# Patient Record
Sex: Female | Born: 1935 | Race: White | Hispanic: No | Smoking: Current every day smoker
Health system: Southern US, Community
[De-identification: ages and names within clinical notes are randomized; demographics above are authoritative.]

## PROBLEM LIST (undated history)

## (undated) DIAGNOSIS — M1990 Unspecified osteoarthritis, unspecified site: Secondary | ICD-10-CM

## (undated) DIAGNOSIS — F419 Anxiety disorder, unspecified: Secondary | ICD-10-CM

## (undated) DIAGNOSIS — R519 Headache, unspecified: Secondary | ICD-10-CM

## (undated) DIAGNOSIS — F0390 Unspecified dementia without behavioral disturbance: Secondary | ICD-10-CM

## (undated) DIAGNOSIS — I10 Essential (primary) hypertension: Secondary | ICD-10-CM

## (undated) DIAGNOSIS — R51 Headache: Secondary | ICD-10-CM

## (undated) DIAGNOSIS — F329 Major depressive disorder, single episode, unspecified: Secondary | ICD-10-CM

## (undated) DIAGNOSIS — R251 Tremor, unspecified: Secondary | ICD-10-CM

## (undated) DIAGNOSIS — N189 Chronic kidney disease, unspecified: Secondary | ICD-10-CM

## (undated) HISTORY — PX: PTERYGIUM EXCISION: SHX2273

## (undated) HISTORY — PX: ABDOMINAL HYSTERECTOMY: SHX81

## (undated) HISTORY — PX: HEMORROIDECTOMY: SUR656

## (undated) HISTORY — PX: FOOT SURGERY: SHX648

---

## 2008-06-01 ENCOUNTER — Emergency Department (HOSPITAL_BASED_OUTPATIENT_CLINIC_OR_DEPARTMENT_OTHER): Admission: EM | Admit: 2008-06-01 | Discharge: 2008-06-01 | Payer: Self-pay | Admitting: Emergency Medicine

## 2009-08-24 ENCOUNTER — Ambulatory Visit: Payer: Self-pay | Admitting: Diagnostic Radiology

## 2009-08-24 ENCOUNTER — Encounter: Payer: Self-pay | Admitting: Family Medicine

## 2009-08-25 ENCOUNTER — Observation Stay (HOSPITAL_COMMUNITY): Admission: EM | Admit: 2009-08-25 | Discharge: 2009-08-25 | Payer: Self-pay | Admitting: Internal Medicine

## 2010-10-13 LAB — CBC
Hemoglobin: 13 g/dL (ref 12.0–15.0)
MCHC: 34.3 g/dL (ref 30.0–36.0)
RBC: 4.31 MIL/uL (ref 3.87–5.11)
WBC: 8.4 10*3/uL (ref 4.0–10.5)

## 2010-10-13 LAB — POCT CARDIAC MARKERS
CKMB, poc: <1 ng/mL — ABNORMAL LOW (ref 1.0–8.0)
Myoglobin, poc: 49.9 ng/mL (ref 12–200)
Myoglobin, poc: 66.6 ng/mL (ref 12–200)
Troponin i, poc: <0.05 ng/mL (ref 0.00–0.09)

## 2010-10-13 LAB — BASIC METABOLIC PANEL
CO2: 30 mEq/L (ref 19–32)
Calcium: 10.7 mg/dL — ABNORMAL HIGH (ref 8.4–10.5)
Creatinine, Ser: 1 mg/dL (ref 0.4–1.2)
Glucose, Bld: 106 mg/dL — ABNORMAL HIGH (ref 70–99)

## 2010-10-14 LAB — LIPID PANEL
HDL: 68 mg/dL (ref >39)
LDL Cholesterol: 144 mg/dL — ABNORMAL HIGH (ref 0–99)
Triglycerides: 96 mg/dL (ref <150)
VLDL: 19 mg/dL (ref 0–40)

## 2010-10-14 LAB — CK TOTAL AND CKMB (NOT AT ARMC)
Relative Index: INVALID (ref 0.0–2.5)
Relative Index: INVALID (ref 0.0–2.5)
Relative Index: INVALID (ref 0.0–2.5)
Total CK: 24 U/L (ref 7–177)
Total CK: 27 U/L (ref 7–177)

## 2010-10-14 LAB — CBC
MCHC: 34 g/dL (ref 30.0–36.0)
MCV: 90.4 fL (ref 78.0–100.0)
Platelets: 287 10*3/uL (ref 150–400)
RBC: 4 MIL/uL (ref 3.87–5.11)
WBC: 6.9 10*3/uL (ref 4.0–10.5)

## 2010-10-14 LAB — BASIC METABOLIC PANEL
BUN: 16 mg/dL (ref 6–23)
CO2: 30 mEq/L (ref 19–32)
Calcium: 10.2 mg/dL (ref 8.4–10.5)
Creatinine, Ser: 0.92 mg/dL (ref 0.4–1.2)
GFR calc Af Amer: >60 mL/min (ref >60)

## 2010-10-14 LAB — TROPONIN I
Troponin I: 0.02 ng/mL (ref 0.00–0.06)
Troponin I: 0.02 ng/mL (ref 0.00–0.06)

## 2010-10-14 LAB — TSH: TSH: 1.051 u[IU]/mL (ref 0.350–4.500)

## 2011-04-29 LAB — POCT TOXICOLOGY PANEL

## 2012-01-31 ENCOUNTER — Emergency Department (HOSPITAL_COMMUNITY)
Admission: EM | Admit: 2012-01-31 | Discharge: 2012-02-01 | Disposition: A | Discharge status: Skilled Nursing Facility | Payer: Medicare Other | Attending: Emergency Medicine | Admitting: Emergency Medicine

## 2012-01-31 ENCOUNTER — Encounter (HOSPITAL_COMMUNITY): Payer: Self-pay | Admitting: Emergency Medicine

## 2012-01-31 DIAGNOSIS — S0003XA Contusion of scalp, initial encounter: Secondary | ICD-10-CM | POA: Insufficient documentation

## 2012-01-31 DIAGNOSIS — S1093XA Contusion of unspecified part of neck, initial encounter: Secondary | ICD-10-CM | POA: Insufficient documentation

## 2012-01-31 DIAGNOSIS — I10 Essential (primary) hypertension: Secondary | ICD-10-CM | POA: Insufficient documentation

## 2012-01-31 DIAGNOSIS — M1990 Unspecified osteoarthritis, unspecified site: Secondary | ICD-10-CM | POA: Insufficient documentation

## 2012-01-31 DIAGNOSIS — Z79899 Other long term (current) drug therapy: Secondary | ICD-10-CM | POA: Insufficient documentation

## 2012-01-31 DIAGNOSIS — M542 Cervicalgia: Secondary | ICD-10-CM | POA: Insufficient documentation

## 2012-01-31 DIAGNOSIS — W010XXA Fall on same level from slipping, tripping and stumbling without subsequent striking against object, initial encounter: Secondary | ICD-10-CM | POA: Insufficient documentation

## 2012-01-31 DIAGNOSIS — S0990XA Unspecified injury of head, initial encounter: Secondary | ICD-10-CM | POA: Insufficient documentation

## 2012-01-31 DIAGNOSIS — Y9210 Unspecified residential institution as the place of occurrence of the external cause: Secondary | ICD-10-CM | POA: Insufficient documentation

## 2012-01-31 DIAGNOSIS — F0390 Unspecified dementia without behavioral disturbance: Secondary | ICD-10-CM | POA: Insufficient documentation

## 2012-01-31 DIAGNOSIS — S20229A Contusion of unspecified back wall of thorax, initial encounter: Secondary | ICD-10-CM

## 2012-01-31 HISTORY — DX: Anxiety disorder, unspecified: F41.9

## 2012-01-31 HISTORY — DX: Unspecified dementia, unspecified severity, without behavioral disturbance, psychotic disturbance, mood disturbance, and anxiety: F03.90

## 2012-01-31 HISTORY — DX: Major depressive disorder, single episode, unspecified: F32.9

## 2012-01-31 HISTORY — DX: Essential (primary) hypertension: I10

## 2012-01-31 HISTORY — DX: Unspecified osteoarthritis, unspecified site: M19.90

## 2012-01-31 HISTORY — DX: Headache, unspecified: R51.9

## 2012-01-31 HISTORY — DX: Tremor, unspecified: R25.1

## 2012-01-31 HISTORY — DX: Headache: R51

## 2012-01-31 NOTE — ED Notes (Signed)
YQM:[id number]<ON> Expected date:01/31/12<BR> Expected time:10:18 PM<BR> Means of arrival:Ambulance<BR> Comments:<BR> Fall, head injury

## 2012-01-31 NOTE — ED Notes (Signed)
Per EMS pt transported from [facility] after being found on ground against wall, pt noted to have hematoma to back of head. Pt in KED by EMS, towel roll in place for cspine immobilization. A & O

## 2012-02-01 ENCOUNTER — Emergency Department (HOSPITAL_COMMUNITY): Payer: Medicare Other

## 2012-02-01 MED ORDER — KETOROLAC TROMETHAMINE 60 MG/2ML IM SOLN
60.0000 mg | Freq: Once | INTRAMUSCULAR | Status: AC
  Administered 2012-02-01: 60 mg via INTRAMUSCULAR
  Filled 2012-02-01: qty 2

## 2012-02-01 MED ORDER — NAPROXEN 500 MG PO TABS: 500.0000 mg | ORAL_TABLET | Freq: Two times a day (BID) | ORAL | Status: AC

## 2012-02-01 NOTE — ED Notes (Signed)
Pt ambulates with assistance of one, MD made aware

## 2012-02-01 NOTE — ED Notes (Signed)
Pt states she was out of bed to close blinds, tripped and fell against cement wall, hematoma noted to back of head. Pt denies LOC. Pt awake, able to answer questions. C/o head and back pain

## 2012-02-01 NOTE — ED Provider Notes (Signed)
History     CSN: [id number]  Arrival date & time 01/31/12  2222   First MD Initiated Contact with Patient 02/01/12 [phone]      Chief Complaint  Patient presents with  . Fall    (Consider location/radiation/quality/duration/timing/severity/associated sxs/prior treatment) HPI Comments: Patient is an elderly 76 year old female with a history of dementia, osteoarthritis and hypertension who presents after slipping and falling backwards striking her head and mid back on a cement wall and then falling to the floor. She states this was a mechanical, she lost her balance, was able to call for help. She was found on the floor of her assisted living facility able to use both her arms and her legs but having too much pain to get up off the floor. This was acute in onset, pain has been persistent, she denies numbness or weakness of her arms or legs or change in vision. She was immobilized by paramedics with a c-collar and a modified backboard  Patient is a 76 y.o. female presenting with fall. The history is provided by the patient, medical records and the EMS personnel.  Fall    Past Medical History  Diagnosis Date  . Dementia   . Anxiety   . MDD (major depressive disorder)   . Hypertension   . Head ache   . Tremor   . Osteoarthritis     Past Surgical History  Procedure Date  . Abdominal hysterectomy   . Hemorroidectomy   . Pterygium excision   . Foot surgery     right    No family history on file.  History  Substance Use Topics  . Smoking status: Unknown If Ever Smoked  . Smokeless tobacco: Not on file  . Alcohol Use: No    OB History    Grav Para Term Preterm Abortions TAB SAB Ect Mult Living                  Review of Systems  Unable to perform ROS: Dementia    Allergies  Review of patient's allergies indicates no known allergies.  Home Medications   Current Outpatient Rx  Name Route Sig Dispense Refill  . ACETAMINOPHEN 500 MG PO TABS Oral Take 500 mg by mouth  every 4 (four) hours as needed. For pain    . ALUM & MAG HYDROXIDE-SIMETH 200-200-20 MG/5ML PO SUSP Oral Take 30 mLs by mouth every 6 (six) hours as needed. For indigestion    . ARIPIPRAZOLE 2 MG PO TABS Oral Take 2 mg by mouth daily.    Marland Kitchen BACLOFEN 10 MG PO TABS Oral Take 10 mg by mouth daily.    . CELECOXIB 200 MG PO CAPS Oral Take 200 mg by mouth daily.    Marland Kitchen CLONAZEPAM 0.5 MG PO TABS Oral Take 0.5 mg by mouth 4 (four) times daily.    Marland Kitchen DOCUSATE SODIUM 100 MG PO CAPS Oral Take 100 mg by mouth daily as needed. For constipation    . DONEPEZIL HCL 5 MG PO TABS Oral Take 5 mg by mouth daily.    . DULOXETINE HCL 30 MG PO CPEP Oral Take 90 mg by mouth daily.    . GUAIFENESIN-CODEINE 100-10 MG/5ML PO SYRP Oral Take 10 mLs by mouth 4 (four) times daily as needed.    Marland Kitchen LAMOTRIGINE 100 MG PO TABS Oral Take 100 mg by mouth daily.    Marland Kitchen LOSARTAN POTASSIUM-HCTZ 50-12.5 MG PO TABS Oral Take 1 tablet by mouth daily.    . MECLIZINE HCL  25 MG PO TABS Oral Take 25 mg by mouth 3 (three) times daily as needed. For dizziness    . MEMANTINE HCL 10 MG PO TABS Oral Take 10 mg by mouth daily.    Marland Kitchen VITAMIN D (ERGOCALCIFEROL) 50000 UNITS PO CAPS Oral Take 50,000 Units by mouth every 7 (seven) days.    Marland Kitchen NAPROXEN 500 MG PO TABS Oral Take 1 tablet (500 mg total) by mouth 2 (two) times daily with a meal. 30 tablet 0    BP 129/55  Pulse 69  Temp 97.8 F (36.6 C) (Oral)  Resp 14  SpO2 93%  Physical Exam  Nursing note and vitals reviewed. Constitutional: She appears well-developed and well-nourished. No distress.  HENT:  Head: Normocephalic.  Mouth/Throat: Oropharynx is clear and moist. No oropharyngeal exudate.       Small hematoma to the posterior occiput, tympanic membranes clear, no hemotympanum, no raccoon eyes, no battle sign  Eyes: Conjunctivae and EOM are normal. Pupils are equal, round, and reactive to light. Right eye exhibits no discharge. Left eye exhibits no discharge. No scleral icterus.  Neck: No JVD  present.  Cardiovascular: Normal rate, regular rhythm, normal heart sounds and intact distal pulses.  Exam reveals no gallop and no friction rub.   No murmur heard. Pulmonary/Chest: Effort normal and breath sounds normal. No respiratory distress. She has no wheezes. She has no rales.  Abdominal: Soft. Bowel sounds are normal. She exhibits no distension and no mass. There is no tenderness.  Musculoskeletal: Normal range of motion. She exhibits tenderness ( Mild tenderness over the cervical spine). She exhibits no edema.  Lymphadenopathy:    She has no cervical adenopathy.  Neurological: She is alert. Coordination normal.       Alert, follows commands without difficulty, normal strength and sensation of the bilateral upper and lower extremities.  Skin: Skin is warm and dry. No rash noted. No erythema.  Psychiatric: She has a normal mood and affect. Her behavior is normal.    ED Course  Procedures (including critical care time)  Labs Reviewed - No data to display Ct Head Wo Contrast  02/01/2012  *RADIOLOGY REPORT*  Clinical Data:  Status post fall; scalp hematoma at the back of the head.  Headache and neck pain.  CT HEAD WITHOUT CONTRAST AND CT CERVICAL SPINE WITHOUT CONTRAST  Technique:  Multidetector CT imaging of the head and cervical spine was performed following the standard protocol without intravenous contrast.  Multiplanar CT image reconstructions of the cervical spine were also generated.  Comparison: None  CT HEAD  Findings: There is no evidence of acute infarction, mass lesion, or intra- or extra-axial hemorrhage on CT.  The posterior fossa, including the cerebellum, brainstem and fourth ventricle, is within normal limits.  The third and lateral ventricles, and basal ganglia are unremarkable in appearance.  The cerebral hemispheres are symmetric in appearance, with normal gray- white differentiation.  No mass effect or midline shift is seen.  There is no evidence of fracture; visualized  osseous structures are unremarkable in appearance.  The visualized portions of the orbits are within normal limits.  The paranasal sinuses and mastoid air cells are well-aerated.  Mild soft tissue swelling is noted at the occiput.  IMPRESSION:  1.  No evidence of traumatic intracranial injury or fracture. 2.  Mild soft tissue swelling noted at the occiput.  CT CERVICAL SPINE  Findings: There is no evidence of fracture or subluxation. Vertebral bodies demonstrate normal height and alignment.  There is  narrowing of the intervertebral disc space at C4-C5, C5-C6 and C6- C7, with small anterior and posterior disc osteophyte complexes. Prevertebral soft tissues are within normal limits.  The visualized portions of the thyroid gland are unremarkable in appearance.  The minimally visualized lung apices are clear.  No significant soft tissue abnormalities are seen.  IMPRESSION:  1.  No evidence of fracture or subluxation along the cervical spine. 2.  Mild degenerative change noted along the lower cervical spine.  Original Report Authenticated By: Tonia Ghent, M.D.   Ct Cervical Spine Wo Contrast  02/01/2012  *RADIOLOGY REPORT*  Clinical Data:  Status post fall; scalp hematoma at the back of the head.  Headache and neck pain.  CT HEAD WITHOUT CONTRAST AND CT CERVICAL SPINE WITHOUT CONTRAST  Technique:  Multidetector CT imaging of the head and cervical spine was performed following the standard protocol without intravenous contrast.  Multiplanar CT image reconstructions of the cervical spine were also generated.  Comparison: None  CT HEAD  Findings: There is no evidence of acute infarction, mass lesion, or intra- or extra-axial hemorrhage on CT.  The posterior fossa, including the cerebellum, brainstem and fourth ventricle, is within normal limits.  The third and lateral ventricles, and basal ganglia are unremarkable in appearance.  The cerebral hemispheres are symmetric in appearance, with normal gray- white  differentiation.  No mass effect or midline shift is seen.  There is no evidence of fracture; visualized osseous structures are unremarkable in appearance.  The visualized portions of the orbits are within normal limits.  The paranasal sinuses and mastoid air cells are well-aerated.  Mild soft tissue swelling is noted at the occiput.  IMPRESSION:  1.  No evidence of traumatic intracranial injury or fracture. 2.  Mild soft tissue swelling noted at the occiput.  CT CERVICAL SPINE  Findings: There is no evidence of fracture or subluxation. Vertebral bodies demonstrate normal height and alignment.  There is narrowing of the intervertebral disc space at C4-C5, C5-C6 and C6- C7, with small anterior and posterior disc osteophyte complexes. Prevertebral soft tissues are within normal limits.  The visualized portions of the thyroid gland are unremarkable in appearance.  The minimally visualized lung apices are clear.  No significant soft tissue abnormalities are seen.  IMPRESSION:  1.  No evidence of fracture or subluxation along the cervical spine. 2.  Mild degenerative change noted along the lower cervical spine.  Original Report Authenticated By: Tonia Ghent, M.D.     1. Head injury   2. Contusion of back       MDM  Maintaining cervical immobilization, imaging of head and neck, no reproducible tenderness over the thoracic or lumbar spines. Neurologic status is intact, pain medication ordered  Patient is ambulated with minimal difficulty, has CT scan showing no signs of intracranial injury or cervical spine injury. Patient neurologically intact and amenable to discharge, lives in assisted care facility, axis to help as needed.      Vida Roller, MD 02/01/12 [phone]

## 2012-02-01 NOTE — ED Notes (Signed)
[facility] contacted for transport 

## 2012-11-03 ENCOUNTER — Other Ambulatory Visit: Payer: Self-pay

## 2012-11-03 DIAGNOSIS — Z1231 Encounter for screening mammogram for malignant neoplasm of breast: Secondary | ICD-10-CM

## 2012-11-16 ENCOUNTER — Ambulatory Visit: Payer: Medicare Other

## 2013-10-18 ENCOUNTER — Emergency Department (HOSPITAL_COMMUNITY)
Admission: EM | Admit: 2013-10-18 | Discharge: 2013-10-18 | Disposition: A | Discharge status: Home / Self Care | Payer: Medicare Other | Attending: Emergency Medicine | Admitting: Emergency Medicine

## 2013-10-18 ENCOUNTER — Emergency Department (HOSPITAL_COMMUNITY): Payer: Medicare Other

## 2013-10-18 ENCOUNTER — Encounter (HOSPITAL_COMMUNITY): Payer: Self-pay | Admitting: Emergency Medicine

## 2013-10-18 DIAGNOSIS — F411 Generalized anxiety disorder: Secondary | ICD-10-CM | POA: Insufficient documentation

## 2013-10-18 DIAGNOSIS — Z7901 Long term (current) use of anticoagulants: Secondary | ICD-10-CM | POA: Insufficient documentation

## 2013-10-18 DIAGNOSIS — Y9389 Activity, other specified: Secondary | ICD-10-CM | POA: Insufficient documentation

## 2013-10-18 DIAGNOSIS — F329 Major depressive disorder, single episode, unspecified: Secondary | ICD-10-CM | POA: Insufficient documentation

## 2013-10-18 DIAGNOSIS — W19XXXA Unspecified fall, initial encounter: Secondary | ICD-10-CM

## 2013-10-18 DIAGNOSIS — S59919A Unspecified injury of unspecified forearm, initial encounter: Principal | ICD-10-CM

## 2013-10-18 DIAGNOSIS — Z79899 Other long term (current) drug therapy: Secondary | ICD-10-CM | POA: Insufficient documentation

## 2013-10-18 DIAGNOSIS — Y9210 Unspecified residential institution as the place of occurrence of the external cause: Secondary | ICD-10-CM | POA: Insufficient documentation

## 2013-10-18 DIAGNOSIS — S59909A Unspecified injury of unspecified elbow, initial encounter: Secondary | ICD-10-CM | POA: Insufficient documentation

## 2013-10-18 DIAGNOSIS — I10 Essential (primary) hypertension: Secondary | ICD-10-CM | POA: Insufficient documentation

## 2013-10-18 DIAGNOSIS — S6990XA Unspecified injury of unspecified wrist, hand and finger(s), initial encounter: Principal | ICD-10-CM | POA: Insufficient documentation

## 2013-10-18 DIAGNOSIS — R296 Repeated falls: Secondary | ICD-10-CM | POA: Insufficient documentation

## 2013-10-18 DIAGNOSIS — Z7983 Long term (current) use of bisphosphonates: Secondary | ICD-10-CM | POA: Insufficient documentation

## 2013-10-18 DIAGNOSIS — M25521 Pain in right elbow: Secondary | ICD-10-CM

## 2013-10-18 DIAGNOSIS — M1990 Unspecified osteoarthritis, unspecified site: Secondary | ICD-10-CM | POA: Insufficient documentation

## 2013-10-18 DIAGNOSIS — F0390 Unspecified dementia without behavioral disturbance: Secondary | ICD-10-CM | POA: Insufficient documentation

## 2013-10-18 NOTE — ED Notes (Signed)
[facility] called for transport back to SNF.  Report given to nurse at the SNF by phone.

## 2013-10-18 NOTE — ED Notes (Signed)
Bed: WA08 Expected date:  Expected time:  Means of arrival:  Comments: EMS-fall-right elbow pain

## 2013-10-18 NOTE — ED Provider Notes (Signed)
TIME SEEN: 1:21 PM  CHIEF COMPLAINT: Right elbow pain  HPI: Patient is a 78 year old right-hand-dominant female with a history of mild dementia, hypertension who lives in a nursing facility who reports that today at her nursing facility she was stating he began to lean back against a door when she fell and landed on her right elbow. She states that she miscalculated and thought the door was closer to her than it really was. She denies hitting her head or losing consciousness. She is on anticoagulation. Denies that she had any chest pain, shortness of breath, palpitations or dizziness that led to her fall. Her recent infectious symptoms. She is only complaining of very mild right elbow pain.   ROS: See HPI Constitutional: no fever  Eyes: no drainage  ENT: no runny nose   Cardiovascular:  no chest pain  Resp: no SOB  GI: no vomiting GU: no dysuria Integumentary: no rash  Allergy: no hives  Musculoskeletal: no leg swelling  Neurological: no slurred speech ROS otherwise negative  PAST MEDICAL HISTORY/PAST SURGICAL HISTORY:  Past Medical History  Diagnosis Date  . Dementia   . Anxiety   . MDD (major depressive disorder)   . Hypertension   . Head ache   . Tremor   . Osteoarthritis     MEDICATIONS:  Prior to Admission medications   Medication Sig Start Date End Date Taking? Authorizing Provider  acetaminophen (TYLENOL) 500 MG tablet Take 500 mg by mouth every 4 (four) hours as needed. For pain   Yes Historical Provider, MD  alendronate (FOSAMAX) 70 MG tablet Take 70 mg by mouth once a week. Take with a full glass of water on an empty stomach. Takes on Mondays   Yes Historical Provider, MD  alum & mag hydroxide-simeth (MAALOX/MYLANTA) 200-200-20 MG/5ML suspension Take 30 mLs by mouth every 6 (six) hours as needed. For indigestion   Yes Historical Provider, MD  Calcium Carbonate-Vitamin D (CALCARB 600/D) 600-400 MG-UNIT per tablet Take 1 tablet by mouth daily.   Yes Historical Provider,  MD  clonazePAM (KLONOPIN) 1 MG tablet Take 1 mg by mouth daily.   Yes Historical Provider, MD  Dextromethorphan-Quinidine (NUEDEXTA) 20-10 MG CAPS Take 1 capsule by mouth every 12 (twelve) hours.   Yes Historical Provider, MD  divalproex (DEPAKOTE) 250 MG DR tablet Take 250 mg by mouth 3 (three) times daily. 250mg  in the morning, 250mg  at 2pm, and 250mg  in the evening   Yes Historical Provider, MD  docusate sodium (COLACE) 100 MG capsule Take 100 mg by mouth 2 (two) times daily. For constipation   Yes Historical Provider, MD  DULoxetine (CYMBALTA) 60 MG capsule Take 60 mg by mouth 2 (two) times daily.   Yes Historical Provider, MD  gabapentin (NEURONTIN) 600 MG tablet Take 600 mg by mouth 4 (four) times daily.   Yes Historical Provider, MD  guaiFENesin-codeine (IOPHEN C-NR) 100-10 MG/5ML syrup Take 10 mLs by mouth 4 (four) times daily as needed.   Yes Historical Provider, MD  HYDROcodone-acetaminophen (NORCO/VICODIN) 5-325 MG per tablet Take 1 tablet by mouth 4 (four) times daily as needed for moderate pain.   Yes Historical Provider, MD  loperamide (IMODIUM) 2 MG capsule Take 2 mg by mouth as needed for diarrhea or loose stools.   Yes Historical Provider, MD  losartan-hydrochlorothiazide (HYZAAR) 50-12.5 MG per tablet Take 1 tablet by mouth daily.   Yes Historical Provider, MD  lurasidone (LATUDA) 40 MG TABS tablet Take 40 mg by mouth daily with breakfast.  Yes Historical Provider, MD  magnesium hydroxide (MILK OF MAGNESIA) 400 MG/5ML suspension Take 30 mLs by mouth daily as needed for mild constipation.   Yes Historical Provider, MD  Melatonin 3 MG TABS Take 3 mg by mouth at bedtime.   Yes Historical Provider, MD  Memantine HCl ER (NAMENDA XR) 28 MG CP24 Take 28 mg by mouth daily.   Yes Historical Provider, MD  naproxen (NAPROSYN) 500 MG tablet Take 500 mg by mouth 2 (two) times daily as needed for mild pain.   Yes Historical Provider, MD  omeprazole (PRILOSEC) 20 MG capsule Take 20 mg by mouth  daily after breakfast.   Yes Historical Provider, MD  QUEtiapine (SEROQUEL) 25 MG tablet Take 25 mg by mouth at bedtime.   Yes Historical Provider, MD  traMADol (ULTRAM) 50 MG tablet Take 50 mg by mouth every 6 (six) hours as needed for moderate pain.   Yes Historical Provider, MD    ALLERGIES:  No Known Allergies  SOCIAL HISTORY:  History  Substance Use Topics  . Smoking status: Unknown If Ever Smoked  . Smokeless tobacco: Not on file  . Alcohol Use: No    FAMILY HISTORY: History reviewed. No pertinent family history.  EXAM: BP 120/50  Pulse 82  Temp(Src) 98.3 F (36.8 C) (Oral)  Resp 16  SpO2 95% CONSTITUTIONAL: Alert and oriented x3 and responds appropriately to questions. Well-appearing; well-nourished HEAD: Normocephalic EYES: Conjunctivae clear, PERRL ENT: normal nose; no rhinorrhea; moist mucous membranes; pharynx without lesions noted NECK: Supple, no meningismus, no LAD  CARD: RRR; S1 and S2 appreciated; no murmurs, no clicks, no rubs, no gallops RESP: Normal chest excursion without splinting or tachypnea; breath sounds clear and equal bilaterally; no wheezes, no rhonchi, no rales,  ABD/GI: Normal bowel sounds; non-distended; soft, non-tender, no rebound, no guarding BACK:  The back appears normal and is non-tender to palpation, there is no CVA tenderness EXT: Normal ROM in all joints; non-tender to palpation; no edema; normal capillary refill; no cyanosis; small abrasion to the posterior right elbow with no joint effusion, no tenderness over the radial head, no scaphoid tenderness, full range of motion in all joints, 2+ radial pulses bilaterally SKIN: Normal color for age and race; warm NEURO: Moves all extremities equally, sensation to light touch intact diffusely, cranial nerves II through XII intact PSYCH: The patient's mood and manner are appropriate. Grooming and personal hygiene are appropriate.  MEDICAL DECISION MAKING: Patient here with mechanical fall with  right elbow pain. She is neurologically intact and hemodynamically stable. We'll obtain an x-ray of her right elbow. Patient denies wanting pain medication.   ED PROGRESS: Patient's x-ray shows no fracture dislocation. We'll discharge patient back to her nursing facility. Discussed supportive care instructions and return precautions. Patient verbalizes understanding and is comfortable with plan.     [name] MawKristen N Khiyan Crace, DO 10/18/13 1428

## 2013-10-18 NOTE — Discharge Instructions (Signed)
RICE: Routine Care for Injuries The routine care of many injuries includes Rest, Ice, Compression, and Elevation (RICE). HOME CARE INSTRUCTIONS  Rest is needed to allow your body to heal. Routine activities can usually be resumed when comfortable. Injured tendons and bones can take up to 6 weeks to heal. Tendons are the cord-like structures that attach muscle to bone.  Ice following an injury helps keep the swelling down and reduces pain.  Put ice in a plastic bag.  Place a towel between your skin and the bag.  Leave the ice on for 15-20 minutes, 03-04 times a day. Do this while awake, for the first 24 to 48 hours. After that, continue as directed by your caregiver.  Compression helps keep swelling down. It also gives support and helps with discomfort. If an elastic bandage has been applied, it should be removed and reapplied every 3 to 4 hours. It should not be applied tightly, but firmly enough to keep swelling down. Watch fingers or toes for swelling, bluish discoloration, coldness, numbness, or excessive pain. If any of these problems occur, remove the bandage and reapply loosely. Contact your caregiver if these problems continue.  Elevation helps reduce swelling and decreases pain. With extremities, such as the arms, hands, legs, and feet, the injured area should be placed near or above the level of the heart, if possible. SEEK IMMEDIATE MEDICAL CARE IF:  You have persistent pain and swelling.  You develop redness, numbness, or unexpected weakness.  Your symptoms are getting worse rather than improving after several days. These symptoms may indicate that further evaluation or further X-rays are needed. Sometimes, X-rays may not show a small broken bone (fracture) until 1 week or 10 days later. Make a follow-up appointment with your caregiver. Ask when your X-ray results will be ready. Make sure you get your X-ray results. Document Released: 10/26/2000 Document Revised: 10/06/2011  Document Reviewed: 12/13/2010 [facility] Patient Information 2014 [place], [place]. Contusion A contusion is a deep bruise. Contusions are the result of an injury that caused bleeding under the skin. The contusion may turn blue, purple, or yellow. Minor injuries will give you a painless contusion, but more severe contusions may stay painful and swollen for a few weeks.  CAUSES  A contusion is usually caused by a blow, trauma, or direct force to an area of the body. SYMPTOMS   Swelling and redness of the injured area.  Bruising of the injured area.  Tenderness and soreness of the injured area.  Pain. DIAGNOSIS  The diagnosis can be made by taking a history and physical exam. An X-ray, CT scan, or MRI may be needed to determine if there were any associated injuries, such as fractures. TREATMENT  Specific treatment will depend on what area of the body was injured. In general, the best treatment for a contusion is resting, icing, elevating, and applying cold compresses to the injured area. Over-the-counter medicines may also be recommended for pain control. Ask your caregiver what the best treatment is for your contusion. HOME CARE INSTRUCTIONS   Put ice on the injured area.  Put ice in a plastic bag.  Place a towel between your skin and the bag.  Leave the ice on for 15-20 minutes, 03-04 times a day.  Only take over-the-counter or prescription medicines for pain, discomfort, or fever as directed by your caregiver. Your caregiver may recommend avoiding anti-inflammatory medicines (aspirin, ibuprofen, and naproxen) for 48 hours because these medicines may increase bruising.  Rest the injured area.  If  possible, elevate the injured area to reduce swelling. SEEK IMMEDIATE MEDICAL CARE IF:   You have increased bruising or swelling.  You have pain that is getting worse.  Your swelling or pain is not relieved with medicines. MAKE SURE YOU:   Understand these instructions.  Will watch  your condition.  Will get help right away if you are not doing well or get worse. Document Released: 04/23/2005 Document Revised: 10/06/2011 Document Reviewed: 05/19/2011 [facility] Patient Information 2014 [place].

## 2013-10-18 NOTE — ED Notes (Signed)
Pt walked with 1 person assist for balance to bathroom.  NAD noted and pt was steady on feet.

## 2013-10-18 NOTE — ED Notes (Signed)
[facility]  Per EMS report, pt was walking out fo her room, thought she had closed the door and leaned back on it but it wasn't there to support her and she fell back on her RT elbow.  No LOC or hitting of head.  Pt didn't want to come to ED.  VS: 120/70, 80reg, 16, 100%RA, CBG 128.

## 2013-10-31 ENCOUNTER — Emergency Department (HOSPITAL_COMMUNITY)
Admission: EM | Admit: 2013-10-31 | Discharge: 2013-10-31 | Disposition: A | Discharge status: Home / Self Care | Payer: Medicare Other | Attending: Emergency Medicine | Admitting: Emergency Medicine

## 2013-10-31 ENCOUNTER — Emergency Department (HOSPITAL_COMMUNITY): Payer: Medicare Other

## 2013-10-31 ENCOUNTER — Encounter (HOSPITAL_COMMUNITY): Payer: Self-pay | Admitting: Emergency Medicine

## 2013-10-31 DIAGNOSIS — F0390 Unspecified dementia without behavioral disturbance: Secondary | ICD-10-CM | POA: Insufficient documentation

## 2013-10-31 DIAGNOSIS — M1990 Unspecified osteoarthritis, unspecified site: Secondary | ICD-10-CM | POA: Insufficient documentation

## 2013-10-31 DIAGNOSIS — W010XXA Fall on same level from slipping, tripping and stumbling without subsequent striking against object, initial encounter: Secondary | ICD-10-CM | POA: Insufficient documentation

## 2013-10-31 DIAGNOSIS — S0181XA Laceration without foreign body of other part of head, initial encounter: Secondary | ICD-10-CM

## 2013-10-31 DIAGNOSIS — Y9210 Unspecified residential institution as the place of occurrence of the external cause: Secondary | ICD-10-CM | POA: Insufficient documentation

## 2013-10-31 DIAGNOSIS — I10 Essential (primary) hypertension: Secondary | ICD-10-CM | POA: Insufficient documentation

## 2013-10-31 DIAGNOSIS — F329 Major depressive disorder, single episode, unspecified: Secondary | ICD-10-CM | POA: Insufficient documentation

## 2013-10-31 DIAGNOSIS — S01409A Unspecified open wound of unspecified cheek and temporomandibular area, initial encounter: Secondary | ICD-10-CM | POA: Insufficient documentation

## 2013-10-31 DIAGNOSIS — Y9389 Activity, other specified: Secondary | ICD-10-CM | POA: Insufficient documentation

## 2013-10-31 DIAGNOSIS — F411 Generalized anxiety disorder: Secondary | ICD-10-CM | POA: Insufficient documentation

## 2013-10-31 DIAGNOSIS — Z79899 Other long term (current) drug therapy: Secondary | ICD-10-CM | POA: Insufficient documentation

## 2013-10-31 LAB — I-STAT CHEM 8, ED
BUN: 9 mg/dL (ref 6–23)
CALCIUM ION: 1.3 mmol/L (ref 1.13–1.30)
Chloride: 93 mEq/L — ABNORMAL LOW (ref 96–112)
Creatinine, Ser: 0.9 mg/dL (ref 0.50–1.10)
GLUCOSE: 102 mg/dL — AB (ref 70–99)
HCT: 38 % (ref 36.0–46.0)
HEMOGLOBIN: 12.9 g/dL (ref 12.0–15.0)
Potassium: 3.9 mEq/L (ref 3.7–5.3)
Sodium: 130 mEq/L — ABNORMAL LOW (ref 137–147)
TCO2: 24 mmol/L (ref 0–100)

## 2013-10-31 MED ORDER — IOHEXOL 350 MG/ML SOLN
100.0000 mL | Freq: Once | INTRAVENOUS | Status: AC | PRN
  Administered 2013-10-31: 100 mL via INTRAVENOUS

## 2013-10-31 MED ORDER — HYDROCODONE-ACETAMINOPHEN 5-325 MG PO TABS
1.0000 | ORAL_TABLET | Freq: Once | ORAL | Status: AC
  Administered 2013-10-31: 1 via ORAL
  Filled 2013-10-31: qty 1

## 2013-10-31 NOTE — Discharge Instructions (Signed)
Facial Laceration ° A facial laceration is a cut on the face. These injuries can be painful and cause bleeding. Lacerations usually heal quickly, but they need special care to reduce scarring. °DIAGNOSIS  °Your health care provider will take a medical history, ask for details about how the injury occurred, and examine the wound to determine how deep the cut is. °TREATMENT  °Some facial lacerations may not require closure. Others may not be able to be closed because of an increased risk of infection. The risk of infection and the chance for successful closure will depend on various factors, including the amount of time since the injury occurred. °The wound may be cleaned to help prevent infection. If closure is appropriate, pain medicines may be given if needed. Your health care provider will use stitches (sutures), wound glue (adhesive), or skin adhesive strips to repair the laceration. These tools bring the skin edges together to allow for faster healing and a better cosmetic outcome. If needed, you may also be given a tetanus shot. °HOME CARE INSTRUCTIONS °· Only take over-the-counter or prescription medicines as directed by your health care provider. °· Follow your health care provider's instructions for wound care. These instructions will vary depending on the technique used for closing the wound. °For Sutures: °· Keep the wound clean and dry.   °· If you were given a bandage (dressing), you should change it at least once a day. Also change the dressing if it becomes wet or dirty, or as directed by your health care provider.   °· Wash the wound with soap and water 2 times a day. Rinse the wound off with water to remove all soap. Pat the wound dry with a clean towel.   °· After cleaning, apply a thin layer of the antibiotic ointment recommended by your health care provider. This will help prevent infection and keep the dressing from sticking.   °· You may shower as usual after the first 24 hours. Do not soak the  wound in water until the sutures are removed.   °· Get your sutures removed as directed by your health care provider. With facial lacerations, sutures should usually be taken out after 4 5 days to avoid stitch marks.   °· Wait a few days after your sutures are removed before applying any makeup. °For Skin Adhesive Strips: °· Keep the wound clean and dry.   °· Do not get the skin adhesive strips wet. You may bathe carefully, using caution to keep the wound dry.   °· If the wound gets wet, pat it dry with a clean towel.   °· Skin adhesive strips will fall off on their own. You may trim the strips as the wound heals. Do not remove skin adhesive strips that are still stuck to the wound. They will fall off in time.   °For Wound Adhesive: °· You may briefly wet your wound in the shower or bath. Do not soak or scrub the wound. Do not swim. Avoid periods of heavy sweating until the skin adhesive has fallen off on its own. After showering or bathing, gently pat the wound dry with a clean towel.   °· Do not apply liquid medicine, cream medicine, ointment medicine, or makeup to your wound while the skin adhesive is in place. This may loosen the film before your wound is healed.   °· If a dressing is placed over the wound, be careful not to apply tape directly over the skin adhesive. This may cause the adhesive to be pulled off before the wound is healed.   °·   Avoid prolonged exposure to sunlight or tanning lamps while the skin adhesive is in place. °· The skin adhesive will usually remain in place for 5 10 days, then naturally fall off the skin. Do not pick at the adhesive film.   °After Healing: °Once the wound has healed, cover the wound with sunscreen during the day for 1 full year. This can help minimize scarring. Exposure to ultraviolet light in the first year will darken the scar. It can take 1 2 years for the scar to lose its redness and to heal completely.  °SEEK IMMEDIATE MEDICAL CARE IF: °· You have redness, pain, or  swelling around the wound.   °· You see a yellowish-white fluid (pus) coming from the wound.   °· You have chills or a fever.   °MAKE SURE YOU: °· Understand these instructions. °· Will watch your condition. °· Will get help right away if you are not doing well or get worse. °Document Released: 08/21/2004 Document Revised: 05/04/2013 Document Reviewed: 02/24/2013 °[employer]® Patient Information ©2014 [employer]. ° °

## 2013-10-31 NOTE — ED Provider Notes (Signed)
Laceration repair:  LACERATION REPAIR Performed by: Elpidio AnisUPSTILL, [name] [name] Authorized by: Elpidio AnisUPSTILL, [name] [name] Consent: Verbal consent obtained. Risks and benefits: risks, benefits and alternatives were discussed Consent given by: patient Patient identity confirmed: provided demographic data Prepped and Draped in normal sterile fashion Wound explored  Laceration Location: rght anterior neck  Laceration Length: 4 cmcm  No Foreign Bodies seen or palpated  Anesthesia: local infiltration  Local anesthetic: lidocaine 2% w/ epinephrine  Anesthetic total: 2 ml  Irrigation method: syringe Amount of cleaning: standard  Skin closure: SQ Vicryl 6-0, dermabond  Number of sutures: 3  Technique: inverted, dermabond externally  Patient tolerance: Patient tolerated the procedure well with no immediate complications.   Arnoldo HookerShari [name] Janijah Symons, PA-C 10/31/13 1620

## 2013-10-31 NOTE — ED Notes (Signed)
Bed: WA21 Expected date:  Expected time:  Means of arrival:  Comments: ems- elderly, fall. laceration

## 2013-10-31 NOTE — ED Provider Notes (Signed)
CSN: [id number]     Arrival date & time 10/31/13  1155 History   First MD Initiated Contact with Patient 10/31/13 1215     Chief Complaint  Patient presents with  . Fall  . Facial Laceration    HPI Pt tripped over a trash can this morning and hit her face on the floor she thinks.  She sustained a laceration to her right lower cheek.  She did not lose consciousness.  Otherwise she feels fine.    She is not having any neck pain or other injuries.  She has not trouble closing her jaw.  She did not want to come to the ED but was sent in by the staff at her nursing facility. Past Medical History  Diagnosis Date  . Dementia   . Anxiety   . MDD (major depressive disorder)   . Hypertension   . Head ache   . Tremor   . Osteoarthritis    Past Surgical History  Procedure Laterality Date  . Abdominal hysterectomy    . Hemorroidectomy    . Pterygium excision    . Foot surgery      right   History reviewed. No pertinent family history. History  Substance Use Topics  . Smoking status: Unknown If Ever Smoked  . Smokeless tobacco: Not on file  . Alcohol Use: No   OB History   Grav Para Term Preterm Abortions TAB SAB Ect Mult Living                 Review of Systems  All other systems reviewed and are negative.      Allergies  Review of patient's allergies indicates no known allergies.  Home Medications   Current Outpatient Rx  Name  Route  Sig  Dispense  Refill  . alendronate (FOSAMAX) 70 MG tablet   Oral   Take 70 mg by mouth once a week. Take with a full glass of water on an empty stomach. Takes on Mondays         . Calcium Carbonate-Vitamin D (CALCARB 600/D) 600-400 MG-UNIT per tablet   Oral   Take 1 tablet by mouth daily.         . clonazePAM (KLONOPIN) 1 MG tablet   Oral   Take 1 mg by mouth daily.         . divalproex (DEPAKOTE) 250 MG DR tablet   Oral   Take 250 mg by mouth 3 (three) times daily. 500mg  in the morning, 250mg  at 2pm, and 500mg  in the  evening         . docusate sodium (COLACE) 100 MG capsule   Oral   Take 100 mg by mouth 2 (two) times daily. For constipation         . DULoxetine (CYMBALTA) 60 MG capsule   Oral   Take 60 mg by mouth 2 (two) times daily.         Marland Kitchen gabapentin (NEURONTIN) 600 MG tablet   Oral   Take 600 mg by mouth 4 (four) times daily.         Marland Kitchen losartan-hydrochlorothiazide (HYZAAR) 50-12.5 MG per tablet   Oral   Take 1 tablet by mouth daily.         Marland Kitchen lurasidone (LATUDA) 40 MG TABS tablet   Oral   Take 40 mg by mouth daily with breakfast.         . Melatonin 3 MG TABS   Oral   Take 3 mg  by mouth at bedtime.         Marland Kitchen acetaminophen (TYLENOL) 500 MG tablet   Oral   Take 500 mg by mouth every 4 (four) hours as needed. For pain         . alum & mag hydroxide-simeth (MAALOX/MYLANTA) 200-200-20 MG/5ML suspension   Oral   Take 30 mLs by mouth every 6 (six) hours as needed. For indigestion         . Dextromethorphan-Quinidine (NUEDEXTA) 20-10 MG CAPS   Oral   Take 1 capsule by mouth every 12 (twelve) hours.         Marland Kitchen guaiFENesin-codeine (IOPHEN C-NR) 100-10 MG/5ML syrup   Oral   Take 10 mLs by mouth 4 (four) times daily as needed.         Marland Kitchen HYDROcodone-acetaminophen (NORCO/VICODIN) 5-325 MG per tablet   Oral   Take 1 tablet by mouth 4 (four) times daily as needed for moderate pain.         Marland Kitchen loperamide (IMODIUM) 2 MG capsule   Oral   Take 2 mg by mouth as needed for diarrhea or loose stools.         . magnesium hydroxide (MILK OF MAGNESIA) 400 MG/5ML suspension   Oral   Take 30 mLs by mouth daily as needed for mild constipation.         . Memantine HCl ER (NAMENDA XR) 28 MG CP24   Oral   Take 28 mg by mouth daily.         . naproxen (NAPROSYN) 500 MG tablet   Oral   Take 500 mg by mouth 2 (two) times daily as needed for mild pain.         Marland Kitchen omeprazole (PRILOSEC) 20 MG capsule   Oral   Take 20 mg by mouth daily after breakfast.         .  QUEtiapine (SEROQUEL) 25 MG tablet   Oral   Take 25 mg by mouth at bedtime.         . traMADol (ULTRAM) 50 MG tablet   Oral   Take 50 mg by mouth every 6 (six) hours as needed for moderate pain.          BP 125/50  Pulse 90  Temp(Src) 98.4 F (36.9 C) (Oral)  Resp 16  SpO2 95% Physical Exam  Nursing note and vitals reviewed. Constitutional: She appears well-developed and well-nourished. No distress.  HENT:  Head: Normocephalic.  Right Ear: External ear normal.  Left Ear: External ear normal.  Laceration inferior to mandible on the right, submandibular region, wound appears to extend posteriorly, no active bleeding, no large hematoma, some subq crepitus  Eyes: Conjunctivae are normal. Right eye exhibits no discharge. Left eye exhibits no discharge. No scleral icterus.  Neck: Neck supple. No tracheal deviation present.  Cardiovascular: Normal rate, regular rhythm and intact distal pulses.   Pulmonary/Chest: Effort normal and breath sounds normal. No stridor. No respiratory distress. She has no wheezes. She has no rales.  Abdominal: Soft. Bowel sounds are normal. She exhibits no distension. There is no tenderness. There is no rebound and no guarding.  Musculoskeletal: She exhibits no edema and no tenderness.  Neurological: She is alert. She has normal strength. No cranial nerve deficit (no facial droop, extraocular movements intact, no slurred speech) or sensory deficit. She exhibits normal muscle tone. She displays no seizure activity. Coordination normal.  Skin: Skin is warm and dry. No rash noted.  Psychiatric: She has  a normal mood and affect.    ED Course  Procedures (including critical care time) Labs Review Labs Reviewed - No data to display Imaging Review Ct Angio Neck W/cm &/or Wo/cm  10/31/2013   CLINICAL DATA:  Larey SeatFell, laceration to right face. Amnestic for event. Dementia.  EXAM: CT ANGIOGRAPHY NECK  TECHNIQUE: Multidetector CT imaging of the neck was performed using  the standard protocol during bolus administration of intravenous contrast. Multiplanar CT image reconstructions and MIPs were obtained to evaluate the vascular anatomy. Carotid stenosis measurements (when applicable) are obtained utilizing NASCET criteria, using the distal internal carotid diameter as the denominator.  CONTRAST:  100mL OMNIPAQUE IOHEXOL 350 MG/ML SOLN  COMPARISON:  CT maxillofacial performed concurrently.  FINDINGS: Please see CT maxillofacial report for discussion of soft tissue injury.  Mild transverse arch atheromatous change. Calcific plaque at the origin of the right and left subclavian vessels appears non flow reducing. No vertebral ostial lesion of significance.  Bilateral common carotid arteries widely patent. No appreciable atherosclerotic change bifurcations.  Bilateral internal carotid arteries appear unremarkable. No injury or dissection. No active extravasation from, or laceration to, the superficial facial or lingual branches of the right external carotid artery.  Spondylosis. Airway midline. Unremarkable cervical esophagus. Visualized intracranial compartment unremarkable. Negative orbits and sinuses. No mastoid fluid.  Review of the MIP images confirms the above findings.  IMPRESSION: No evidence for vascular injury, active extravasation, or dissection.   Electronically Signed   By: Davonna BellingJohn  Curnes M.D.   On: 10/31/2013 14:21   Ct Maxillofacial Wo Cm  10/31/2013   CLINICAL DATA:  Patient fell. Laceration right face and neck. Amnestic for event. History of dementia.  EXAM: CT MAXILLOFACIAL WITHOUT CONTRAST  TECHNIQUE: Multidetector CT imaging of the maxillofacial structures was performed. Multiplanar CT image reconstructions were also generated. A small metallic BB was placed on the right temple in order to reliably differentiate right from left.  COMPARISON:  None.  FINDINGS: There is a laceration to the right face just lateral and inferior to the mandibular ramus. A small amount of  air is seen in the subcutaneous fat. It is superficial to the buccal and submandibular spaces. There is hematoma and superficial stranding, but no apparent injury to the right submandibular gland or right parotid duct. There is no facial fracture. There is no radiopaque foreign body. Airway is midline. Normal masticator and parapharyngeal spaces. No sinus disease. No posttraumatic missing teeth.  IMPRESSION: Right facial laceration appears superficial. No injury to the salivary gland or duct. No hematoma or apparent violation of the buccinator, masticator, or parapharyngeal spaces.   Electronically Signed   By: Davonna BellingJohn  Curnes M.D.   On: 10/31/2013 14:17     EKG Interpretation None      MDM   Final diagnoses:  Facial laceration    CT scan did not show vascular injury.  NO fracture.  Wound repaired by PA Upstill.  At this time there does not appear to be any evidence of an acute emergency medical condition and the patient appears stable for discharge with appropriate outpatient follow up.     Celene KrasJon R Sohum Delillo, MD 10/31/13 [phone]

## 2013-10-31 NOTE — ED Notes (Signed)
[facility]

## 2013-10-31 NOTE — ED Notes (Signed)
Pt to ED via [facility] for a fall and facial laceration to R lower cheek. Pt reports tripping on trashcan; denies LOC. 3cm lac noted to cheek; bleeding controlled at this time

## 2013-10-31 NOTE — Progress Notes (Signed)
   CARE MANAGEMENT ED NOTE 10/31/2013  Patient:  [name] [name]   Account Number:  [id number]  Date Initiated:  10/31/2013  Documentation initiated by:  Radford PaxFERRERO,Antoninette Lerner  Subjective/Objective Assessment:   Patient presents to Ed post fall     Subjective/Objective Assessment Detail:     Action/Plan:   Action/Plan Detail:   Anticipated DC Date:  10/31/2013     Status Recommendation to Physician:   Result of Recommendation:    Other ED Services  Consult Working Plan    DC Planning Services  Other  PCP issues    Choice offered to / List presented to:            Status of service:  Completed, signed off  ED Comments:   ED Comments Detail:  Patient reports her pcp is [name] [name].  System updated.

## 2013-10-31 NOTE — ED Notes (Signed)
Suture cart at bedside 

## 2014-01-05 ENCOUNTER — Encounter (HOSPITAL_COMMUNITY): Payer: Self-pay | Admitting: Emergency Medicine

## 2014-01-05 ENCOUNTER — Emergency Department (HOSPITAL_COMMUNITY): Payer: Medicare Other

## 2014-01-05 ENCOUNTER — Emergency Department (HOSPITAL_COMMUNITY)
Admission: EM | Admit: 2014-01-05 | Discharge: 2014-01-05 | Disposition: A | Discharge status: Home / Self Care | Payer: Medicare Other | Attending: Emergency Medicine | Admitting: Emergency Medicine

## 2014-01-05 DIAGNOSIS — S0990XA Unspecified injury of head, initial encounter: Secondary | ICD-10-CM | POA: Insufficient documentation

## 2014-01-05 DIAGNOSIS — F329 Major depressive disorder, single episode, unspecified: Secondary | ICD-10-CM | POA: Insufficient documentation

## 2014-01-05 DIAGNOSIS — Z23 Encounter for immunization: Secondary | ICD-10-CM | POA: Insufficient documentation

## 2014-01-05 DIAGNOSIS — Y929 Unspecified place or not applicable: Secondary | ICD-10-CM | POA: Insufficient documentation

## 2014-01-05 DIAGNOSIS — R296 Repeated falls: Secondary | ICD-10-CM | POA: Insufficient documentation

## 2014-01-05 DIAGNOSIS — F411 Generalized anxiety disorder: Secondary | ICD-10-CM | POA: Insufficient documentation

## 2014-01-05 DIAGNOSIS — Y939 Activity, unspecified: Secondary | ICD-10-CM | POA: Insufficient documentation

## 2014-01-05 DIAGNOSIS — I10 Essential (primary) hypertension: Secondary | ICD-10-CM | POA: Insufficient documentation

## 2014-01-05 DIAGNOSIS — M1990 Unspecified osteoarthritis, unspecified site: Secondary | ICD-10-CM | POA: Insufficient documentation

## 2014-01-05 DIAGNOSIS — F0390 Unspecified dementia without behavioral disturbance: Secondary | ICD-10-CM | POA: Insufficient documentation

## 2014-01-05 DIAGNOSIS — Z79899 Other long term (current) drug therapy: Secondary | ICD-10-CM | POA: Insufficient documentation

## 2014-01-05 DIAGNOSIS — W19XXXA Unspecified fall, initial encounter: Secondary | ICD-10-CM

## 2014-01-05 MED ORDER — TETANUS-DIPHTH-ACELL PERTUSSIS 5-2.5-18.5 LF-MCG/0.5 IM SUSP
0.5000 mL | Freq: Once | INTRAMUSCULAR | Status: AC
  Administered 2014-01-05: 0.5 mL via INTRAMUSCULAR
  Filled 2014-01-05: qty 0.5

## 2014-01-05 NOTE — Discharge Instructions (Signed)

## 2014-01-05 NOTE — ED Notes (Signed)
Pt given a ham sandwich and orange juice

## 2014-01-05 NOTE — ED Notes (Signed)
Presented by EMS, report of witnessed fall, heat impact, no obvious hematoma on assessment at this time, reports head and neck pain, AOx2 which is her baseline normal with her hx of alzheimer. NAD at this time.

## 2014-01-05 NOTE — ED Provider Notes (Signed)
CSN: [id number]     Arrival date & time 01/05/14  0018 History   First MD Initiated Contact with Patient 01/05/14 0148     Chief Complaint  Patient presents with  . Fall  . Head Injury     (Consider location/radiation/quality/duration/timing/severity/associated sxs/prior Treatment) Patient is a 78 y.o. female presenting with fall and head injury. The history is provided by the EMS personnel. The history is limited by the condition of the patient.  Fall This is a new problem. The problem occurs constantly. The problem has not changed since onset.Nothing aggravates the symptoms. Nothing relieves the symptoms. She has tried nothing for the symptoms. The treatment provided no relief.  Head Injury   Past Medical History  Diagnosis Date  . Dementia   . Anxiety   . MDD (major depressive disorder)   . Hypertension   . Head ache   . Tremor   . Osteoarthritis    Past Surgical History  Procedure Laterality Date  . Abdominal hysterectomy    . Hemorroidectomy    . Pterygium excision    . Foot surgery      right   History reviewed. No pertinent family history. History  Substance Use Topics  . Smoking status: Unknown If Ever Smoked  . Smokeless tobacco: Not on file  . Alcohol Use: No   OB History   Grav Para Term Preterm Abortions TAB SAB Ect Mult Living                 Review of Systems  Unable to perform ROS     Allergies  Review of patient's allergies indicates no known allergies.  Home Medications   Prior to Admission medications   Medication Sig Start Date End Date Taking? Authorizing Provider  acetaminophen (TYLENOL) 500 MG tablet Take 500 mg by mouth every 4 (four) hours as needed. For pain   Yes Historical Provider, MD  Calcium Carbonate-Vitamin D (CALCARB 600/D) 600-400 MG-UNIT per tablet Take 1 tablet by mouth daily.   Yes Historical Provider, MD  clonazePAM (KLONOPIN) 1 MG tablet Take 1 mg by mouth daily.   Yes Historical Provider, MD   Dextromethorphan-Quinidine (NUEDEXTA) 20-10 MG CAPS Take 1 capsule by mouth every 12 (twelve) hours.   Yes Historical Provider, MD  divalproex (DEPAKOTE) 250 MG DR tablet Take 250 mg by mouth 3 (three) times daily.    Yes Historical Provider, MD  docusate sodium (COLACE) 100 MG capsule Take 100 mg by mouth 2 (two) times daily. For constipation   Yes Historical Provider, MD  DULoxetine (CYMBALTA) 60 MG capsule Take 60 mg by mouth 2 (two) times daily.   Yes Historical Provider, MD  gabapentin (NEURONTIN) 600 MG tablet Take 600 mg by mouth 4 (four) times daily.   Yes Historical Provider, MD  HYDROcodone-acetaminophen (NORCO/VICODIN) 5-325 MG per tablet Take 1 tablet by mouth 4 (four) times daily as needed for moderate pain.   Yes Historical Provider, MD  loperamide (IMODIUM) 2 MG capsule Take 2 mg by mouth as needed for diarrhea or loose stools.   Yes Historical Provider, MD  losartan-hydrochlorothiazide (HYZAAR) 50-12.5 MG per tablet Take 1 tablet by mouth daily.   Yes Historical Provider, MD  lurasidone (LATUDA) 40 MG TABS tablet Take 40 mg by mouth daily with lunch.    Yes Historical Provider, MD  Melatonin 3 MG TABS Take 3 mg by mouth at bedtime.   Yes Historical Provider, MD  omeprazole (PRILOSEC) 20 MG capsule Take 20 mg by mouth daily  after breakfast.   Yes Historical Provider, MD  QUEtiapine (SEROQUEL) 25 MG tablet Take 25 mg by mouth at bedtime.   Yes Historical Provider, MD  alendronate (FOSAMAX) 70 MG tablet Take 70 mg by mouth once a week. Take with a full glass of water on an empty stomach. Takes on Mondays    Historical Provider, MD  alum & mag hydroxide-simeth (MAALOX/MYLANTA) 200-200-20 MG/5ML suspension Take 30 mLs by mouth every 6 (six) hours as needed. For indigestion    Historical Provider, MD  guaiFENesin-codeine (IOPHEN C-NR) 100-10 MG/5ML syrup Take 10 mLs by mouth 4 (four) times daily as needed.    Historical Provider, MD  magnesium hydroxide (MILK OF MAGNESIA) 400 MG/5ML  suspension Take 30 mLs by mouth daily as needed for mild constipation.    Historical Provider, MD  naproxen (NAPROSYN) 500 MG tablet Take 500 mg by mouth 2 (two) times daily as needed for mild pain.    Historical Provider, MD  traMADol (ULTRAM) 50 MG tablet Take 50 mg by mouth every 6 (six) hours as needed for moderate pain.    Historical Provider, MD   BP 128/52  Pulse 76  Temp(Src) 97.5 F (36.4 C) (Oral)  Resp 14  SpO2 92% Physical Exam  Constitutional: She appears well-developed and well-nourished. No distress.  HENT:  Head: Normocephalic and atraumatic. Head is without raccoon's eyes and without Battle's sign.  Right Ear: No mastoid tenderness. No hemotympanum.  Left Ear: No mastoid tenderness. No hemotympanum.  Mouth/Throat: Oropharynx is clear and moist.  Eyes: Conjunctivae are normal. Pupils are equal, round, and reactive to light.  Neck: Normal range of motion. Neck supple.  No midline C t or l spine tenderness  Cardiovascular: Normal rate, regular rhythm and intact distal pulses.   Pulmonary/Chest: Effort normal and breath sounds normal. She has no wheezes. She has no rales.  Abdominal: Soft. Bowel sounds are normal. There is no tenderness. There is no rebound and no guarding.  Musculoskeletal: Normal range of motion. She exhibits no edema and no tenderness.  No leg or snuff box tenderness  Neurological: She is alert. She has normal reflexes.  Skin: Skin is warm and dry.  Psychiatric: She has a normal mood and affect.    ED Course  Procedures (including critical care time) Labs Review Labs Reviewed - No data to display  Imaging Review Ct Head Wo Contrast  01/05/2014   CLINICAL DATA:  Fell, head pain, neck pain, dementia.  EXAM: CT HEAD WITHOUT CONTRAST  CT CERVICAL SPINE WITHOUT CONTRAST  TECHNIQUE: Multidetector CT imaging of the head and cervical spine was performed following the standard protocol without intravenous contrast. Multiplanar CT image reconstructions of  the cervical spine were also generated.  COMPARISON:  Multiple priors, most recent 10/31/2013.  FINDINGS: CT HEAD FINDINGS  No evidence for acute infarction, hemorrhage, mass lesion, hydrocephalus, or extra-axial fluid. Generalized atrophy. Chronic microvascular ischemic change. Moderate size left paramedian parietal scalp hematoma. No skull fracture.  CT CERVICAL SPINE FINDINGS  There is no visible cervical spine fracture, traumatic subluxation, prevertebral soft tissue swelling, or intraspinal hematoma. Mild reversal of the normal cervical lordosis could be positional or due to spasm. Moderate spondylosis C4-C7. No lung apex lesion. Atherosclerosis. No neck masses.  IMPRESSION: Posterior scalp hematoma without skull fracture or intracranial hemorrhage.  Stable changes of generalized atrophy and small vessel disease.  Cervical spondylosis without fracture or traumatic subluxation.   Electronically Signed   By: Davonna Belling M.D.   On: 01/05/2014 02:04  Ct Cervical Spine Wo Contrast  01/05/2014   CLINICAL DATA:  Fell, head pain, neck pain, dementia.  EXAM: CT HEAD WITHOUT CONTRAST  CT CERVICAL SPINE WITHOUT CONTRAST  TECHNIQUE: Multidetector CT imaging of the head and cervical spine was performed following the standard protocol without intravenous contrast. Multiplanar CT image reconstructions of the cervical spine were also generated.  COMPARISON:  Multiple priors, most recent 10/31/2013.  FINDINGS: CT HEAD FINDINGS  No evidence for acute infarction, hemorrhage, mass lesion, hydrocephalus, or extra-axial fluid. Generalized atrophy. Chronic microvascular ischemic change. Moderate size left paramedian parietal scalp hematoma. No skull fracture.  CT CERVICAL SPINE FINDINGS  There is no visible cervical spine fracture, traumatic subluxation, prevertebral soft tissue swelling, or intraspinal hematoma. Mild reversal of the normal cervical lordosis could be positional or due to spasm. Moderate spondylosis C4-C7. No  lung apex lesion. Atherosclerosis. No neck masses.  IMPRESSION: Posterior scalp hematoma without skull fracture or intracranial hemorrhage.  Stable changes of generalized atrophy and small vessel disease.  Cervical spondylosis without fracture or traumatic subluxation.   Electronically Signed   By: Davonna Belling M.D.   On: 01/05/2014 02:04     EKG Interpretation None      MDM   Final diagnoses:  None    No head or neck trauma    Vennela Jutte K Mary-Ann Pennella-Rasch, MD 01/05/14 [phone]

## 2014-01-05 NOTE — ED Notes (Signed)
Bed: WA06 Expected date:  Expected time:  Means of arrival:  Comments: EMS elderly fall 

## 2015-01-22 ENCOUNTER — Emergency Department (HOSPITAL_COMMUNITY): Payer: Medicare Other

## 2015-01-22 ENCOUNTER — Encounter (HOSPITAL_COMMUNITY): Payer: Self-pay | Admitting: Emergency Medicine

## 2015-01-22 ENCOUNTER — Emergency Department (HOSPITAL_COMMUNITY)
Admission: EM | Admit: 2015-01-22 | Discharge: 2015-01-22 | Disposition: A | Discharge status: Home / Self Care | Payer: Medicare Other | Source: Home / Self Care | Attending: Emergency Medicine | Admitting: Emergency Medicine

## 2015-01-22 DIAGNOSIS — F0391 Unspecified dementia with behavioral disturbance: Secondary | ICD-10-CM | POA: Diagnosis present

## 2015-01-22 DIAGNOSIS — S79912A Unspecified injury of left hip, initial encounter: Secondary | ICD-10-CM

## 2015-01-22 DIAGNOSIS — E876 Hypokalemia: Secondary | ICD-10-CM | POA: Diagnosis present

## 2015-01-22 DIAGNOSIS — I10 Essential (primary) hypertension: Secondary | ICD-10-CM

## 2015-01-22 DIAGNOSIS — Z79899 Other long term (current) drug therapy: Secondary | ICD-10-CM

## 2015-01-22 DIAGNOSIS — E860 Dehydration: Secondary | ICD-10-CM | POA: Diagnosis present

## 2015-01-22 DIAGNOSIS — M1990 Unspecified osteoarthritis, unspecified site: Secondary | ICD-10-CM

## 2015-01-22 DIAGNOSIS — Y998 Other external cause status: Secondary | ICD-10-CM

## 2015-01-22 DIAGNOSIS — F329 Major depressive disorder, single episode, unspecified: Secondary | ICD-10-CM | POA: Diagnosis present

## 2015-01-22 DIAGNOSIS — F319 Bipolar disorder, unspecified: Secondary | ICD-10-CM | POA: Insufficient documentation

## 2015-01-22 DIAGNOSIS — G9341 Metabolic encephalopathy: Secondary | ICD-10-CM | POA: Diagnosis present

## 2015-01-22 DIAGNOSIS — B954 Other streptococcus as the cause of diseases classified elsewhere: Secondary | ICD-10-CM | POA: Diagnosis present

## 2015-01-22 DIAGNOSIS — F419 Anxiety disorder, unspecified: Secondary | ICD-10-CM | POA: Diagnosis present

## 2015-01-22 DIAGNOSIS — Y9389 Activity, other specified: Secondary | ICD-10-CM | POA: Insufficient documentation

## 2015-01-22 DIAGNOSIS — L8990 Pressure ulcer of unspecified site, unspecified stage: Secondary | ICD-10-CM | POA: Diagnosis present

## 2015-01-22 DIAGNOSIS — Z791 Long term (current) use of non-steroidal anti-inflammatories (NSAID): Secondary | ICD-10-CM

## 2015-01-22 DIAGNOSIS — N390 Urinary tract infection, site not specified: Secondary | ICD-10-CM | POA: Diagnosis not present

## 2015-01-22 DIAGNOSIS — Y92128 Other place in nursing home as the place of occurrence of the external cause: Secondary | ICD-10-CM

## 2015-01-22 DIAGNOSIS — F0390 Unspecified dementia without behavioral disturbance: Secondary | ICD-10-CM

## 2015-01-22 DIAGNOSIS — E871 Hypo-osmolality and hyponatremia: Secondary | ICD-10-CM | POA: Diagnosis present

## 2015-01-22 DIAGNOSIS — W07XXXA Fall from chair, initial encounter: Secondary | ICD-10-CM | POA: Insufficient documentation

## 2015-01-22 DIAGNOSIS — W19XXXA Unspecified fall, initial encounter: Secondary | ICD-10-CM

## 2015-01-22 DIAGNOSIS — Z792 Long term (current) use of antibiotics: Secondary | ICD-10-CM

## 2015-01-22 DIAGNOSIS — R251 Tremor, unspecified: Secondary | ICD-10-CM | POA: Diagnosis present

## 2015-01-22 DIAGNOSIS — M25552 Pain in left hip: Secondary | ICD-10-CM

## 2015-01-22 DIAGNOSIS — R410 Disorientation, unspecified: Secondary | ICD-10-CM | POA: Diagnosis not present

## 2015-01-22 NOTE — ED Notes (Signed)
Bed: WA03 Expected date:  Expected time:  Means of arrival:  Comments: Ems- 79 yo fall

## 2015-01-22 NOTE — ED Notes (Signed)
Ambulated to bathroom with assistance  

## 2015-01-22 NOTE — Discharge Instructions (Signed)
May take tylenol or motrin as needed for pain. Follow-up with your primary care physician. Return to the ED for new or worsening symptoms.

## 2015-01-22 NOTE — ED Provider Notes (Signed)
CSN: [id number]     Arrival date & time 01/22/15  1255 History   First MD Initiated Contact with Patient 01/22/15 1345     Chief Complaint  Patient presents with  . Fall     (Consider location/radiation/quality/duration/timing/severity/associated sxs/prior Treatment) Patient is a 79 y.o. female presenting with fall. The history is provided by the patient and medical records.  Fall   LEVEL 5 CAVEAT:  DEMENTIA This is a 79 year old female with past medical history significant for dementia, anxiety, hypertension, osteoarthritis, presenting to the ED following a fall. Patient states she was walking to the Stable, she pulled out a chair and attempted to sit in it, however she sat on the edges the chair and flipped the chair over. She fell to the ground, denies head injury or loss of consciousness. She denies any current headache neck pain or back pain. She does have some mild left hip pain. Patient ambulates with walker at baseline, was not using this at time of fall. She denies numbness or weakness of her lower extremities.  Patient is not currently on anti-coagulation.  Past Medical History  Diagnosis Date  . Dementia   . Anxiety   . MDD (major depressive disorder)   . Hypertension   . Head ache   . Tremor   . Osteoarthritis    Past Surgical History  Procedure Laterality Date  . Abdominal hysterectomy    . Hemorroidectomy    . Pterygium excision    . Foot surgery      right   No family history on file. History  Substance Use Topics  . Smoking status: Unknown If Ever Smoked  . Smokeless tobacco: Not on file  . Alcohol Use: No   OB History    No data available     Review of Systems  Unable to perform ROS: Dementia      Allergies  Review of patient's allergies indicates no known allergies.  Home Medications   Prior to Admission medications   Medication Sig Start Date End Date Taking? Authorizing Provider  acetaminophen (TYLENOL) 500 MG tablet Take 500 mg by mouth  every 4 (four) hours as needed. For pain   Yes Historical Provider, MD  alendronate (FOSAMAX) 70 MG tablet Take 70 mg by mouth once a week. Take with a full glass of water on an empty stomach. Takes on Mondays   Yes Historical Provider, MD  alum & mag hydroxide-simeth (MAALOX/MYLANTA) 200-200-20 MG/5ML suspension Take 30 mLs by mouth every 6 (six) hours as needed. For indigestion   Yes Historical Provider, MD  Calcium Carbonate-Vitamin D (CALCARB 600/D) 600-400 MG-UNIT per tablet Take 1 tablet by mouth daily.   Yes Historical Provider, MD  clonazePAM (KLONOPIN) 1 MG tablet Take 1 mg by mouth 2 (two) times daily.    Yes Historical Provider, MD  cycloSPORINE (RESTASIS) 0.05 % ophthalmic emulsion Place 1 drop into both eyes 2 (two) times daily.   Yes Historical Provider, MD  divalproex (DEPAKOTE) 250 MG DR tablet Take 250 mg by mouth 3 (three) times daily.    Yes Historical Provider, MD  docusate sodium (COLACE) 100 MG capsule Take 100 mg by mouth 2 (two) times daily. For constipation   Yes Historical Provider, MD  DULoxetine (CYMBALTA) 60 MG capsule Take 60 mg by mouth 2 (two) times daily.   Yes Historical Provider, MD  gabapentin (NEURONTIN) 600 MG tablet Take 600 mg by mouth 4 (four) times daily.   Yes Historical Provider, MD  HYDROcodone-acetaminophen [facility](NORCO) 7.5-325  MG per tablet Take 1 tablet by mouth 4 (four) times daily as needed for moderate pain.   Yes Historical Provider, MD  loperamide (IMODIUM) 2 MG capsule Take 2 mg by mouth as needed for diarrhea or loose stools.   Yes Historical Provider, MD  losartan-hydrochlorothiazide (HYZAAR) 50-12.5 MG per tablet Take 1 tablet by mouth daily.   Yes Historical Provider, MD  lurasidone (LATUDA) 40 MG TABS tablet Take 40 mg by mouth daily with lunch.    Yes Historical Provider, MD  magnesium hydroxide (MILK OF MAGNESIA) 400 MG/5ML suspension Take 30 mLs by mouth daily as needed for mild constipation.   Yes Historical Provider, MD  Melatonin 3 MG TABS  Take 3 mg by mouth at bedtime.   Yes Historical Provider, MD  meloxicam (MOBIC) 15 MG tablet Take 15 mg by mouth daily.   Yes Historical Provider, MD  omeprazole (PRILOSEC) 20 MG capsule Take 20 mg by mouth daily after breakfast.   Yes Historical Provider, MD  QUEtiapine (SEROQUEL) 25 MG tablet Take 25 mg by mouth at bedtime.   Yes Historical Provider, MD  sertraline (ZOLOFT) 50 MG tablet Take 50 mg by mouth daily.   Yes Historical Provider, MD  valACYclovir (VALTREX) 500 MG tablet Take 500 mg by mouth daily.   Yes Historical Provider, MD   BP 118/62 mmHg  Pulse 84  Temp(Src) 97.6 F (36.4 C) (Oral)  Resp 19  SpO2 97%   Physical Exam  Constitutional: She appears well-developed and well-nourished. No distress.  HENT:  Head: Normocephalic and atraumatic.  Mouth/Throat: Oropharynx is clear and moist.  No visible signs of head injury  Eyes: Conjunctivae and EOM are normal. Pupils are equal, round, and reactive to light.  Neck: Normal range of motion. Neck supple.  Cardiovascular: Normal rate, regular rhythm and normal heart sounds.   Pulmonary/Chest: Effort normal and breath sounds normal. No respiratory distress. She has no wheezes.  Abdominal: Soft. Bowel sounds are normal. There is no tenderness. There is no guarding.  Musculoskeletal: Normal range of motion.       Left hip: She exhibits tenderness and bony tenderness.       Cervical back: Normal.  Left hip with mild tenderness to palpation along lateral aspect, no gross deformity, no leg shortening; strong DP pulse, normal sensation Cervical spine non-tender, full ROM  Neurological: She is alert.  Awake, alert, following commands when prompted; moving all extremities well  Skin: Skin is warm and dry. She is not diaphoretic.  Psychiatric: She has a normal mood and affect.  Nursing note and vitals reviewed.   ED Course  Procedures (including critical care time) Labs Review Labs Reviewed - No data to display  Imaging Review Dg  Hip Unilat With Pelvis 2-3 Views Left  01/22/2015   CLINICAL DATA:  Recent fall with left hip pain  EXAM: LEFT HIP (WITH PELVIS) 2-3 VIEWS  COMPARISON:  None.  FINDINGS: There is no evidence of hip fracture or dislocation. There is no evidence of arthropathy or other focal bone abnormality.  IMPRESSION: No acute abnormality noted.   Electronically Signed   By: Alcide Clever M.D.   On: 01/22/2015 14:23     EKG Interpretation None      MDM   Final diagnoses:  Fall  Hip pain, left   79 year old female here after a mechanical fall at her nursing facility. No head injury or loss of consciousness. Patient without any signs of significant trauma on exam. She denies any headache or  neck pain. She does report some mild left hip pain along lateral aspect. There is no gross bony deformity or leg shortening noted on exam. X-ray was obtained which is negative for acute findings.  Patient has been able to ambulate to x-ray and the restroom without difficulty.  Patient appears stable for discharge back to facility.  Case discussed with attending physician, Dr. Juleen China, agrees with assessment and plan of care.  Garlon Hatchet, PA-C 01/22/15 1507  Garlon Hatchet, PA-C 01/22/15 1508  Raeford Razor, MD 01/24/15 [phone]

## 2015-01-22 NOTE — ED Notes (Signed)
Per EMS- patient here from [facility] with complaints of fall while going to lunch. Denies pain. No LOC. Hx Dementia.

## 2015-01-22 NOTE — ED Notes (Signed)
[facility] called  

## 2015-01-24 ENCOUNTER — Inpatient Hospital Stay (HOSPITAL_COMMUNITY)
Admission: EM | Admit: 2015-01-24 | Discharge: 2015-01-27 | DRG: 689 | Disposition: A | Discharge status: Nursing Home (Includes ICF) | Payer: Medicare Other | Attending: Internal Medicine | Admitting: Internal Medicine

## 2015-01-24 ENCOUNTER — Encounter (HOSPITAL_COMMUNITY): Payer: Self-pay

## 2015-01-24 ENCOUNTER — Emergency Department (HOSPITAL_COMMUNITY): Payer: Medicare Other

## 2015-01-24 DIAGNOSIS — E871 Hypo-osmolality and hyponatremia: Secondary | ICD-10-CM | POA: Diagnosis present

## 2015-01-24 DIAGNOSIS — F0391 Unspecified dementia with behavioral disturbance: Secondary | ICD-10-CM | POA: Diagnosis present

## 2015-01-24 DIAGNOSIS — B954 Other streptococcus as the cause of diseases classified elsewhere: Secondary | ICD-10-CM | POA: Diagnosis present

## 2015-01-24 DIAGNOSIS — E860 Dehydration: Secondary | ICD-10-CM | POA: Diagnosis present

## 2015-01-24 DIAGNOSIS — N390 Urinary tract infection, site not specified: Secondary | ICD-10-CM | POA: Diagnosis present

## 2015-01-24 DIAGNOSIS — Z79899 Other long term (current) drug therapy: Secondary | ICD-10-CM | POA: Diagnosis not present

## 2015-01-24 DIAGNOSIS — R0902 Hypoxemia: Secondary | ICD-10-CM

## 2015-01-24 DIAGNOSIS — F329 Major depressive disorder, single episode, unspecified: Secondary | ICD-10-CM | POA: Diagnosis present

## 2015-01-24 DIAGNOSIS — F03918 Unspecified dementia, unspecified severity, with other behavioral disturbance: Secondary | ICD-10-CM | POA: Diagnosis present

## 2015-01-24 DIAGNOSIS — R410 Disorientation, unspecified: Secondary | ICD-10-CM

## 2015-01-24 DIAGNOSIS — I10 Essential (primary) hypertension: Secondary | ICD-10-CM | POA: Diagnosis present

## 2015-01-24 DIAGNOSIS — E876 Hypokalemia: Secondary | ICD-10-CM | POA: Diagnosis present

## 2015-01-24 DIAGNOSIS — G9341 Metabolic encephalopathy: Secondary | ICD-10-CM | POA: Diagnosis present

## 2015-01-24 DIAGNOSIS — L8990 Pressure ulcer of unspecified site, unspecified stage: Secondary | ICD-10-CM | POA: Diagnosis present

## 2015-01-24 DIAGNOSIS — R251 Tremor, unspecified: Secondary | ICD-10-CM | POA: Diagnosis present

## 2015-01-24 DIAGNOSIS — F419 Anxiety disorder, unspecified: Secondary | ICD-10-CM | POA: Diagnosis present

## 2015-01-24 LAB — CBC
HEMATOCRIT: 38.2 % (ref 36.0–46.0)
HEMOGLOBIN: 13.1 g/dL (ref 12.0–15.0)
MCH: 31.9 pg (ref 26.0–34.0)
MCHC: 34.3 g/dL (ref 30.0–36.0)
MCV: 92.9 fL (ref 78.0–100.0)
Platelets: 215 10*3/uL (ref 150–400)
RBC: 4.11 MIL/uL (ref 3.87–5.11)
RDW: 12.6 % (ref 11.5–15.5)
WBC: 8.3 10*3/uL (ref 4.0–10.5)

## 2015-01-24 LAB — I-STAT CG4 LACTIC ACID, ED: Lactic Acid, Venous: 0.74 mmol/L (ref 0.5–2.0)

## 2015-01-24 LAB — BASIC METABOLIC PANEL
ANION GAP: 10 (ref 5–15)
BUN: 26 mg/dL — AB (ref 6–20)
CALCIUM: 11 mg/dL — AB (ref 8.9–10.3)
CHLORIDE: 98 mmol/L — AB (ref 101–111)
CO2: 26 mmol/L (ref 22–32)
Creatinine, Ser: 1.3 mg/dL — ABNORMAL HIGH (ref 0.44–1.00)
GFR calc Af Amer: 45 mL/min — ABNORMAL LOW (ref >60)
GFR calc non Af Amer: 39 mL/min — ABNORMAL LOW (ref >60)
Glucose, Bld: 108 mg/dL — ABNORMAL HIGH (ref 65–99)
POTASSIUM: 3.5 mmol/L (ref 3.5–5.1)
SODIUM: 134 mmol/L — AB (ref 135–145)

## 2015-01-24 LAB — CBC WITH DIFFERENTIAL/PLATELET
BASOS ABS: 0 10*3/uL (ref 0.0–0.1)
BASOS PCT: 0 % (ref 0–1)
Eosinophils Absolute: 0.1 10*3/uL (ref 0.0–0.7)
Eosinophils Relative: 1 % (ref 0–5)
HEMATOCRIT: 35.9 % — AB (ref 36.0–46.0)
Hemoglobin: 12 g/dL (ref 12.0–15.0)
Lymphocytes Relative: 14 % (ref 12–46)
Lymphs Abs: 1.3 10*3/uL (ref 0.7–4.0)
MCH: 31.3 pg (ref 26.0–34.0)
MCHC: 33.4 g/dL (ref 30.0–36.0)
MCV: 93.7 fL (ref 78.0–100.0)
MONO ABS: 0.9 10*3/uL (ref 0.1–1.0)
Monocytes Relative: 10 % (ref 3–12)
NEUTROS ABS: 7.2 10*3/uL (ref 1.7–7.7)
Neutrophils Relative %: 75 % (ref 43–77)
Platelets: 215 10*3/uL (ref 150–400)
RBC: 3.83 MIL/uL — ABNORMAL LOW (ref 3.87–5.11)
RDW: 12.5 % (ref 11.5–15.5)
WBC: 9.5 10*3/uL (ref 4.0–10.5)

## 2015-01-24 LAB — CREATININE, SERUM
Creatinine, Ser: 0.96 mg/dL (ref 0.44–1.00)
GFR, EST NON AFRICAN AMERICAN: 56 mL/min — AB (ref >60)

## 2015-01-24 LAB — URINALYSIS, ROUTINE W REFLEX MICROSCOPIC
GLUCOSE, UA: NEGATIVE mg/dL
HGB URINE DIPSTICK: NEGATIVE
Ketones, ur: NEGATIVE mg/dL
NITRITE: NEGATIVE
PH: 5.5 (ref 5.0–8.0)
PROTEIN: NEGATIVE mg/dL
Specific Gravity, Urine: 1.018 (ref 1.005–1.030)
UROBILINOGEN UA: 0.2 mg/dL (ref 0.0–1.0)

## 2015-01-24 LAB — URINE MICROSCOPIC-ADD ON

## 2015-01-24 LAB — I-STAT TROPONIN, ED: Troponin i, poc: 0.01 ng/mL (ref 0.00–0.08)

## 2015-01-24 LAB — PHOSPHORUS: PHOSPHORUS: 3.2 mg/dL (ref 2.5–4.6)

## 2015-01-24 LAB — MAGNESIUM: Magnesium: 2.1 mg/dL (ref 1.7–2.4)

## 2015-01-24 MED ORDER — ACETAMINOPHEN 650 MG RE SUPP: 650.0000 mg | Freq: Four times a day (QID) | RECTAL | Status: DC | PRN

## 2015-01-24 MED ORDER — ACETAMINOPHEN 325 MG PO TABS
650.0000 mg | ORAL_TABLET | Freq: Four times a day (QID) | ORAL | Status: DC | PRN
  Administered 2015-01-25: 650 mg via ORAL
  Filled 2015-01-24: qty 2

## 2015-01-24 MED ORDER — DEXTROSE-NACL 5-0.9 % IV SOLN
INTRAVENOUS | Status: DC
  Administered 2015-01-24 – 2015-01-27 (×6): via INTRAVENOUS

## 2015-01-24 MED ORDER — HEPARIN SODIUM (PORCINE) 5000 UNIT/ML IJ SOLN
5000.0000 [IU] | Freq: Three times a day (TID) | INTRAMUSCULAR | Status: DC
  Administered 2015-01-24 – 2015-01-26 (×6): 5000 [IU] via SUBCUTANEOUS
  Filled 2015-01-24 (×6): qty 1

## 2015-01-24 MED ORDER — SERTRALINE HCL 50 MG PO TABS
50.0000 mg | ORAL_TABLET | Freq: Every day | ORAL | Status: DC
  Administered 2015-01-25 – 2015-01-27 (×3): 50 mg via ORAL
  Filled 2015-01-24 (×3): qty 1

## 2015-01-24 MED ORDER — DEXTROSE 5 % IV SOLN
1.0000 g | Freq: Once | INTRAVENOUS | Status: AC
  Administered 2015-01-24: 1 g via INTRAVENOUS
  Filled 2015-01-24: qty 10

## 2015-01-24 MED ORDER — VALACYCLOVIR HCL 500 MG PO TABS
500.0000 mg | ORAL_TABLET | Freq: Every day | ORAL | Status: DC
  Administered 2015-01-25 – 2015-01-27 (×3): 500 mg via ORAL
  Filled 2015-01-24 (×3): qty 1

## 2015-01-24 MED ORDER — DEXTROSE 5 % IV SOLN
1.0000 g | INTRAVENOUS | Status: DC
  Administered 2015-01-25 – 2015-01-27 (×3): 1 g via INTRAVENOUS
  Filled 2015-01-24 (×3): qty 10

## 2015-01-24 MED ORDER — PANTOPRAZOLE SODIUM 40 MG PO TBEC
40.0000 mg | DELAYED_RELEASE_TABLET | Freq: Every day | ORAL | Status: DC
  Administered 2015-01-25 – 2015-01-27 (×3): 40 mg via ORAL
  Filled 2015-01-24 (×3): qty 1

## 2015-01-24 MED ORDER — CYCLOSPORINE 0.05 % OP EMUL
1.0000 [drp] | Freq: Two times a day (BID) | OPHTHALMIC | Status: DC
  Administered 2015-01-24 – 2015-01-27 (×5): 1 [drp] via OPHTHALMIC
  Filled 2015-01-24 (×7): qty 1

## 2015-01-24 MED ORDER — ONDANSETRON HCL 4 MG PO TABS
4.0000 mg | ORAL_TABLET | Freq: Four times a day (QID) | ORAL | Status: DC | PRN
Start: 2015-01-24 — End: 2015-01-27

## 2015-01-24 MED ORDER — ONDANSETRON HCL 4 MG/2ML IJ SOLN
4.0000 mg | Freq: Four times a day (QID) | INTRAMUSCULAR | Status: DC | PRN
Start: 2015-01-24 — End: 2015-01-27

## 2015-01-24 MED ORDER — LURASIDONE HCL 40 MG PO TABS
40.0000 mg | ORAL_TABLET | Freq: Every day | ORAL | Status: DC
  Administered 2015-01-25 – 2015-01-27 (×3): 40 mg via ORAL
  Filled 2015-01-24 (×4): qty 1

## 2015-01-24 MED ORDER — DIVALPROEX SODIUM 250 MG PO DR TAB
250.0000 mg | DELAYED_RELEASE_TABLET | Freq: Three times a day (TID) | ORAL | Status: DC
  Administered 2015-01-24 – 2015-01-27 (×9): 250 mg via ORAL
  Filled 2015-01-24 (×9): qty 1

## 2015-01-24 MED ORDER — DULOXETINE HCL 60 MG PO CPEP
60.0000 mg | ORAL_CAPSULE | Freq: Two times a day (BID) | ORAL | Status: DC
  Administered 2015-01-24 – 2015-01-27 (×6): 60 mg via ORAL
  Filled 2015-01-24 (×6): qty 1

## 2015-01-24 MED ORDER — QUETIAPINE FUMARATE 25 MG PO TABS
25.0000 mg | ORAL_TABLET | Freq: Every day | ORAL | Status: DC
  Administered 2015-01-24 – 2015-01-26 (×3): 25 mg via ORAL
  Filled 2015-01-24 (×3): qty 1

## 2015-01-24 MED ORDER — PANTOPRAZOLE SODIUM 40 MG PO TBEC: 40.0000 mg | DELAYED_RELEASE_TABLET | Freq: Every day | ORAL | Status: DC

## 2015-01-24 NOTE — ED Notes (Signed)
Bed: WA06 Expected date:  Expected time:  Means of arrival:  Comments: EMS- 79yo F, back pain, lethargy

## 2015-01-24 NOTE — Progress Notes (Signed)
ANTIBIOTIC CONSULT NOTE - INITIAL  Pharmacy Consult for ceftriaxone Indication: UTI  No Known Allergies   Vital Signs: Temp: 97.9 F (36.6 C) (06/29 1559) Temp Source: Rectal (06/29 1559) BP: 131/65 mmHg (06/29 1705) Pulse Rate: 81 (06/29 1705) Intake/Output from previous day:   Intake/Output from this shift:    Labs:  Recent Labs  01/24/15 1445  WBC 9.5  HGB 12.0  PLT 215  CREATININE 1.30*   CrCl cannot be calculated (Unknown ideal weight.). No results for input(s): VANCOTROUGH, VANCOPEAK, VANCORANDOM, GENTTROUGH, GENTPEAK, GENTRANDOM, TOBRATROUGH, TOBRAPEAK, TOBRARND, AMIKACINPEAK, AMIKACINTROU, AMIKACIN in the last 72 hours.   Microbiology: No results found for this or any previous visit (from the past 720 hour(s)).  Medical History: Past Medical History  Diagnosis Date  . Dementia   . Anxiety   . MDD (major depressive disorder)   . Hypertension   . Head ache   . Tremor   . Osteoarthritis     Medications:  See med rec   Assessment: Patient's a 79 y.o F [facility] with c/o weakness and AMS. UA with moderate leukocytes. To start ceftriaxone for UTI.  Plan:  - ceftriaxone 1gm IV q24h - pharmacy will sign off since no renal function adjustment is needed with ceftriaxone - re-consult pharmacy if need further assistance  Thank you for allowing pharmacy to participate in this patient's care.  Senya Hinzman P 01/24/2015,5:32 PM

## 2015-01-24 NOTE — H&P (Addendum)
[facility] History and Physical  [name] GaskinsCharlotte [name] JXB:[id number] DOB: [birth year] DOA: 01/24/2015  Referring physician: Dr. Gwendolyn GrantWalden PCP: Ron ParkerBOWEN,SAMUEL, MD   Chief Complaint: AMS  HPI: [name] [name] is a 79 y.o. female  Who presented from her nursing home after patient was found to be weak and confused. This has been going on for 1 day. The problem has been persistent and as such patient presented to the ED for further evaluation. History is obtained from ER physician as patient is not able to provide any meaningful history secondary to altered mental status. Since onset the problem has been persistent and gradually getting worse.  While in the ED patient was found to have UTI, hyponatremia, and altered mental status. Subsequently we were consulted for further medical evaluation recommendations.   Review of Systems:  Unable to accurately assess secondary to metabolic encephalopathy  Past Medical History  Diagnosis Date  . Dementia   . Anxiety   . MDD (major depressive disorder)   . Hypertension   . Head ache   . Tremor   . Osteoarthritis    Past Surgical History  Procedure Laterality Date  . Abdominal hysterectomy    . Hemorroidectomy    . Pterygium excision    . Foot surgery      right   Social History:  reports that she does not drink alcohol or use illicit drugs. Her tobacco history is not on file.  No Known Allergies  Family history -Unable to obtain secondary to altered mental status  Prior to Admission medications   Medication Sig Start Date End Date Taking? Authorizing Provider  acetaminophen (TYLENOL) 500 MG tablet Take 500 mg by mouth every 4 (four) hours as needed for fever (fever). For pain   Yes Historical Provider, MD  Calcium Carbonate-Vitamin D (CALCARB 600/D) 600-400 MG-UNIT per tablet Take 1 tablet by mouth daily.   Yes Historical Provider, MD  clonazePAM (KLONOPIN) 0.5 MG tablet Take 0.5 mg by mouth 2 (two) times daily.   Yes Historical Provider,  MD  cycloSPORINE (RESTASIS) 0.05 % ophthalmic emulsion Place 1 drop into both eyes 2 (two) times daily.   Yes Historical Provider, MD  divalproex (DEPAKOTE) 250 MG DR tablet Take 250 mg by mouth 3 (three) times daily.    Yes Historical Provider, MD  docusate sodium (COLACE) 100 MG capsule Take 100 mg by mouth 2 (two) times daily. For constipation   Yes Historical Provider, MD  DULoxetine (CYMBALTA) 60 MG capsule Take 60 mg by mouth 2 (two) times daily.   Yes Historical Provider, MD  gabapentin (NEURONTIN) 600 MG tablet Take 600 mg by mouth 4 (four) times daily.   Yes Historical Provider, MD  guaiFENesin (ROBITUSSIN) 100 MG/5ML liquid Take 200 mg by mouth every 6 (six) hours as needed for cough.   Yes Historical Provider, MD  losartan-hydrochlorothiazide (HYZAAR) 50-12.5 MG per tablet Take 1 tablet by mouth daily.   Yes Historical Provider, MD  lurasidone (LATUDA) 40 MG TABS tablet Take 40 mg by mouth daily with lunch.    Yes Historical Provider, MD  Melatonin 3 MG TABS Take 3 mg by mouth at bedtime.   Yes Historical Provider, MD  meloxicam (MOBIC) 15 MG tablet Take 15 mg by mouth daily.   Yes Historical Provider, MD  omeprazole (PRILOSEC) 20 MG capsule Take 20 mg by mouth daily after breakfast.   Yes Historical Provider, MD  QUEtiapine (SEROQUEL) 25 MG tablet Take 25 mg by mouth at bedtime.   Yes Historical Provider, MD  sertraline (ZOLOFT) 50 MG tablet Take 50 mg by mouth daily.   Yes Historical Provider, MD  valACYclovir (VALTREX) 500 MG tablet Take 500 mg by mouth daily.   Yes Historical Provider, MD  alendronate (FOSAMAX) 70 MG tablet Take 70 mg by mouth once a week. Take with a full glass of water on an empty stomach. Takes on Mondays    Historical Provider, MD  HYDROcodone-acetaminophen (NORCO) 7.5-325 MG per tablet Take 1 tablet by mouth 4 (four) times daily as needed for moderate pain.    Historical Provider, MD  loperamide (IMODIUM) 2 MG capsule Take 2 mg by mouth as needed for diarrhea or  loose stools.    Historical Provider, MD  magnesium hydroxide (MILK OF MAGNESIA) 400 MG/5ML suspension Take 30 mLs by mouth daily as needed for mild constipation.    Historical Provider, MD   Physical Exam: Filed Vitals:   01/24/15 1422 01/24/15 1500 01/24/15 1559 01/24/15 1705  BP: 101/48 112/45  131/65  Pulse: 77 73  81  Temp:   97.9 F (36.6 C)   TempSrc:   Rectal   Resp: SpO2: 91% 91%  94%    Wt Readings from Last 3 Encounters:  No data found for Wt    General: Patient is calm, alert and awake, in no acute distress, oriented 1 to person Eyes: PERRL, normal lids, irises & conjunctiva ENT: grossly normal hearing, lips & tongue dry mucous membranes Neck: no LAD, masses or thyromegaly Cardiovascular: RRR, no murmurs or rubs. Respiratory: CTA bilaterally, no w/r/r. Normal respiratory effort. Abdomen: soft, positive suprapubic tenderness, no guarding nd,  Skin: no rash or induration seen on limited exam Musculoskeletal: grossly normal tone BUE/BLE Psychiatric: Unable to obtain secondary to altered mental status Neurologic: No facial asymmetry, moves extremities equally           Labs on Admission:  Basic Metabolic Panel:  Recent Labs Lab 01/24/15 1445  NA 134*  K 3.5  CL 98*  CO2 26  GLUCOSE 108*  BUN 26*  CREATININE 1.30*  CALCIUM 11.0*   Liver Function Tests: No results for input(s): AST, ALT, ALKPHOS, BILITOT, PROT, ALBUMIN in the last 168 hours. No results for input(s): LIPASE, AMYLASE in the last 168 hours. No results for input(s): AMMONIA in the last 168 hours. CBC:  Recent Labs Lab 01/24/15 1445  WBC 9.5  NEUTROABS 7.2  HGB 12.0  HCT 35.9*  MCV 93.7  PLT 215   Cardiac Enzymes: No results for input(s): CKTOTAL, CKMB, CKMBINDEX, TROPONINI in the last 168 hours.  BNP (last 3 results) No results for input(s): BNP in the last 8760 hours.  ProBNP (last 3 results) No results for input(s): PROBNP in the last 8760 hours.  CBG: No  results for input(s): GLUCAP in the last 168 hours.  Radiological Exams on Admission: Dg Chest 2 View  01/24/2015   CLINICAL DATA:  Productive cough and fatigue  EXAM: CHEST  2 VIEW  COMPARISON:  August 24, 2009  FINDINGS: There is mild atelectasis in the left base. Lungs elsewhere clear. Heart size and pulmonary vascularity are normal. No adenopathy. No bone lesions.  IMPRESSION: Mild atelectasis left base.  Lungs elsewhere clear.   Electronically Signed   By: Bretta Bang III M.D.   On: 01/24/2015 15:50    EKG: Independently reviewed. Sinus rhythm with no ST elevations or depressions  Assessment/Plan Principal Problem:   Metabolic encephalopathy -We'll provide IV antibiotics and IV fluid rehydration. Most likely  cause secondary to dehydration active infection   Active Problems:   Dehydration -We'll plan on providing IV fluid rehydration    UTI (lower urinary tract infection) -Rocephin, urine culture and sensitivities    Hyponatremia -We'll place on normal saline and reassess levels next a.m. -Most likely secondary to poor oral solute intake  Code Status: full DVT Prophylaxis: Heparin Family Communication: None at bedside Disposition Plan: Pending improvement in condition.  Time spent: > 45 minutes  Penny Pia [facility] Pager [phone]   Was recently paged by nursing at 1739 that patient went into SVT. ED physician responded and reports that patient is not in SVT.  Will transfer to telemetry for monitoring.  Will obtain magnesium and phosphorus levels.

## 2015-01-24 NOTE — ED Notes (Addendum)
Patient had two episodes of SVT that lasted 30 seconds - 1 min each.  Pt converted without intervention and is now in NSR with PVC's.  Dr. Gwendolyn GrantWalden notified and at bedside to assess patient.  Pt remained awake and responsive through both episodes.  Dr Cena BentonVega notified and admission orders changed to admit to telemetry bed.

## 2015-01-24 NOTE — ED Notes (Signed)
MD at bedside- Cena BentonVega

## 2015-01-24 NOTE — ED Notes (Signed)
[facility]  Per EMS, pt has been c/o back pain today.  Pt also more tired than normal today.  Pt was dx with UTI two weeks ago.

## 2015-01-24 NOTE — ED Provider Notes (Signed)
CSN: [id number]     Arrival date & time 01/24/15  1413 History   First MD Initiated Contact with Patient 01/24/15 1457     Chief Complaint  Patient presents with  . Back Pain    Back pain and increased tiredness today.      (Consider location/radiation/quality/duration/timing/severity/associated sxs/prior Treatment) HPI Comments: Per Crystal at [facility] -  Significant change today - normally walks, chatty, does all of her daily activities except showering. Today has been confused, weak, unable to walk, acting disoriented. Diagnosed with UTI 2 weeks ago. History of similar AMS with previous UTI, but remote, not recent.  Patient is a 79 y.o. female presenting with back pain. The history is provided by the nursing home.  Back Pain Location:  Gluteal region Quality:  Aching Radiates to:  Does not radiate Pain severity:  Moderate Onset quality:  Gradual Timing:  Constant Progression:  Unchanged Chronicity:  New Relieved by:  Nothing Worsened by:  Nothing tried Associated symptoms: no abdominal pain and no fever     Past Medical History  Diagnosis Date  . Dementia   . Anxiety   . MDD (major depressive disorder)   . Hypertension   . Head ache   . Tremor   . Osteoarthritis    Past Surgical History  Procedure Laterality Date  . Abdominal hysterectomy    . Hemorroidectomy    . Pterygium excision    . Foot surgery      right   History reviewed. No pertinent family history. History  Substance Use Topics  . Smoking status: Unknown If Ever Smoked  . Smokeless tobacco: Not on file  . Alcohol Use: No   OB History    No data available     Review of Systems  Unable to perform ROS: Dementia  Constitutional: Negative for fever.  Respiratory: Negative for shortness of breath.   Gastrointestinal: Negative for vomiting and abdominal pain.  Musculoskeletal: Positive for back pain.  All other systems reviewed and are negative.     Allergies  Review of patient's  allergies indicates no known allergies.  Home Medications   Prior to Admission medications   Medication Sig Start Date End Date Taking? Authorizing Provider  acetaminophen (TYLENOL) 500 MG tablet Take 500 mg by mouth every 4 (four) hours as needed. For pain    Historical Provider, MD  alendronate (FOSAMAX) 70 MG tablet Take 70 mg by mouth once a week. Take with a full glass of water on an empty stomach. Takes on Mondays    Historical Provider, MD  alum & mag hydroxide-simeth (MAALOX/MYLANTA) 200-200-20 MG/5ML suspension Take 30 mLs by mouth every 6 (six) hours as needed. For indigestion    Historical Provider, MD  Calcium Carbonate-Vitamin D (CALCARB 600/D) 600-400 MG-UNIT per tablet Take 1 tablet by mouth daily.    Historical Provider, MD  clonazePAM (KLONOPIN) 1 MG tablet Take 1 mg by mouth 2 (two) times daily.     Historical Provider, MD  cycloSPORINE (RESTASIS) 0.05 % ophthalmic emulsion Place 1 drop into both eyes 2 (two) times daily.    Historical Provider, MD  divalproex (DEPAKOTE) 250 MG DR tablet Take 250 mg by mouth 3 (three) times daily.     Historical Provider, MD  docusate sodium (COLACE) 100 MG capsule Take 100 mg by mouth 2 (two) times daily. For constipation    Historical Provider, MD  DULoxetine (CYMBALTA) 60 MG capsule Take 60 mg by mouth 2 (two) times daily.    Historical  Provider, MD  gabapentin (NEURONTIN) 600 MG tablet Take 600 mg by mouth 4 (four) times daily.    Historical Provider, MD  HYDROcodone-acetaminophen (NORCO) 7.5-325 MG per tablet Take 1 tablet by mouth 4 (four) times daily as needed for moderate pain.    Historical Provider, MD  loperamide (IMODIUM) 2 MG capsule Take 2 mg by mouth as needed for diarrhea or loose stools.    Historical Provider, MD  losartan-hydrochlorothiazide (HYZAAR) 50-12.5 MG per tablet Take 1 tablet by mouth daily.    Historical Provider, MD  lurasidone (LATUDA) 40 MG TABS tablet Take 40 mg by mouth daily with lunch.     Historical  Provider, MD  magnesium hydroxide (MILK OF MAGNESIA) 400 MG/5ML suspension Take 30 mLs by mouth daily as needed for mild constipation.    Historical Provider, MD  Melatonin 3 MG TABS Take 3 mg by mouth at bedtime.    Historical Provider, MD  meloxicam (MOBIC) 15 MG tablet Take 15 mg by mouth daily.    Historical Provider, MD  omeprazole (PRILOSEC) 20 MG capsule Take 20 mg by mouth daily after breakfast.    Historical Provider, MD  QUEtiapine (SEROQUEL) 25 MG tablet Take 25 mg by mouth at bedtime.    Historical Provider, MD  sertraline (ZOLOFT) 50 MG tablet Take 50 mg by mouth daily.    Historical Provider, MD  valACYclovir (VALTREX) 500 MG tablet Take 500 mg by mouth daily.    Historical Provider, MD   BP 112/45 mmHg  Pulse 73  Resp 15  SpO2 91% Physical Exam  Constitutional: She appears well-developed and well-nourished. No distress.  HENT:  Head: Normocephalic and atraumatic.  Mouth/Throat: Oropharynx is clear and moist.  Eyes: EOM are normal. Pupils are equal, round, and reactive to light.  Neck: Normal range of motion. Neck supple.  Cardiovascular: Normal rate and regular rhythm.  Exam reveals no friction rub.   No murmur heard. Pulmonary/Chest: Effort normal and breath sounds normal. No respiratory distress. She has no wheezes. She has no rales.  Abdominal: Soft. She exhibits no distension. There is no tenderness. There is no rebound.  Musculoskeletal: Normal range of motion. She exhibits no edema.       Cervical back: She exhibits no tenderness and no bony tenderness.       Thoracic back: She exhibits no tenderness and no bony tenderness.       Lumbar back: She exhibits no tenderness and no bony tenderness.       Back:  Neurological: She is alert.  Skin: No rash noted. She is not diaphoretic.  Nursing note and vitals reviewed.   ED Course  Procedures (including critical care time) Labs Review Labs Reviewed  CBC WITH DIFFERENTIAL/PLATELET - Abnormal; Notable for the  following:    RBC 3.83 (*)    HCT 35.9 (*)    All other components within normal limits  URINE CULTURE  BASIC METABOLIC PANEL  URINALYSIS, ROUTINE W REFLEX MICROSCOPIC (NOT AT [facility])    Imaging Review Dg Chest 2 View  01/24/2015   CLINICAL DATA:  Productive cough and fatigue  EXAM: CHEST  2 VIEW  COMPARISON:  August 24, 2009  FINDINGS: There is mild atelectasis in the left base. Lungs elsewhere clear. Heart size and pulmonary vascularity are normal. No adenopathy. No bone lesions.  IMPRESSION: Mild atelectasis left base.  Lungs elsewhere clear.   Electronically Signed   By: Bretta BangWilliam  Woodruff III M.D.   On: 01/24/2015 15:50     EKG  Interpretation   Date/Time:  Wednesday January 24 2015 15:50:41 EDT Ventricular Rate:  74 PR Interval:  197 QRS Duration: 102 QT Interval:  387 QTC Calculation: 429 R Axis:   -12 Text Interpretation:  Sinus rhythm Probable left atrial enlargement No  significant changes since last tracing Confirmed by Gwendolyn Grant  MD, Lebert Lovern  ([id number]) on 01/24/2015 4:00:08 PM      MDM   Final diagnoses:  Hypoxia  UTI (lower urinary tract infection)  Delirium    89F here with altered mental status. Similar to prior when she had UTI.  UTI noted here. Patient given Rocephin, admitted. Vitals stable.    Elwin Mocha, MD 01/24/15 [phone]

## 2015-01-25 ENCOUNTER — Encounter (HOSPITAL_COMMUNITY): Payer: Self-pay

## 2015-01-25 DIAGNOSIS — L8990 Pressure ulcer of unspecified site, unspecified stage: Secondary | ICD-10-CM | POA: Diagnosis present

## 2015-01-25 LAB — BASIC METABOLIC PANEL
Anion gap: 11 (ref 5–15)
BUN: 19 mg/dL (ref 6–20)
CHLORIDE: 100 mmol/L — AB (ref 101–111)
CO2: 26 mmol/L (ref 22–32)
Calcium: 10.5 mg/dL — ABNORMAL HIGH (ref 8.9–10.3)
Creatinine, Ser: 0.94 mg/dL (ref 0.44–1.00)
GFR calc Af Amer: >60 mL/min (ref >60)
GFR calc non Af Amer: 57 mL/min — ABNORMAL LOW (ref >60)
Glucose, Bld: 105 mg/dL — ABNORMAL HIGH (ref 65–99)
POTASSIUM: 3.3 mmol/L — AB (ref 3.5–5.1)
Sodium: 137 mmol/L (ref 135–145)

## 2015-01-25 LAB — CBC
HEMATOCRIT: 38.4 % (ref 36.0–46.0)
HEMOGLOBIN: 12.5 g/dL (ref 12.0–15.0)
MCH: 31 pg (ref 26.0–34.0)
MCHC: 32.6 g/dL (ref 30.0–36.0)
MCV: 95.3 fL (ref 78.0–100.0)
Platelets: 254 10*3/uL (ref 150–400)
RBC: 4.03 MIL/uL (ref 3.87–5.11)
RDW: 12.9 % (ref 11.5–15.5)
WBC: 6.7 10*3/uL (ref 4.0–10.5)

## 2015-01-25 MED ORDER — POTASSIUM CHLORIDE CRYS ER 20 MEQ PO TBCR
20.0000 meq | EXTENDED_RELEASE_TABLET | Freq: Once | ORAL | Status: AC
  Administered 2015-01-25: 20 meq via ORAL
  Filled 2015-01-25: qty 1

## 2015-01-25 NOTE — Clinical Social Work Note (Signed)
Clinical Social Work Assessment  Patient Details  Name: [name] [name] MRN: [id number] Date of Birth: Jan 05, 1936  Date of referral:  01/25/15               Reason for consult:  Discharge Planning                Permission sought to share information with:  Family Supports Permission granted to share information::  Yes, Verbal Permission Granted  Name::     [name] [name]  Agency::     Relationship::  daughter  Contact Information:  [phone]  Housing/Transportation Living arrangements for the past 2 months:  Assisted Living Facility Source of Information:  Adult Children Patient Interpreter Needed:  None Criminal Activity/Legal Involvement Pertinent to Current Situation/Hospitalization:  No - Comment as needed Significant Relationships:  Adult Children Lives with:  Facility Resident Do you feel safe going back to the place where you live?  Yes Need for family participation in patient care:  Yes (Comment)  Care giving concerns:  Pt admitted from [facility]. Pt daughter reports satisfaction with [facility].    Social Worker assessment / plan:  CSW received referral that pt admitted from [place] [facility].   CSW received return phone call from pt daughter, [name] [name] via telephone. Pt daughter confirmed that pt is a resident at [facility]. Pt daughter reports that pt has been a resident at [facility] for 3 years. Pt daughter expressed that plan will be for pt to return to [facility] when medically ready for discharge.  CSW provided support to pt daughter as pt daughter discussed that she wanted to notify CSW and nursing staff that pt son [name] visit pt and pt son often makes pt upset. Pt daughter expressed that pt son suffers from mental health issues and has trouble understanding that pt can no longer take care of him. CSW inquired with pt daughter if she wanted to make pt's location in the hospital confidential and pt daughter stated that she  did not want to forbid pt son from seeing pt, but wanted staff to be aware that it is likely that pt son will cause her to be upset if he visits. CSW notified Geographical information systems officer and Charity fundraiser.   CSW contacted [facility] and left message with RN, Tammy. Awaiting return phone call.   CSW to continue to follow to provide support and assist with pt discharge planning needs back to [facility] once medically ready for discharge.   Employment status:    Insurance information:  Medicare PT Recommendations:  Not assessed at this time Information / Referral to community resources:  Other (Comment Required) (Referral back to [facility])  Patient/Family's Response to care:  Pt alert and oriented x 1. Pt daughter supportive and actively involved in pt care. Pt daughter states that she notified pt son of pt admission to the hospital, but did not notify pt son of pt location in the hospital and concerned that pt son will upset pt, but wanted nursing staff to be aware. Pt daughter wishes for pt to return to [facility].   Patient/Family's Understanding of and Emotional Response to Diagnosis, Current Treatment, and Prognosis:  Pt daughter displays good understanding about diagnosis and treatment plan, but eager to know pt anticipated length of stay.   Emotional Assessment Appearance:  Appears stated age Attitude/Demeanor/Rapport:  Unable to Assess (pt oriented to person only) Affect (typically observed):  Unable to Assess (pt  oriented to person only) Orientation:  Oriented to Self Alcohol / Substance use:  Not Applicable Psych involvement (Current and /or in the community):  No (Comment)  Discharge Needs  Concerns to be addressed:  Discharge Planning Concerns Readmission within the last 30 days:  No Current discharge risk:  None Barriers to Discharge:  Continued Medical Work up   Elenora Hawbaker, SUZANNOrson Eva A, LCSW 01/25/2015, 3:28 PM  [phone]

## 2015-01-25 NOTE — Progress Notes (Signed)
[facility] PROGRESS NOTE  Joya GaskinsCharlotte Weidner QMV:[id number] DOB: [birth year] DOA: 01/24/2015 PCP: Ron ParkerBOWEN,SAMUEL, MD  Assessment/Plan: Principal Problem:   Metabolic encephalopathy -In context of patient with history of dementia residing with UTI. As such I suspect this is secondary to infectious etiology. Improving.  Active Problems:   Dehydration -Improving with IV fluids and improved oral intake. - We'll continue maintenance IV fluids    UTI (lower urinary tract infection) -Continue current antibiotics regimen -Follow-up with urine culture    Hyponatremia -Resolved with improved oral intake  Hypokalemia - Replace orally and reassess next a.m.  Code Status: full Family Communication: no family at bedside  Disposition Plan: Pending improvement in condition.   Consultants:  none  Procedures:  none  Antibiotics:  Rocephin  HPI/Subjective:  patient is still confused but more alert than initial presentation   Objective: Filed Vitals:   01/25/15 1253  BP: 122/50  Pulse: 90  Temp: 97.8 F (36.6 C)  Resp: 20    Intake/Output Summary (Last 24 hours) at 01/25/15 1833 Last data filed at 01/25/15 1500  Gross per 24 hour  Intake 2206.67 ml  Output    400 ml  Net 1806.67 ml   Filed Weights   01/24/15 1924  Weight: 74.526 kg (164 lb 4.8 oz)    Exam:   General:  Patient in no acute distress, alert and awake  Cardiovascular: Regular rate and rhythm, no murmurs or rubs   Respiratory:  clear to auscultation bilaterally, no wheezes  Abdominal: Soft, nondistended, positive suprapubic discomfort  Musculoskeletal: no cyanosis or clubbing  Data Reviewed: Basic Metabolic Panel:  Recent Labs Lab 01/24/15 1445 01/24/15 1700 01/24/15 2049 01/25/15 0516  NA 134*  --   --  137  K 3.5  --   --  3.3*  CL 98*  --   --  100*  CO2 26  --   --  26  GLUCOSE 108*  --   --  105*  BUN 26*  --   --  19  CREATININE 1.30*  --  0.96 0.94  CALCIUM 11.0*  --   --   10.5*  MG  --  2.1  --   --   PHOS  --   --  3.2  --    Liver Function Tests: No results for input(s): AST, ALT, ALKPHOS, BILITOT, PROT, ALBUMIN in the last 168 hours. No results for input(s): LIPASE, AMYLASE in the last 168 hours. No results for input(s): AMMONIA in the last 168 hours. CBC:  Recent Labs Lab 01/24/15 1445 01/24/15 2049 01/25/15 0516  WBC 9.5 8.3 6.7  NEUTROABS 7.2  --   --   HGB 12.0 13.1 12.5  HCT 35.9* 38.2 38.4  MCV 93.7 92.9 95.3  PLT 215 215 254   Cardiac Enzymes: No results for input(s): CKTOTAL, CKMB, CKMBINDEX, TROPONINI in the last 168 hours. BNP (last 3 results) No results for input(s): BNP in the last 8760 hours.  ProBNP (last 3 results) No results for input(s): PROBNP in the last 8760 hours.  CBG: No results for input(s): GLUCAP in the last 168 hours.  Recent Results (from the past 240 hour(s))  Urine culture     Status: None (Preliminary result)   Collection Time: 01/24/15  2:38 PM  Result Value Ref Range Status   Specimen Description URINE, CATHETERIZED  Final   Special Requests NONE  Final   Culture   Final    TOO YOUNG TO READ [facility]formed at [facility]  Report Status PENDING  Incomplete     Studies: Dg Chest 2 View  01/24/2015   CLINICAL DATA:  Productive cough and fatigue  EXAM: CHEST  2 VIEW  COMPARISON:  August 24, 2009  FINDINGS: There is mild atelectasis in the left base. Lungs elsewhere clear. Heart size and pulmonary vascularity are normal. No adenopathy. No bone lesions.  IMPRESSION: Mild atelectasis left base.  Lungs elsewhere clear.   Electronically Signed   By: Bretta Bang III M.D.   On: 01/24/2015 15:50    Scheduled Meds: . cefTRIAXone (ROCEPHIN)  IV  1 g Intravenous Q24H  . cycloSPORINE  1 drop Both Eyes BID  . divalproex  250 mg Oral TID  . DULoxetine  60 mg Oral BID  . heparin  5,000 Units Subcutaneous 3 times per day  . lurasidone  40 mg Oral Q lunch  . pantoprazole  40 mg Oral Daily  .  QUEtiapine  25 mg Oral QHS  . sertraline  50 mg Oral Daily  . valACYclovir  500 mg Oral Daily   Continuous Infusions: . dextrose 5 % and 0.9% NaCl 100 mL/hr at 01/25/15 1007    Time spent: > 35 minutes   Penny Pia  [facility] Pager [phone]. If 7PM-7AM, please contact night-coverage at [email or web address], password [facility] 01/25/2015, 6:33 PM  LOS: 1 day

## 2015-01-25 NOTE — Progress Notes (Signed)
CSW received [facility].   CSW visited pt room and pt alert and oriented to person only. No family present at bedside.  CSW contacted pt daughter, Bonita QuinLinda via telephone and left voice message. Awaiting return phone call.   [facility] for RN at [facility].   CSW to continue to follow and complete full psychosocial assessment once able to speak with pt daughter.   Loletta SpecterSuzanna Tanasha Menees, MSW, LCSW Clinical Social Work [phone]

## 2015-01-26 DIAGNOSIS — F0391 Unspecified dementia with behavioral disturbance: Secondary | ICD-10-CM

## 2015-01-26 DIAGNOSIS — E876 Hypokalemia: Secondary | ICD-10-CM

## 2015-01-26 DIAGNOSIS — G9341 Metabolic encephalopathy: Secondary | ICD-10-CM

## 2015-01-26 DIAGNOSIS — E871 Hypo-osmolality and hyponatremia: Secondary | ICD-10-CM

## 2015-01-26 DIAGNOSIS — N390 Urinary tract infection, site not specified: Principal | ICD-10-CM

## 2015-01-26 DIAGNOSIS — F03918 Unspecified dementia, unspecified severity, with other behavioral disturbance: Secondary | ICD-10-CM | POA: Diagnosis present

## 2015-01-26 DIAGNOSIS — E860 Dehydration: Secondary | ICD-10-CM

## 2015-01-26 LAB — BASIC METABOLIC PANEL
Anion gap: 9 (ref 5–15)
BUN: 6 mg/dL (ref 6–20)
CO2: 27 mmol/L (ref 22–32)
CREATININE: 0.55 mg/dL (ref 0.44–1.00)
Calcium: 9.7 mg/dL (ref 8.9–10.3)
Chloride: 103 mmol/L (ref 101–111)
GFR calc non Af Amer: >60 mL/min (ref >60)
GLUCOSE: 107 mg/dL — AB (ref 65–99)
Potassium: 3.2 mmol/L — ABNORMAL LOW (ref 3.5–5.1)
SODIUM: 139 mmol/L (ref 135–145)

## 2015-01-26 MED ORDER — ENOXAPARIN SODIUM 40 MG/0.4ML ~~LOC~~ SOLN
40.0000 mg | SUBCUTANEOUS | Status: DC
  Administered 2015-01-26: 40 mg via SUBCUTANEOUS
  Filled 2015-01-26: qty 0.4

## 2015-01-26 MED ORDER — POTASSIUM CHLORIDE CRYS ER 20 MEQ PO TBCR
20.0000 meq | EXTENDED_RELEASE_TABLET | Freq: Three times a day (TID) | ORAL | Status: AC
  Administered 2015-01-26 (×3): 20 meq via ORAL
  Filled 2015-01-26 (×3): qty 1

## 2015-01-26 NOTE — Progress Notes (Signed)
CSW continuing to follow.  [facility].   PT assessed pt today and pt was requiring one person assist and did not ambulate a far distance.   CSW contacted [facility] and spoke with RN, Tammy. CSW sent [facility] PT evaluation and clinicals for review. Per [facility], pt was independent prior to admission with ADL's except for needing assistance to shower and ALF states that if pt continues to need increased assistance then ALF recommends short term rehab. [facility] asked that if PT works with pt again then facility ask for updated PT note in case pt has improved and can return as pt has been at the facility for three years. [facility] states that if pt does have to go to short term rehab then facility can accept pt back following short term rehab stay.   CSW contacted pt daughter, Bonita QuinLinda via telephone. CSW updated pt daughter regarding the amount of assistance that pt is requiring at this time and that [facility] states that if pt does not improve prior to discharge then pt will need short term rehab before returning. Pt daughter expressed understanding and agreeable to [facility]. Pt daughter states that she would feel most comfortable with pt being in a SNF with a wander guard system or memory care unit as when pt is able to ambulate she enjoys walking around the facility. Pt daughter reports that she is familiar with [facility] [place].   CSW completed FL2 and initiated SNF search to [facility] including inquiring about wander guard system in the facility. CSW saved a SNF pasarr and will submit once confirmed that pt planned to d/c to SNF. 30-day note placed on shadow chart for MD signature. CSW contacted [facility] and left voice message regarding pt daughter interest in facility.   CSW to follow up with pt daughter regarding SNF bed offers.   CSW to continue to  follow to provide support and assist with pt disposition planning.   Loletta SpecterSuzanna Jakyle Petrucelli, MSW, LCSW Clinical Social Work [phone]

## 2015-01-26 NOTE — Evaluation (Signed)
Physical Therapy Evaluation Patient Details Name: Corinn Stoltzfus MRN: [id number] DOB: 07/10/1936 Today's Date: 01/26/2015   History of Present Illness  79 yo female admitted with metabolic encephalopathy, fall, UTI. Hx of dementia, MDD, HTN, Oa. Pt is from ALF-memory care unit.   Clinical Impression  On eval, pt required Min assist for mobility-able to take perform stand pivot and take a few steps with RW. Pt is fearful of falling. Repeatedly calls/yells out for help. Recommend HHPT vs SNF depending on facility's ability to provide current level of care/assist.     Follow Up Recommendations Home health PT;Supervision/Assistance - 24 hour (at ALF as long as facility is able to provide increased /current level of assistance. Otherwise, will need to consider SNF)    Equipment Recommendations  Rolling walker with 5" wheels    Recommendations for Other Services       Precautions / Restrictions Precautions Precautions: Fall Restrictions Weight Bearing Restrictions: No      Mobility  Bed Mobility               General bed mobility comments: pt oob in recliner  Transfers Overall transfer level: Needs assistance Equipment used: Rolling walker (2 wheeled) Transfers: Sit to/from [employer] Sit to Stand: Min assist Stand pivot transfers: Min assist       General transfer comment: assist to rise, stabilize, control descent. Stand pivot to [facility] with RW.  Ambulation/Gait   Ambulation Distance (Feet): 4 Feet Assistive device: Rolling walker (2 wheeled) Gait Pattern/deviations: Step-through pattern;Decreased stride length     General Gait Details: Assist to stabilize. Pt fearful of falling so did not push pt to walk any farther at this time. Unsteady.  Stairs            Wheelchair Mobility    Modified Rankin (Stroke Patients Only)       Balance Overall balance assessment: Needs assistance;History of Falls         Standing balance support:  Bilateral upper extremity supported;During functional activity Standing balance-Leahy Scale: Poor Standing balance comment: needs RW                             Pertinent Vitals/Pain Pain Assessment: No/denies pain    Home Living       Type of Home: Assisted living Home Access: Level entry     Home Layout: One level Home Equipment: None      Prior Function                 Hand Dominance        Extremity/Trunk Assessment   Upper Extremity Assessment: Generalized weakness           Lower Extremity Assessment: Generalized weakness      Cervical / Trunk Assessment: Kyphotic  Communication   Communication: No difficulties  Cognition Arousal/Alertness: Awake/alert Behavior During Therapy: Anxious Overall Cognitive Status: History of cognitive impairments - at baseline (however different from baseline)                      General Comments      Exercises        Assessment/Plan    PT Assessment Patient needs continued PT services  PT Diagnosis Difficulty walking;Generalized weakness;Altered mental status   PT Problem List Decreased strength;Decreased activity tolerance;Decreased balance;Decreased mobility;Decreased cognition;Decreased safety awareness  PT Treatment Interventions DME instruction;Gait training;Functional mobility training;Therapeutic activities;Therapeutic exercise;Patient/family education;Balance training   PT Goals (  Current goals can be found in the Care Plan section) Acute Rehab PT Goals Patient Stated Goal: none stated PT Goal Formulation: Patient unable to participate in goal setting Time For Goal Achievement: 02/09/15 Potential to Achieve Goals: Good    Frequency Min 3X/week   Barriers to discharge        Co-evaluation               End of Session   Activity Tolerance:  (Limited by cognition/fear of falling) Patient left: in chair;with call bell/phone within reach;with chair alarm set            Time: [id number] PT Time Calculation (min) (ACUTE ONLY): 22 min   Charges:   PT Evaluation $Initial PT Evaluation Tier I: 1 Procedure     PT G Codes:        Rebeca AlertJannie Graclynn Vanantwerp, MPT Pager: [phone]

## 2015-01-26 NOTE — Clinical Social Work Placement (Signed)
   CLINICAL SOCIAL WORK PLACEMENT  NOTE  Date:  01/26/2015  Patient Details  Name: [name] [name] MRN: [id number] Date of Birth: [birth year]  Clinical Social Work is seeking post-discharge placement for this patient at the Skilled  Nursing Facility level of care (*CSW will initial, date and re-position this form in  chart as items are completed):  Yes   Patient/family provided with [place] Clinical Social Work Department's list of facilities offering this level of care within the geographic area requested by the patient (or if unable, by the patient's family).  Yes   Patient/family informed of their freedom to choose among providers that offer the needed level of care, that participate in Medicare, Medicaid or managed care program needed by the patient, have an available bed and are willing to accept the patient.  Yes   Patient/family informed of [place]'s ownership interest in [facility] and [facility], as well as of the fact that they are under no obligation to receive care at these facilities.  PASRR submitted to EDS on       PASRR number received on       Existing PASRR number confirmed on       FL2 transmitted to all facilities in geographic area requested by pt/family on 01/26/15     FL2 transmitted to all facilities within larger geographic area on       Patient informed that his/her managed care company has contracts with or will negotiate with certain facilities, including the following:            Patient/family informed of bed offers received.  Patient chooses bed at       Physician recommends and patient chooses bed at      Patient to be transferred to   on  .  Patient to be transferred to facility by       Patient family notified on   of transfer.  Name of family member notified:        PHYSICIAN Please sign FL2     Additional Comment:   SNF pasarr saved in [place] MUST-will submit once confirm plan for SNF. 30 day note on shadow chart for MD  signature _______________________________________________ [name], Khylee Algeo A, LCSW 01/26/2015, 4:31 PM

## 2015-01-26 NOTE — Progress Notes (Signed)
Utilization review completed.  

## 2015-01-26 NOTE — Progress Notes (Signed)
Noted pt very confused while in her room.

## 2015-01-26 NOTE — Progress Notes (Signed)
Progress Note   [name] GaskinsCharlotte [name] RUE:[id number] DOB: [birth year] DOA: 01/24/2015 PCP: Ron ParkerBOWEN,SAMUEL, MD   Brief Narrative:   [name] GaskinsCharlotte Newbrough is an 79 y.o. female with a PMH of dementia and depression/anxiety with a chronic tremor who was admitted 01/24/15 with acute encephalopathy in the setting of a urinary tract infection.  Assessment/Plan:   Principal Problem:   Metabolic encephalopathy - Continues to be very confused. We'll discontinue telemetry and continue to attempt to reorient the patient.  Active Problems:   Dementia with behavioral disturbance  - Continue Depakote, Latuda, Cymbalta, Seroquel and Zoloft.    Dehydration - Continue IV fluids.    UTI (lower urinary tract infection) - Continue Rocephin. Follow-up urine cultures.    Hyponatremia - Resolved.    Hypokalemia - Replete.     DVT Prophylaxis - Lovenox ordered.  Family Communication: No family at the bedside. Disposition Plan: From an assisted living facility, possibly return there in the next 24-48 hours pending culture results. Code Status:     Code Status Orders        Start     Ordered   01/24/15 1927  Full code   Continuous     01/24/15 1926        IV Access:    Peripheral IV   Procedures and diagnostic studies:   Dg Chest 2 View  01/24/2015   CLINICAL DATA:  Productive cough and fatigue  EXAM: CHEST  2 VIEW  COMPARISON:  August 24, 2009  FINDINGS: There is mild atelectasis in the left base. Lungs elsewhere clear. Heart size and pulmonary vascularity are normal. No adenopathy. No bone lesions.  IMPRESSION: Mild atelectasis left base.  Lungs elsewhere clear.   Electronically Signed   By: Bretta BangWilliam  Woodruff III M.D.   On: 01/24/2015 15:50     Medical Consultants:    None.  Anti-Infectives:    Rocephin 01/24/15--->  Subjective:    [name] GaskinsCharlotte Kinkaid has been agitated, calling out to the nurses, very anxious. No specific complaints otherwise. Bowels moving.  Objective:     Filed Vitals:   01/25/15 0555 01/25/15 1253 01/25/15 2026 01/26/15 0500  BP: 120/54 122/50 135/60 121/65  Pulse: 70 90 75 73  Temp: 98 F (36.7 C) 97.8 F (36.6 C) 97.6 F (36.4 C) 97.6 F (36.4 C)  TempSrc: Oral Axillary Oral Oral  Resp: 16 20 20 18   Height:      Weight:      SpO2: 98% 98% 98% 97%    Intake/Output Summary (Last 24 hours) at 01/26/15 0832 Last data filed at 01/26/15 0600  Gross per 24 hour  Intake 3996.67 ml  Output    650 ml  Net 3346.67 ml    Exam: Gen:  Anxious, disoriented, sitting up in chair Cardiovascular:  RRR, No M/R/G Respiratory:  Lungs CTAB Gastrointestinal:  Abdomen soft, NT/ND, + BS Extremities:  No C/E/C   Data Reviewed:    Labs: Basic Metabolic Panel:  Recent Labs Lab 01/24/15 1445 01/24/15 1700 01/24/15 2049 01/25/15 0516 01/26/15 0516  NA 134*  --   --  137 139  K 3.5  --   --  3.3* 3.2*  CL 98*  --   --  100* 103  CO2 26  --   --  26 27  GLUCOSE 108*  --   --  105* 107*  BUN 26*  --   --  19 6  CREATININE 1.30*  --  0.96 0.94 0.55  CALCIUM 11.0*  --   --  10.5* 9.7  MG  --  2.1  --   --   --   PHOS  --   --  3.2  --   --    GFR Estimated Creatinine Clearance: 58.2 mL/min (by C-G formula based on Cr of 0.55).  CBC:  Recent Labs Lab 01/24/15 1445 01/24/15 2049 01/25/15 0516  WBC 9.5 8.3 6.7  NEUTROABS 7.2  --   --   HGB 12.0 13.1 12.5  HCT 35.9* 38.2 38.4  MCV 93.7 92.9 95.3  PLT 215 215 254   Sepsis Labs:  Recent Labs Lab 01/24/15 1445 01/24/15 1638 01/24/15 2049 01/25/15 0516  WBC 9.5  --  8.3 6.7  LATICACIDVEN  --  0.74  --   --    Microbiology Recent Results (from the past 240 hour(s))  Urine culture     Status: None (Preliminary result)   Collection Time: 01/24/15  2:38 PM  Result Value Ref Range Status   Specimen Description URINE, CATHETERIZED  Final   Special Requests NONE  Final   Culture   Final    TOO YOUNG TO READ Performed at [facility]    Report Status PENDING   Incomplete     Medications:   . cefTRIAXone (ROCEPHIN)  IV  1 g Intravenous Q24H  . cycloSPORINE  1 drop Both Eyes BID  . divalproex  250 mg Oral TID  . DULoxetine  60 mg Oral BID  . heparin  5,000 Units Subcutaneous 3 times per day  . lurasidone  40 mg Oral Q lunch  . pantoprazole  40 mg Oral Daily  . potassium chloride  20 mEq Oral TID  . QUEtiapine  25 mg Oral QHS  . sertraline  50 mg Oral Daily  . valACYclovir  500 mg Oral Daily   Continuous Infusions: . dextrose 5 % and 0.9% NaCl 100 mL/hr at 01/26/15 0538    Time spent: 25 minutes.   LOS: 2 days   RAMA,CHRISTINA  [facility] Pager [phone]. If unable to reach me by pager, please call my cell phone at [phone].  *Please refer to [email or web address], password [email or web address] to get updated schedule on who will round on this patient, as hospitalists switch teams weekly. If 7PM-7AM, please contact night-coverage at [email or web address], password [email or web address] for any overnight needs.  01/26/2015, 8:32 AM

## 2015-01-27 DIAGNOSIS — Z79899 Other long term (current) drug therapy: Secondary | ICD-10-CM

## 2015-01-27 LAB — URINE CULTURE

## 2015-01-27 LAB — CLOSTRIDIUM DIFFICILE BY PCR: Toxigenic C. Difficile by PCR: NEGATIVE

## 2015-01-27 MED ORDER — CEFUROXIME AXETIL 500 MG PO TABS: 500.0000 mg | ORAL_TABLET | Freq: Two times a day (BID) | ORAL | Status: DC

## 2015-01-27 MED ORDER — CLONAZEPAM 0.5 MG PO TABS: 0.5000 mg | ORAL_TABLET | Freq: Two times a day (BID) | ORAL | Status: DC

## 2015-01-27 NOTE — Clinical Social Work Note (Signed)
CSW received a call from Md that pt was ready for discharge today  CSW called pt's ALF[facility]  (Debbie/liason and Tyrann/RN) who stated that they were willing to take pt back to facility today.   CSW called and spoke with pt's daughter who stated that she was glad pt was going back to her ALF  CSW prepared discharge packet, provided it to RN and called for pt transport  .[name] [name] [name] Cummiskey, LCSW [facility] Clinical Social Worker - Weekend Coverage cell #: [phone]

## 2015-01-27 NOTE — Discharge Summary (Signed)
Physician Discharge Summary  [name] [name] ZOX:[id number] DOB: 1936/07/06 DOA: 01/24/2015  PCP: [name], MD  Admit date: 01/24/2015 Discharge date: 01/27/2015   Recommendations for Outpatient Follow-Up:   1. Recommend close follow-up with PCP to evaluate polypharmacy and consider reducing dosages/simplification of medication regimen.   Discharge Diagnosis:   Principal Problem:    Metabolic encephalopathy in the setting of a UTI, polypharmacy and dementia with behavioral disturbance Active Problems:    Dehydration    UTI (lower urinary tract infection)    Hyponatremia    Pressure ulcer    Dementia with behavioral disturbance    Hypokalemia    Polypharmacy   Discharge disposition:  ALF  Discharge Condition: Improved.  Diet recommendation: Low sodium, heart healthy.    History of Present Illness:   [name] [name] is an 79 y.o. female with a PMH of dementia and depression/anxiety with a chronic tremor who was admitted 01/24/15 with acute encephalopathy in the setting of a urinary tract infection.  Hospital Course by Problem:   Principal Problem:  Metabolic encephalopathy likely secondary to UTI in the setting of dementia with underlying psychiatric illness - Confusion resolved. - Suspect encephalopathy secondary to strep bovis UTI in the setting of polypharmacy and dementia. - No need for IE workup, discussed with ID. - We'll discharge home on another 5 days of therapy with oral Ceftin.  Active Problems:   Polypharmacy - We have discontinued Neurontin and Norco.  - The patient is on multiple sedating medications and would recommend outpatient evaluation of her chronic medications given her encephalopathy on presentation.   Dementia with behavioral disturbance  - Continue Depakote, Latuda, Cymbalta, Seroquel and Zoloft.   Dehydration - Continue IV fluids.   Hyponatremia - Resolved.   Hypokalemia - Replete.    Medical Consultants:     None.   Discharge Exam:   Filed Vitals:   01/27/15 1313  BP: 137/69  Pulse: 70  Temp: 98 F (36.7 C)  Resp: 20   Filed Vitals:   01/26/15 1304 01/26/15 2028 01/27/15 0528 01/27/15 1313  BP: 138/65 141/64 145/72 137/69  Pulse: 78 80 87 70  Temp: 98.4 F (36.9 C) 98.6 F (37 C) 98 F (36.7 C) 98 F (36.7 C)  TempSrc: Oral Oral Oral Oral  Resp: Height:      Weight:      SpO2: 98% 98% 99% 96%    Gen:  NAD, clear with much less anxiety today Cardiovascular:  RRR, No M/R/G Respiratory: Lungs CTAB Gastrointestinal: Abdomen soft, NT/ND with normal active bowel sounds. Extremities: No C/E/C   The results of significant diagnostics from this hospitalization (including imaging, microbiology, ancillary and laboratory) are listed below for reference.     Procedures and Diagnostic Studies:   Dg Chest 2 View  01/24/2015   CLINICAL DATA:  Productive cough and fatigue  EXAM: CHEST  2 VIEW  COMPARISON:  August 24, 2009  FINDINGS: There is mild atelectasis in the left base. Lungs elsewhere clear. Heart size and pulmonary vascularity are normal. No adenopathy. No bone lesions.  IMPRESSION: Mild atelectasis left base.  Lungs elsewhere clear.   Electronically Signed   By: Bretta Bang III M.D.   On: 01/24/2015 15:50   Dg Hip Unilat With Pelvis 2-3 Views Left  01/22/2015   CLINICAL DATA:  Recent fall with left hip pain  EXAM: LEFT HIP (WITH PELVIS) 2-3 VIEWS  COMPARISON:  None.  FINDINGS: There is no evidence of hip fracture or  dislocation. There is no evidence of arthropathy or other focal bone abnormality.  IMPRESSION: No acute abnormality noted.   Electronically Signed   By: Alcide Clever M.D.   On: 01/22/2015 14:23    Labs:   Basic Metabolic Panel:  Recent Labs Lab 01/24/15 1445 01/24/15 1700 01/24/15 2049 01/25/15 0516 01/26/15 0516  NA 134*  --   --  137 139  K 3.5  --   --  3.3* 3.2*  CL 98*  --   --  100* 103  CO2 26  --   --  26 27  GLUCOSE  108*  --   --  105* 107*  BUN 26*  --   --  19 6  CREATININE 1.30*  --  0.96 0.94 0.55  CALCIUM 11.0*  --   --  10.5* 9.7  MG  --  2.1  --   --   --   PHOS  --   --  3.2  --   --    GFR Estimated Creatinine Clearance: 58.2 mL/min (by C-G formula based on Cr of 0.55).  CBC:  Recent Labs Lab 01/24/15 1445 01/24/15 2049 01/25/15 0516  WBC 9.5 8.3 6.7  NEUTROABS 7.2  --   --   HGB 12.0 13.1 12.5  HCT 35.9* 38.2 38.4  MCV 93.7 92.9 95.3  PLT 215 215 254   Microbiology Recent Results (from the past 240 hour(s))  Urine culture     Status: None   Collection Time: 01/24/15  2:38 PM  Result Value Ref Range Status   Specimen Description URINE, CATHETERIZED  Final   Special Requests NONE  Final   Culture   Final    >=100,000 COLONIES/mL STREPTOCOCCUS GROUP D;high probability for S.bovis Performed at [facility]    Report Status 01/27/2015 FINAL  Final   Organism ID, Bacteria STREPTOCOCCUS GROUP D;high probability for S.bovis  Final      Susceptibility   Streptococcus group d;high probability for s.bovis - MIC*    LEVOFLOXACIN >=16 RESISTANT Resistant     VANCOMYCIN 0.5 SENSITIVE Sensitive     * >=100,000 COLONIES/mL STREPTOCOCCUS GROUP D;high probability for S.bovis     Discharge Instructions:       Discharge Instructions    Call MD for:  extreme fatigue    Complete by:  As directed      Call MD for:  persistant nausea and vomiting    Complete by:  As directed      Call MD for:  severe uncontrolled pain    Complete by:  As directed      Call MD for:  temperature >100.4    Complete by:  As directed      Diet - low sodium heart healthy    Complete by:  As directed      Increase activity slowly    Complete by:  As directed      Walk with assistance    Complete by:  As directed      Walker     Complete by:  As directed             Medication List    STOP taking these medications        gabapentin 600 MG tablet  Commonly known as:  NEURONTIN      HYDROcodone-acetaminophen 7.5-325 MG per tablet  Commonly known as:  NORCO      TAKE these medications        acetaminophen 500  MG tablet  Commonly known as:  TYLENOL  Take 500 mg by mouth every 4 (four) hours as needed for fever (fever). For pain     alendronate 70 MG tablet  Commonly known as:  FOSAMAX  Take 70 mg by mouth once a week. Take with a full glass of water on an empty stomach. Takes on Mondays     CALCARB 600/D 600-400 MG-UNIT per tablet  Generic drug:  Calcium Carbonate-Vitamin D  Take 1 tablet by mouth daily.     cefUROXime 500 MG tablet  Commonly known as:  CEFTIN  Take 1 tablet (500 mg total) by mouth 2 (two) times daily with a meal.     clonazePAM 0.5 MG tablet  Commonly known as:  KLONOPIN  Take 1 tablet (0.5 mg total) by mouth 2 (two) times daily.     cycloSPORINE 0.05 % ophthalmic emulsion  Commonly known as:  RESTASIS  Place 1 drop into both eyes 2 (two) times daily.     divalproex 250 MG DR tablet  Commonly known as:  DEPAKOTE  Take 250 mg by mouth 3 (three) times daily.     docusate sodium 100 MG capsule  Commonly known as:  COLACE  Take 100 mg by mouth 2 (two) times daily. For constipation     DULoxetine 60 MG capsule  Commonly known as:  CYMBALTA  Take 60 mg by mouth 2 (two) times daily.     guaiFENesin 100 MG/5ML liquid  Commonly known as:  ROBITUSSIN  Take 200 mg by mouth every 6 (six) hours as needed for cough.     loperamide 2 MG capsule  Commonly known as:  IMODIUM  Take 2 mg by mouth as needed for diarrhea or loose stools.     losartan-hydrochlorothiazide 50-12.5 MG per tablet  Commonly known as:  HYZAAR  Take 1 tablet by mouth daily.     lurasidone 40 MG Tabs tablet  Commonly known as:  LATUDA  Take 40 mg by mouth daily with lunch.     magnesium hydroxide 400 MG/5ML suspension  Commonly known as:  MILK OF MAGNESIA  Take 30 mLs by mouth daily as needed for mild constipation.     Melatonin 3 MG Tabs  Take 3 mg by mouth at  bedtime.     meloxicam 15 MG tablet  Commonly known as:  MOBIC  Take 15 mg by mouth daily.     omeprazole 20 MG capsule  Commonly known as:  PRILOSEC  Take 20 mg by mouth daily after breakfast.     QUEtiapine 25 MG tablet  Commonly known as:  SEROQUEL  Take 25 mg by mouth at bedtime.     sertraline 50 MG tablet  Commonly known as:  ZOLOFT  Take 50 mg by mouth daily.     valACYclovir 500 MG tablet  Commonly known as:  VALTREX  Take 500 mg by mouth daily.       Follow-up Information    [facility] Schedule an appointment as soon as possible for a visit in 1 week.   Specialty:  Internal Medicine   Why:  Hospital follow up       Time coordinating discharge: 35 minutes.  Signed:  Jettson Crable  Pager [phone] [facility] 01/27/2015, 4:19 PM

## 2015-01-27 NOTE — Discharge Instructions (Signed)

## 2015-01-27 NOTE — Progress Notes (Signed)
Progress Note   [name] [name] BMW:[id number] DOB: 1936/01/01 DOA: 01/24/2015 PCP: [name], MD   Brief Narrative:   [name] [name] is an 79 y.o. female with a PMH of dementia and depression/anxiety with a chronic tremor who was admitted 01/24/15 with acute encephalopathy in the setting of a urinary tract infection.  Assessment/Plan:   Principal Problem:   Metabolic encephalopathy likely secondary to UTI in the setting of dementia with underlying psychiatric illness - Confusion resolved. - Suspect secondary to strep bovis UTI. Continue empiric antibiotics pending susceptibility data. - No need for IE workup, discussed with ID.  Active Problems:   Dementia with behavioral disturbance  - Continue Depakote, Latuda, Cymbalta, Seroquel and Zoloft.    Dehydration - Continue IV fluids.    Hyponatremia - Resolved.    Hypokalemia - Replete.     DVT Prophylaxis - Lovenox ordered.  Family Communication: No family at the bedside.  Spoke with daughter at length last pm, and again today. Disposition Plan: From an assisted living facility, can d/c to SNF when bed available. Code Status:     Code Status Orders        Start     Ordered   01/24/15 1927  Full code   Continuous     01/24/15 1926        IV Access:    Peripheral IV   Procedures and diagnostic studies:   Dg Chest 2 View  01/24/2015   CLINICAL DATA:  Productive cough and fatigue  EXAM: CHEST  2 VIEW  COMPARISON:  August 24, 2009  FINDINGS: There is mild atelectasis in the left base. Lungs elsewhere clear. Heart size and pulmonary vascularity are normal. No adenopathy. No bone lesions.  IMPRESSION: Mild atelectasis left base.  Lungs elsewhere clear.   Electronically Signed   By: Bretta Bang III M.D.   On: 01/24/2015 15:50     Medical Consultants:    None.  Anti-Infectives:    Rocephin 01/24/15--->  Subjective:   [name] [name] is calm, without nausea, vomiting, diarrhea.  Reports  some abdominal pain.  No dyspnea or cough.  Objective:    Filed Vitals:   01/26/15 0500 01/26/15 1304 01/26/15 2028 01/27/15 0528  BP: 121/65 138/65 141/64 145/72  Pulse: 73 78 80 87  Temp: 97.6 F (36.4 C) 98.4 F (36.9 C) 98.6 F (37 C) 98 F (36.7 C)  TempSrc: Oral Oral Oral Oral  Resp: Height:      Weight:      SpO2: 97% 98% 98% 99%    Intake/Output Summary (Last 24 hours) at 01/27/15 0800 Last data filed at 01/27/15 0600  Gross per 24 hour  Intake   2770 ml  Output    250 ml  Net   2520 ml    Exam: Gen:  NAD Cardiovascular:  RRR, No M/R/G Respiratory:  Lungs CTAB Gastrointestinal:  Abdomen soft, NT/ND, + BS Extremities:  No C/E/C   Data Reviewed:    Labs: Basic Metabolic Panel:  Recent Labs Lab 01/24/15 1445 01/24/15 1700 01/24/15 2049 01/25/15 0516 01/26/15 0516  NA 134*  --   --  137 139  K 3.5  --   --  3.3* 3.2*  CL 98*  --   --  100* 103  CO2 26  --   --  26 27  GLUCOSE 108*  --   --  105* 107*  BUN 26*  --   --  19 6  CREATININE 1.30*  --  0.96 0.94 0.55  CALCIUM 11.0*  --   --  10.5* 9.7  MG  --  2.1  --   --   --   PHOS  --   --  3.2  --   --    GFR Estimated Creatinine Clearance: 58.2 mL/min (by C-G formula based on Cr of 0.55).  CBC:  Recent Labs Lab 01/24/15 1445 01/24/15 2049 01/25/15 0516  WBC 9.5 8.3 6.7  NEUTROABS 7.2  --   --   HGB 12.0 13.1 12.5  HCT 35.9* 38.2 38.4  MCV 93.7 92.9 95.3  PLT 215 215 254   Sepsis Labs:  Recent Labs Lab 01/24/15 1445 01/24/15 1638 01/24/15 2049 01/25/15 0516  WBC 9.5  --  8.3 6.7  LATICACIDVEN  --  0.74  --   --    Microbiology Recent Results (from the past 240 hour(s))  Urine culture     Status: None (Preliminary result)   Collection Time: 01/24/15  2:38 PM  Result Value Ref Range Status   Specimen Description URINE, CATHETERIZED  Final   Special Requests NONE  Final   Culture   Final    >=100,000 COLONIES/mL STREPTOCOCCUS GROUP D;high probability for  S.bovis [facility]    Report Status PENDING  Incomplete     Medications:   . cefTRIAXone (ROCEPHIN)  IV  1 g Intravenous Q24H  . cycloSPORINE  1 drop Both Eyes BID  . divalproex  250 mg Oral TID  . DULoxetine  60 mg Oral BID  . enoxaparin (LOVENOX) injection  40 mg Subcutaneous Q24H  . lurasidone  40 mg Oral Q lunch  . pantoprazole  40 mg Oral Daily  . QUEtiapine  25 mg Oral QHS  . sertraline  50 mg Oral Daily  . valACYclovir  500 mg Oral Daily   Continuous Infusions: . dextrose 5 % and 0.9% NaCl 100 mL/hr at 01/27/15 0330    Time spent: 25 minutes.   LOS: 3 days   RAMA,CHRISTINA  [facility] Pager [phone]. If unable to reach me by pager, please call my cell phone at [phone].  *Please refer to [email or web address], password [email or web address] to get updated schedule on who will round on this patient, as hospitalists switch teams weekly. If 7PM-7AM, please contact night-coverage at [email or web address], password [email or web address] for any overnight needs.  01/27/2015, 8:00 AM

## 2015-02-13 ENCOUNTER — Emergency Department (HOSPITAL_COMMUNITY)
Admission: EM | Admit: 2015-02-13 | Discharge: 2015-02-13 | Disposition: A | Discharge status: Home / Self Care | Payer: Medicare Other | Attending: Emergency Medicine | Admitting: Emergency Medicine

## 2015-02-13 ENCOUNTER — Encounter (HOSPITAL_COMMUNITY): Payer: Self-pay | Admitting: Emergency Medicine

## 2015-02-13 DIAGNOSIS — R079 Chest pain, unspecified: Secondary | ICD-10-CM

## 2015-02-13 DIAGNOSIS — F329 Major depressive disorder, single episode, unspecified: Secondary | ICD-10-CM | POA: Insufficient documentation

## 2015-02-13 DIAGNOSIS — Z79899 Other long term (current) drug therapy: Secondary | ICD-10-CM | POA: Insufficient documentation

## 2015-02-13 DIAGNOSIS — R0789 Other chest pain: Secondary | ICD-10-CM | POA: Insufficient documentation

## 2015-02-13 DIAGNOSIS — Z791 Long term (current) use of non-steroidal anti-inflammatories (NSAID): Secondary | ICD-10-CM | POA: Insufficient documentation

## 2015-02-13 DIAGNOSIS — F419 Anxiety disorder, unspecified: Secondary | ICD-10-CM | POA: Diagnosis not present

## 2015-02-13 DIAGNOSIS — F0390 Unspecified dementia without behavioral disturbance: Secondary | ICD-10-CM | POA: Diagnosis not present

## 2015-02-13 DIAGNOSIS — I10 Essential (primary) hypertension: Secondary | ICD-10-CM | POA: Diagnosis not present

## 2015-02-13 DIAGNOSIS — M1990 Unspecified osteoarthritis, unspecified site: Secondary | ICD-10-CM | POA: Diagnosis not present

## 2015-02-13 DIAGNOSIS — Z720 Tobacco use: Secondary | ICD-10-CM | POA: Diagnosis not present

## 2015-02-13 LAB — I-STAT CHEM 8, ED
BUN: 18 mg/dL (ref 6–20)
CHLORIDE: 95 mmol/L — AB (ref 101–111)
Calcium, Ion: 1.34 mmol/L — ABNORMAL HIGH (ref 1.13–1.30)
Creatinine, Ser: 0.8 mg/dL (ref 0.44–1.00)
Glucose, Bld: 101 mg/dL — ABNORMAL HIGH (ref 65–99)
HEMATOCRIT: 39 % (ref 36.0–46.0)
Hemoglobin: 13.3 g/dL (ref 12.0–15.0)
POTASSIUM: 3.3 mmol/L — AB (ref 3.5–5.1)
Sodium: 133 mmol/L — ABNORMAL LOW (ref 135–145)
TCO2: 24 mmol/L (ref 0–100)

## 2015-02-13 LAB — CBC WITH DIFFERENTIAL/PLATELET
Basophils Absolute: 0 10*3/uL (ref 0.0–0.1)
Basophils Relative: 0 % (ref 0–1)
Eosinophils Absolute: 0.1 10*3/uL (ref 0.0–0.7)
Eosinophils Relative: 1 % (ref 0–5)
HCT: 36.9 % (ref 36.0–46.0)
Hemoglobin: 12.7 g/dL (ref 12.0–15.0)
LYMPHS PCT: 20 % (ref 12–46)
Lymphs Abs: 1.7 10*3/uL (ref 0.7–4.0)
MCH: 31.8 pg (ref 26.0–34.0)
MCHC: 34.4 g/dL (ref 30.0–36.0)
MCV: 92.3 fL (ref 78.0–100.0)
Monocytes Absolute: 0.6 10*3/uL (ref 0.1–1.0)
Monocytes Relative: 7 % (ref 3–12)
Neutro Abs: 6.1 10*3/uL (ref 1.7–7.7)
Neutrophils Relative %: 72 % (ref 43–77)
Platelets: 278 10*3/uL (ref 150–400)
RBC: 4 MIL/uL (ref 3.87–5.11)
RDW: 12.3 % (ref 11.5–15.5)
WBC: 8.4 10*3/uL (ref 4.0–10.5)

## 2015-02-13 LAB — I-STAT TROPONIN, ED
Troponin i, poc: 0 ng/mL (ref 0.00–0.08)
Troponin i, poc: 0 ng/mL (ref 0.00–0.08)

## 2015-02-13 MED ORDER — POTASSIUM CHLORIDE CRYS ER 20 MEQ PO TBCR
40.0000 meq | EXTENDED_RELEASE_TABLET | Freq: Once | ORAL | Status: AC
  Administered 2015-02-13: 40 meq via ORAL
  Filled 2015-02-13: qty 2

## 2015-02-13 NOTE — ED Notes (Signed)
[facility], Pt daughter notified of pt returning

## 2015-02-13 NOTE — ED Notes (Signed)
PTAR CALLED .

## 2015-02-13 NOTE — ED Notes (Addendum)
[facility]. Pt was seen 2 weeks ago for and episode of SVT, was admitted overnight for observation and d/c back to nursing facility without a dx. Pt was at nursing facility today and was very upset because she wanted to see her daughter and her daughter is at work. Pt then started to have CP and nausea. Denies any CP and nausea at this time. Received ASA 324mg , This nurse talked to pt daughter on phone because pt requested to speak with her, daughter stated " she has nothing wrong with her, she threw a hissy fit at the facility because she wanted more pills and it wasn't time for more pills" "She does this every time she does not get her way, when she wants to see the doctor she thinks she should see him right away and when he does not come right away she throws these hissy fits and gets her way by going to the emergency room"

## 2015-02-13 NOTE — Discharge Instructions (Signed)

## 2015-02-13 NOTE — ED Provider Notes (Signed)
CSN: [id number]     Arrival date & time 02/13/15  1251 History   First MD Initiated Contact with Patient 02/13/15 1302     Chief Complaint  Patient presents with  . Chest Pain     (Consider location/radiation/quality/duration/timing/severity/associated sxs/prior Treatment) HPI  Level V caveat dementia history is obtained from patient, from patient's daughter, Dorothey BasemanLinda Murphey via phone and fromKatie Turner, Nature conservation officermedical technician [facility]. Patient complained of chest pain onset 11:30 AM today, anterior, nonradiating when she became a mostly upset because "she did not get her way". Both patient's daughter and Ms. Turner states the patient frequently complains of chest pain when when she gets into an argument or doesn't get her way. Ms. Mayford Knifeurner also reports that she initially complained of chest pain which moved to her ear and then to 1 arm and followed by moving to the other arm. The patient was extremely emotionally upset. She is presently asymptomatic without treatment Past Medical History  Diagnosis Date  . Dementia   . Anxiety   . MDD (major depressive disorder)   . Hypertension   . Head ache   . Tremor   . Osteoarthritis    Past Surgical History  Procedure Laterality Date  . Abdominal hysterectomy    . Hemorroidectomy    . Pterygium excision    . Foot surgery      right   No family history on file. History  Substance Use Topics  . Smoking status: Current Every Day Smoker    Start date: 01/24/2015  . Smokeless tobacco: Not on file  . Alcohol Use: No   OB History    No data available     Review of Systems  Unable to perform ROS: Dementia      Allergies  Review of patient's allergies indicates no known allergies.  Home Medications   Prior to Admission medications   Medication Sig Start Date End Date Taking? Authorizing Provider  acetaminophen (TYLENOL) 500 MG tablet Take 500 mg by mouth every 4 (four) hours as needed for fever  (fever). For pain    Historical Provider, MD  alendronate (FOSAMAX) 70 MG tablet Take 70 mg by mouth once a week. Take with a full glass of water on an empty stomach. Takes on Mondays    Historical Provider, MD  Calcium Carbonate-Vitamin D (CALCARB 600/D) 600-400 MG-UNIT per tablet Take 1 tablet by mouth daily.    Historical Provider, MD  cefUROXime (CEFTIN) 500 MG tablet Take 1 tablet (500 mg total) by mouth 2 (two) times daily with a meal. 01/27/15   Christina P Rama, MD  clonazePAM (KLONOPIN) 0.5 MG tablet Take 1 tablet (0.5 mg total) by mouth 2 (two) times daily. 01/27/15   Maryruth Bunhristina P Rama, MD  cycloSPORINE (RESTASIS) 0.05 % ophthalmic emulsion Place 1 drop into both eyes 2 (two) times daily.    Historical Provider, MD  divalproex (DEPAKOTE) 250 MG DR tablet Take 250 mg by mouth 3 (three) times daily.     Historical Provider, MD  docusate sodium (COLACE) 100 MG capsule Take 100 mg by mouth 2 (two) times daily. For constipation    Historical Provider, MD  DULoxetine (CYMBALTA) 60 MG capsule Take 60 mg by mouth 2 (two) times daily.    Historical Provider, MD  guaiFENesin (ROBITUSSIN) 100 MG/5ML liquid Take 200 mg by mouth every 6 (six) hours as needed for cough.    Historical Provider, MD  loperamide (IMODIUM) 2 MG capsule Take 2 mg by  mouth as needed for diarrhea or loose stools.    Historical Provider, MD  losartan-hydrochlorothiazide (HYZAAR) 50-12.5 MG per tablet Take 1 tablet by mouth daily.    Historical Provider, MD  lurasidone (LATUDA) 40 MG TABS tablet Take 40 mg by mouth daily with lunch.     Historical Provider, MD  magnesium hydroxide (MILK OF MAGNESIA) 400 MG/5ML suspension Take 30 mLs by mouth daily as needed for mild constipation.    Historical Provider, MD  Melatonin 3 MG TABS Take 3 mg by mouth at bedtime.    Historical Provider, MD  meloxicam (MOBIC) 15 MG tablet Take 15 mg by mouth daily.    Historical Provider, MD  omeprazole (PRILOSEC) 20 MG capsule Take 20 mg by mouth daily  after breakfast.    Historical Provider, MD  QUEtiapine (SEROQUEL) 25 MG tablet Take 25 mg by mouth at bedtime.    Historical Provider, MD  sertraline (ZOLOFT) 50 MG tablet Take 50 mg by mouth daily.    Historical Provider, MD  valACYclovir (VALTREX) 500 MG tablet Take 500 mg by mouth daily.    Historical Provider, MD   BP 115/55 mmHg  Pulse 84  Temp(Src) 98.2 F (36.8 C) (Oral)  Resp 18  SpO2 92% Physical Exam  Constitutional: She appears well-developed and well-nourished.  HENT:  Head: Normocephalic and atraumatic.  Eyes: Conjunctivae are normal. Pupils are equal, round, and reactive to light.  Neck: Neck supple. No tracheal deviation present. No thyromegaly present.  Cardiovascular: Normal rate and regular rhythm.   No murmur heard. Pulmonary/Chest: Effort normal and breath sounds normal.  Abdominal: Soft. Bowel sounds are normal. She exhibits no distension. There is no tenderness.  Musculoskeletal: Normal range of motion. She exhibits no edema or tenderness.  Neurological: She is alert. Coordination normal.  Skin: Skin is warm and dry. No rash noted.  Psychiatric: She has a normal mood and affect.  Nursing note and vitals reviewed.   ED Course  Procedures (including critical care time) Labs Review Labs Reviewed - No data to display  Imaging Review No results found.   EKG Interpretation   Date/Time:  Tuesday February 13 2015 12:55:34 EDT Ventricular Rate:  83 PR Interval:    QRS Duration: 96 QT Interval:  401 QTC Calculation: 471 R Axis:   -17 Text Interpretation:  Atrial flutter with predominant 4:1 AV block  Ventricular premature complex Borderline left axis deviation Since last  tracing rate slower Premature ventricular complexes no significant change  from 08/24/09 Confirmed by Ethelda Chick  MD, Garrit Marrow [phone]) on 02/13/2015 2:13:36  PM      MDM  No signs of acute coronary syndrome. Negative troponins. Nonacute EKG. Plan return to assisted-living facility Final  diagnoses:  None   diagnosis #1 nonspecific chest pain #2 hypokalemia      Doug Sou, MD 02/13/15 1702

## 2015-03-16 ENCOUNTER — Encounter (HOSPITAL_COMMUNITY): Payer: Self-pay | Admitting: *Deleted

## 2015-03-16 ENCOUNTER — Emergency Department (HOSPITAL_COMMUNITY)
Admission: EM | Admit: 2015-03-16 | Discharge: 2015-03-18 | Payer: Medicare Other | Attending: Emergency Medicine | Admitting: Emergency Medicine

## 2015-03-16 ENCOUNTER — Emergency Department (HOSPITAL_COMMUNITY): Payer: Medicare Other

## 2015-03-16 DIAGNOSIS — Z79899 Other long term (current) drug therapy: Secondary | ICD-10-CM | POA: Diagnosis not present

## 2015-03-16 DIAGNOSIS — M1990 Unspecified osteoarthritis, unspecified site: Secondary | ICD-10-CM | POA: Diagnosis not present

## 2015-03-16 DIAGNOSIS — F29 Unspecified psychosis not due to a substance or known physiological condition: Secondary | ICD-10-CM | POA: Diagnosis not present

## 2015-03-16 DIAGNOSIS — Z791 Long term (current) use of non-steroidal anti-inflammatories (NSAID): Secondary | ICD-10-CM | POA: Diagnosis not present

## 2015-03-16 DIAGNOSIS — R410 Disorientation, unspecified: Secondary | ICD-10-CM | POA: Diagnosis not present

## 2015-03-16 DIAGNOSIS — R440 Auditory hallucinations: Secondary | ICD-10-CM | POA: Insufficient documentation

## 2015-03-16 DIAGNOSIS — N390 Urinary tract infection, site not specified: Secondary | ICD-10-CM | POA: Insufficient documentation

## 2015-03-16 DIAGNOSIS — F22 Delusional disorders: Secondary | ICD-10-CM | POA: Diagnosis not present

## 2015-03-16 DIAGNOSIS — I10 Essential (primary) hypertension: Secondary | ICD-10-CM | POA: Diagnosis not present

## 2015-03-16 DIAGNOSIS — R443 Hallucinations, unspecified: Secondary | ICD-10-CM

## 2015-03-16 DIAGNOSIS — Z720 Tobacco use: Secondary | ICD-10-CM | POA: Diagnosis not present

## 2015-03-16 LAB — URINE MICROSCOPIC-ADD ON

## 2015-03-16 LAB — CBC WITH DIFFERENTIAL/PLATELET
BASOS ABS: 0 10*3/uL (ref 0.0–0.1)
BASOS PCT: 0 % (ref 0–1)
EOS ABS: 0 10*3/uL (ref 0.0–0.7)
EOS PCT: 1 % (ref 0–5)
HCT: 36.6 % (ref 36.0–46.0)
Hemoglobin: 12.3 g/dL (ref 12.0–15.0)
Lymphocytes Relative: 23 % (ref 12–46)
Lymphs Abs: 1.4 10*3/uL (ref 0.7–4.0)
MCH: 31.5 pg (ref 26.0–34.0)
MCHC: 33.6 g/dL (ref 30.0–36.0)
MCV: 93.6 fL (ref 78.0–100.0)
Monocytes Absolute: 0.5 10*3/uL (ref 0.1–1.0)
Monocytes Relative: 8 % (ref 3–12)
NEUTROS PCT: 68 % (ref 43–77)
Neutro Abs: 4.2 10*3/uL (ref 1.7–7.7)
PLATELETS: 231 10*3/uL (ref 150–400)
RBC: 3.91 MIL/uL (ref 3.87–5.11)
RDW: 12.7 % (ref 11.5–15.5)
WBC: 6 10*3/uL (ref 4.0–10.5)

## 2015-03-16 LAB — URINALYSIS, ROUTINE W REFLEX MICROSCOPIC
Bilirubin Urine: NEGATIVE
Glucose, UA: NEGATIVE mg/dL
KETONES UR: 15 mg/dL — AB
Nitrite: NEGATIVE
PROTEIN: NEGATIVE mg/dL
Specific Gravity, Urine: 1.02 (ref 1.005–1.030)
UROBILINOGEN UA: 1 mg/dL (ref 0.0–1.0)
pH: 6 (ref 5.0–8.0)

## 2015-03-16 LAB — COMPREHENSIVE METABOLIC PANEL
ALK PHOS: 33 U/L — AB (ref 38–126)
ALT: 11 U/L — ABNORMAL LOW (ref 14–54)
AST: 15 U/L (ref 15–41)
Albumin: 4 g/dL (ref 3.5–5.0)
Anion gap: 9 (ref 5–15)
BUN: 26 mg/dL — ABNORMAL HIGH (ref 6–20)
CALCIUM: 10.5 mg/dL — AB (ref 8.9–10.3)
CHLORIDE: 100 mmol/L — AB (ref 101–111)
CO2: 27 mmol/L (ref 22–32)
CREATININE: 0.67 mg/dL (ref 0.44–1.00)
GFR calc non Af Amer: >60 mL/min (ref >60)
Glucose, Bld: 109 mg/dL — ABNORMAL HIGH (ref 65–99)
Potassium: 3 mmol/L — ABNORMAL LOW (ref 3.5–5.1)
Sodium: 136 mmol/L (ref 135–145)
TOTAL PROTEIN: 6.6 g/dL (ref 6.5–8.1)
Total Bilirubin: 0.7 mg/dL (ref 0.3–1.2)

## 2015-03-16 MED ORDER — DIVALPROEX SODIUM 250 MG PO DR TAB
250.0000 mg | DELAYED_RELEASE_TABLET | ORAL | Status: DC
  Administered 2015-03-16 – 2015-03-18 (×2): 250 mg via ORAL
  Filled 2015-03-16 (×2): qty 1

## 2015-03-16 MED ORDER — CEPHALEXIN 500 MG PO CAPS
500.0000 mg | ORAL_CAPSULE | Freq: Four times a day (QID) | ORAL | Status: DC
  Administered 2015-03-17 – 2015-03-18 (×6): 500 mg via ORAL
  Filled 2015-03-16 (×7): qty 1

## 2015-03-16 MED ORDER — HYDROCHLOROTHIAZIDE 12.5 MG PO CAPS
12.5000 mg | ORAL_CAPSULE | Freq: Every day | ORAL | Status: DC
  Administered 2015-03-16 – 2015-03-18 (×3): 12.5 mg via ORAL
  Filled 2015-03-16 (×3): qty 1

## 2015-03-16 MED ORDER — DOCUSATE SODIUM 100 MG PO CAPS
100.0000 mg | ORAL_CAPSULE | Freq: Two times a day (BID) | ORAL | Status: DC
  Administered 2015-03-16 – 2015-03-18 (×5): 100 mg via ORAL
  Filled 2015-03-16 (×5): qty 1

## 2015-03-16 MED ORDER — LOSARTAN POTASSIUM-HCTZ 50-12.5 MG PO TABS: 1.0000 | ORAL_TABLET | Freq: Every day | ORAL | Status: DC

## 2015-03-16 MED ORDER — DEXTROSE 5 % IV SOLN
1.0000 g | Freq: Once | INTRAVENOUS | Status: AC
  Administered 2015-03-16: 1 g via INTRAVENOUS
  Filled 2015-03-16: qty 10

## 2015-03-16 MED ORDER — MELOXICAM 15 MG PO TABS
15.0000 mg | ORAL_TABLET | Freq: Every day | ORAL | Status: DC
  Administered 2015-03-16 – 2015-03-18 (×3): 15 mg via ORAL
  Filled 2015-03-16 (×3): qty 1

## 2015-03-16 MED ORDER — ACETAMINOPHEN 500 MG PO TABS: 500.0000 mg | ORAL_TABLET | ORAL | Status: DC | PRN

## 2015-03-16 MED ORDER — CLONAZEPAM 0.5 MG PO TABS
0.5000 mg | ORAL_TABLET | Freq: Two times a day (BID) | ORAL | Status: DC
  Administered 2015-03-16 – 2015-03-18 (×5): 0.5 mg via ORAL
  Filled 2015-03-16 (×5): qty 1

## 2015-03-16 MED ORDER — QUETIAPINE FUMARATE 25 MG PO TABS
25.0000 mg | ORAL_TABLET | Freq: Every day | ORAL | Status: DC
  Administered 2015-03-16 – 2015-03-17 (×2): 25 mg via ORAL
  Filled 2015-03-16 (×2): qty 1

## 2015-03-16 MED ORDER — LURASIDONE HCL 40 MG PO TABS
40.0000 mg | ORAL_TABLET | Freq: Every day | ORAL | Status: DC
  Administered 2015-03-17 – 2015-03-18 (×2): 40 mg via ORAL
  Filled 2015-03-16 (×2): qty 1

## 2015-03-16 MED ORDER — DULOXETINE HCL 60 MG PO CPEP
60.0000 mg | ORAL_CAPSULE | Freq: Two times a day (BID) | ORAL | Status: DC
  Administered 2015-03-16 – 2015-03-18 (×4): 60 mg via ORAL
  Filled 2015-03-16 (×5): qty 1

## 2015-03-16 MED ORDER — PANTOPRAZOLE SODIUM 40 MG PO TBEC
40.0000 mg | DELAYED_RELEASE_TABLET | Freq: Every day | ORAL | Status: DC
  Administered 2015-03-16 – 2015-03-18 (×3): 40 mg via ORAL
  Filled 2015-03-16 (×3): qty 1

## 2015-03-16 MED ORDER — POTASSIUM CHLORIDE CRYS ER 20 MEQ PO TBCR
40.0000 meq | EXTENDED_RELEASE_TABLET | Freq: Once | ORAL | Status: AC
  Administered 2015-03-16: 40 meq via ORAL
  Filled 2015-03-16: qty 2

## 2015-03-16 MED ORDER — LOSARTAN POTASSIUM 50 MG PO TABS
50.0000 mg | ORAL_TABLET | Freq: Every day | ORAL | Status: DC
  Administered 2015-03-16 – 2015-03-18 (×3): 50 mg via ORAL
  Filled 2015-03-16 (×3): qty 1

## 2015-03-16 MED ORDER — CYCLOSPORINE 0.05 % OP EMUL
1.0000 [drp] | Freq: Two times a day (BID) | OPHTHALMIC | Status: DC
  Administered 2015-03-16 – 2015-03-17 (×3): 1 [drp] via OPHTHALMIC
  Administered 2015-03-18: 10:00:00 via OPHTHALMIC
  Filled 2015-03-16 (×5): qty 1

## 2015-03-16 NOTE — ED Notes (Signed)
Pt unable to tolerate additional stick, did not get labs

## 2015-03-16 NOTE — BH Assessment (Signed)
Assessment Note   [name] [name] is an 79 y.o. female who was brought to the emergency department by ambulance after complaints of visual hallucinations. Pt was somewhat confused during assessment and states that she is afraid to be here and wants to go home. She states that she has been seeing things and pointed to her arm and said "look at this". She states that she is hearing voices telling her that she needs to "get out of here". She states that she is feeling overwhelmed and keeps apologizing for crying. She states "I don't want them to see me cry". Pt was resistant to answering questions and states "can we make this quick". She denies, SI, HI or substance use. She does endorse a history of depression and anxiety but has not been admitted inpatient in the past. She has a daughter and son who are her main sources of support.   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 Dementia   . Anxiety   . MDD (major depressive disorder)   . Hypertension   . Head ache   . Tremor   . Osteoarthritis    Axis IV: other psychosocial or environmental problems Axis V: 31-40 impairment in reality testing  Past Medical History:  Past Medical History  Diagnosis Date  . Dementia   . Anxiety   . MDD (major depressive disorder)   . Hypertension   . Head ache   . Tremor   . Osteoarthritis     Past Surgical History  Procedure Laterality Date  . Abdominal hysterectomy    . Hemorroidectomy    . Pterygium excision    . Foot surgery      right    Family History: History reviewed. No pertinent family history.  Social History:  reports that she has been smoking.  She started smoking about 7 weeks ago. She does not have any smokeless tobacco history on file. She  reports that she does not drink alcohol or use illicit drugs.  Additional Social History:  Alcohol / Drug Use History of alcohol / drug use?: No history of alcohol / drug abuse  CIWA: CIWA-Ar BP: 116/56 mmHg Pulse Rate: 80 COWS:    PATIENT STRENGTHS: (choose at least two) Communication skills Supportive family/friends  Allergies: No Known Allergies  Home Medications:  (Not in a hospital admission)  OB/GYN Status:  No LMP recorded. Patient has had a hysterectomy.  General Assessment Data Location of Assessment: [facility] ED TTS Assessment: In system Is this a Tele or Face-to-Face Assessment?: Face-to-Face Is this an Initial Assessment or a Re-assessment for this encounter?: Initial Assessment Marital status: Single Is patient pregnant?: No Pregnancy Status: No Living Arrangements: Other (Comment) [facility]) Can pt return to current living arrangement?: Yes Admission Status: Voluntary Is patient capable of signing voluntary admission?: Yes Referral Source: Other (nursing home ) Insurance type: Medicare      Crisis Care Plan Living Arrangements: Other (Comment) [facility]) Name of Psychiatrist: none Name of Therapist: none  Education Status Is patient currently in school?: No  Risk to self with the past 6 months Suicidal Ideation: No Has patient been a risk to self within the past 6 months prior to admission? : No Suicidal Intent: No Has patient had any suicidal intent within the past  6 months prior to admission? : No Is patient at risk for suicide?: No Suicidal Plan?: No Has patient had any suicidal plan within the past 6 months prior to admission? : No Access to Means: No What has been your use of drugs/alcohol within the last 12 months?: no Previous Attempts/Gestures: No How many times?: 0 Other Self Harm Risks: Confused, hallucinating Triggers for Past Attempts: None known Intentional Self Injurious Behavior: None Family Suicide History:  Unknown Persecutory voices/beliefs?: Yes Depression:  (Hx of depression) Substance abuse history and/or treatment for substance abuse?: No Suicide prevention information given to non-admitted patients: Not applicable  Risk to Others within the past 6 months Homicidal Ideation: No Does patient have any lifetime risk of violence toward others beyond the six months prior to admission? : No Thoughts of Harm to Others: No Current Homicidal Intent: No Current Homicidal Plan: No Access to Homicidal Means: No Identified Victim: none History of harm to others?: No Assessment of Violence: None Noted Violent Behavior Description: no Does patient have access to weapons?: No Criminal Charges Pending?: No Does patient have a court date: No Is patient on probation?: No  Psychosis Hallucinations: Auditory, Visual Delusions: Unspecified  Mental Status Report Appearance/Hygiene: Disheveled Eye Contact: Fair Motor Activity: Freedom of movement Speech: Incoherent Level of Consciousness: Other (Comment) (confused) Mood: Suspicious Affect: Frightened Anxiety Level: Severe Thought Processes: Flight of Ideas Judgement: Impaired Orientation: Person, Place Obsessive Compulsive Thoughts/Behaviors: Moderate  Cognitive Functioning Concentration: Decreased Memory: Recent Impaired, Remote Impaired IQ: Average Insight: Poor Impulse Control: Fair Appetite: Fair Weight Loss: 0 Weight Gain: 0 Sleep: Decreased Total Hours of Sleep: 4 Vegetative Symptoms: Staying in bed  ADLScreening [facility] Assessment Services) Patient's cognitive ability adequate to safely complete daily activities?: Yes Patient able to express need for assistance with ADLs?: Yes Independently performs ADLs?: Yes (appropriate for developmental age)  Prior Inpatient Therapy Prior Inpatient Therapy: No  Prior Outpatient Therapy Prior Outpatient Therapy: No Does patient have an ACCT team?: No Does patient have Intensive  In-House Services?  : No Does patient have [facility] services? : No Does patient have P4CC services?: No  ADL Screening (condition at time of admission) Patient's cognitive ability adequate to safely complete daily activities?: Yes Is the patient deaf or have difficulty hearing?: No Does the patient have difficulty seeing, even when wearing glasses/contacts?: No Does the patient have difficulty concentrating, remembering, or making decisions?: Yes Patient able to express need for assistance with ADLs?: Yes Does the patient have difficulty dressing or bathing?: Yes Independently performs ADLs?: Yes (appropriate for developmental age) Does the patient have difficulty walking or climbing stairs?: Yes Weakness of Legs: Both Weakness of Arms/Hands: None  Home Assistive Devices/Equipment Home Assistive Devices/Equipment: None    Abuse/Neglect Assessment (Assessment to be complete while patient is alone) Physical Abuse: Denies Verbal Abuse: Denies Sexual Abuse: Denies Exploitation of patient/patient's resources: Denies Self-Neglect: Denies Values / Beliefs Cultural Requests During Hospitalization: None Spiritual Requests During Hospitalization: None Consults Spiritual Care Consult Needed: No Social Work Consult Needed: No Merchant navy officer (For Healthcare) Does patient have an advance directive?: Yes Type of Advance Directive: Midwife (daughter) Does patient want to make changes to advanced directive?: No - Patient declined Copy of advanced directive(s) in chart?: No - copy requested    Additional Information 1:1 In Past 12 Months?: No CIRT Risk: No Elopement Risk: No Does patient have medical clearance?: No     Disposition:  Disposition Initial Assessment Completed for this Encounter: Yes Disposition of Patient:  Other dispositions  Daison Braxton 03/16/2015 2:17 PM

## 2015-03-16 NOTE — ED Notes (Signed)
Bed: WA27 Expected date:  Expected time:  Means of arrival:  Comments: RM 10

## 2015-03-16 NOTE — ED Notes (Signed)
Bed: ZO10 Expected date:  Expected time:  Means of arrival:  Comments: Ems [facility] nursing home ams

## 2015-03-16 NOTE — ED Provider Notes (Signed)
CSN: [id number]     Arrival date & time 03/16/15  1008 History   First MD Initiated Contact with Patient 03/16/15 1033     Chief Complaint  Patient presents with  . Hallucinations      The history is provided by the patient, the EMS personnel and the nursing home. The history is limited by the condition of the patient (Hx dementia).  Pt was seen at 1035. Per EMS, NH report and pt: NH states pt has hx of dementia, depression/anxiety, and hallucinations. State "hallucinations are getting worse" and pt is "seeing things crawling all over the place." Facility sent pt to the ED for "increased risk of injury to herself and others," and she "has been throwing herself on the ground." Pt currently denies any complaints.     Past Medical History  Diagnosis Date  . Dementia   . Anxiety   . MDD (major depressive disorder)   . Hypertension   . Head ache   . Tremor   . Osteoarthritis    Past Surgical History  Procedure Laterality Date  . Abdominal hysterectomy    . Hemorroidectomy    . Pterygium excision    . Foot surgery      right    Social History  Substance Use Topics  . Smoking status: Current Every Day Smoker    Start date: 01/24/2015  . Smokeless tobacco: None  . Alcohol Use: No    Review of Systems  Unable to perform ROS: Dementia      Allergies  Review of patient's allergies indicates no known allergies.  Home Medications   Prior to Admission medications   Medication Sig Start Date End Date Taking? Authorizing Provider  acetaminophen (TYLENOL) 500 MG tablet Take 500 mg by mouth every 4 (four) hours as needed for fever (fever). For pain   Yes Historical Provider, MD  alendronate (FOSAMAX) 70 MG tablet Take 70 mg by mouth once a week. Take with a full glass of water on an empty stomach. Takes on Mondays   Yes Historical Provider, MD  clonazePAM (KLONOPIN) 0.5 MG tablet Take 1 tablet (0.5 mg total) by mouth 2 (two) times daily. 01/27/15  Yes Christina P Rama, MD   cycloSPORINE (RESTASIS) 0.05 % ophthalmic emulsion Place 1 drop into both eyes 2 (two) times daily.   Yes Historical Provider, MD  divalproex (DEPAKOTE) 250 MG DR tablet Take 250 mg by mouth every other day.    Yes Historical Provider, MD  docusate sodium (COLACE) 100 MG capsule Take 100 mg by mouth 2 (two) times daily. For constipation   Yes Historical Provider, MD  DULoxetine (CYMBALTA) 60 MG capsule Take 60 mg by mouth 2 (two) times daily.   Yes Historical Provider, MD  guaiFENesin (ROBITUSSIN) 100 MG/5ML liquid Take 200 mg by mouth every 6 (six) hours as needed for cough.   Yes Historical Provider, MD  loperamide (IMODIUM) 2 MG capsule Take 2 mg by mouth as needed for diarrhea or loose stools.   Yes Historical Provider, MD  losartan-hydrochlorothiazide (HYZAAR) 50-12.5 MG per tablet Take 1 tablet by mouth daily.   Yes Historical Provider, MD  lurasidone (LATUDA) 40 MG TABS tablet Take 40 mg by mouth daily with lunch.    Yes Historical Provider, MD  magnesium hydroxide (MILK OF MAGNESIA) 400 MG/5ML suspension Take 30 mLs by mouth daily as needed for mild constipation.   Yes Historical Provider, MD  meloxicam (MOBIC) 15 MG tablet Take 15 mg by mouth daily.  Yes Historical Provider, MD  omeprazole (PRILOSEC) 20 MG capsule Take 20 mg by mouth daily after breakfast.   Yes Historical Provider, MD  QUEtiapine (SEROQUEL) 25 MG tablet Take 25 mg by mouth at bedtime.   Yes Historical Provider, MD  cefUROXime (CEFTIN) 500 MG tablet Take 1 tablet (500 mg total) by mouth 2 (two) times daily with a meal. Patient not taking: Reported on 03/16/2015 01/27/15   Maryruth Bun Rama, MD   BP 116/56 mmHg  Pulse 80  Temp(Src) 98 F (36.7 C) (Oral)  SpO2 98% Physical Exam  1040: Physical examination:  Nursing notes reviewed; Vital signs and O2 SAT reviewed;  Constitutional: Well developed, Well nourished, Well hydrated, In no acute distress; Head:  Normocephalic, atraumatic; Eyes: EOMI, PERRL, No scleral icterus;  ENMT: Mouth and pharynx normal, Mucous membranes moist; Neck: Supple, Full range of motion, No lymphadenopathy; Cardiovascular: Regular rate and rhythm, No gallop; Respiratory: Breath sounds clear & equal bilaterally, No wheezes.  Speaking full sentences with ease, Normal respiratory effort/excursion; Chest: Nontender, Movement normal; Abdomen: Soft, Nontender, Nondistended, Normal bowel sounds; Genitourinary: No CVA tenderness; Extremities: Pulses normal, No tenderness, No edema, No calf edema or asymmetry.; Neuro: Awake, alert, confused per hx dementia. No facial droop. Major CN grossly intact.  Speech clear. Grips equal. Moves all extremities spontaneously and to command without apparent gross focal motor deficits, +chronic tremor.; Skin: Color normal, Warm, Dry.   ED Course  Procedures   Labs Review  Imaging Review  I have personally reviewed and evaluated these images and lab results as part of my medical decision-making.    MDM  MDM Reviewed: previous chart, nursing note and vitals Reviewed previous: labs Interpretation: labs, x-ray and CT scan      Results for orders placed or performed during the hospital encounter of 03/16/15  Comprehensive metabolic panel  Result Value Ref Range   Sodium 136 135 - 145 mmol/L   Potassium 3.0 (L) 3.5 - 5.1 mmol/L   Chloride 100 (L) 101 - 111 mmol/L   CO2 27 22 - 32 mmol/L   Glucose, Bld 109 (H) 65 - 99 mg/dL   BUN 26 (H) 6 - 20 mg/dL   Creatinine, Ser 1.61 0.44 - 1.00 mg/dL   Calcium 09.6 (H) 8.9 - 10.3 mg/dL   Total Protein 6.6 6.5 - 8.1 g/dL   Albumin 4.0 3.5 - 5.0 g/dL   AST 15 15 - 41 U/L   ALT 11 (L) 14 - 54 U/L   Alkaline Phosphatase 33 (L) 38 - 126 U/L   Total Bilirubin 0.7 0.3 - 1.2 mg/dL   GFR calc non Af Amer >60 >60 mL/min   GFR calc Af Amer >60 >60 mL/min   Anion gap 9 5 - 15  CBC with Differential  Result Value Ref Range   WBC 6.0 4.0 - 10.5 K/uL   RBC 3.91 3.87 - 5.11 MIL/uL   Hemoglobin 12.3 12.0 - 15.0 g/dL    HCT 04.5 40.9 - 81.1 %   MCV 93.6 78.0 - 100.0 fL   MCH 31.5 26.0 - 34.0 pg   MCHC 33.6 30.0 - 36.0 g/dL   RDW 91.4 78.2 - 95.6 %   Platelets 231 150 - 400 K/uL   Neutrophils Relative % 68 43 - 77 %   Neutro Abs 4.2 1.7 - 7.7 K/uL   Lymphocytes Relative 23 12 - 46 %   Lymphs Abs 1.4 0.7 - 4.0 K/uL   Monocytes Relative 8 3 - 12 %  Monocytes Absolute 0.5 0.1 - 1.0 K/uL   Eosinophils Relative 1 0 - 5 %   Eosinophils Absolute 0.0 0.0 - 0.7 K/uL   Basophils Relative 0 0 - 1 %   Basophils Absolute 0.0 0.0 - 0.1 K/uL  Urinalysis, Routine w reflex microscopic (not at [facility])  Result Value Ref Range   Color, Urine YELLOW YELLOW   APPearance CLOUDY (A) CLEAR   Specific Gravity, Urine 1.020 1.005 - 1.030   pH 6.0 5.0 - 8.0   Glucose, UA NEGATIVE NEGATIVE mg/dL   Hgb urine dipstick TRACE (A) NEGATIVE   Bilirubin Urine NEGATIVE NEGATIVE   Ketones, ur 15 (A) NEGATIVE mg/dL   Protein, ur NEGATIVE NEGATIVE mg/dL   Urobilinogen, UA 1.0 0.0 - 1.0 mg/dL   Nitrite NEGATIVE NEGATIVE   Leukocytes, UA MODERATE (A) NEGATIVE  Urine microscopic-add on  Result Value Ref Range   Squamous Epithelial / LPF RARE RARE   WBC, UA 3-6 <3 WBC/hpf   Bacteria, UA MANY (A) RARE   Dg Chest 2 View 03/16/2015   CLINICAL DATA:  Mental status changes today.  EXAM: CHEST  2 VIEW  COMPARISON:  01/24/2015.  FINDINGS: The cardiac silhouette, mediastinal and hilar contours are within normal limits and stable. The lungs are clear. Chronic bronchitic changes and upper lobe scarring changes. No pleural effusion. The bony thorax is intact.  IMPRESSION: Chronic lung changes but no acute pulmonary findings.   Electronically Signed   By: Rudie Meyer M.D.   On: 03/16/2015 12:02   Ct Head Wo Contrast 03/16/2015   CLINICAL DATA:  Altered mental status, history of dementia  EXAM: CT HEAD WITHOUT CONTRAST  TECHNIQUE: Contiguous axial images were obtained from the base of the skull through the vertex without intravenous contrast.   COMPARISON:  01/05/2014  FINDINGS: No skull fracture is noted. Paranasal sinuses and mastoid air cells are unremarkable. Mild cerebral atrophy. Mild periventricular white matter decreased attenuation consistent with chronic small vessel ischemic changes. No definite acute cortical infarction. No intracranial hemorrhage, mass effect or midline shift. No mass lesion is noted on this unenhanced scan.  IMPRESSION: No acute intracranial abnormality. Mild cerebral atrophy. Mild periventricular white matter decreased attenuation consistent with chronic small vessel ischemic changes.   Electronically Signed   By: Natasha Mead M.D.   On: 03/16/2015 11:26    1430:  Potassium repleted PO. +UTI, UC pending; will dose IV rocephin, rx keflex x10 days. Pt now calling out into hallway: "I've been magnetized," "I've been hypnotized," and that she was "hearing voices telling her to get out of here." CM consulted: no criteria for medical admit at this time. TTS has evaluated pt: recommends tx UTI, observation in the ED overnight, and they will re-eval tomorrow morning. Holding orders written.   Samuel Jester, DO 03/16/15 1556

## 2015-03-16 NOTE — Progress Notes (Addendum)
79 yr old female from [facility] memory care area stating she is having hallucinations CM consulted by ED secretary after CM consult ordered by EDP CM spoke with EDP who has ordered pending tests but seeking early assistance for pt disposition  SW consult entered in [facility]  Spoke with TTS staff Tom about questionable need for TTS consult Will await results of labs  1231 Cm spoke with EDP Mcmanus after labs returned  1330 Spoke with EDP No admission reason noted with resulting labs Recommend possible TTS consult if EDP feels needed

## 2015-03-16 NOTE — ED Notes (Signed)
From [facility] Memory care, hx dementia, depression, hallucinations. Hallucinations have been getting worse. "Seeing things crawling all over the place."  Facility sent for increased risk of injury to self/others, has been throwing herself on the ground. Broke her watch during an episode.   Alert to herself and knows she has a daughter.

## 2015-03-17 DIAGNOSIS — R410 Disorientation, unspecified: Secondary | ICD-10-CM

## 2015-03-17 DIAGNOSIS — F29 Unspecified psychosis not due to a substance or known physiological condition: Secondary | ICD-10-CM

## 2015-03-17 DIAGNOSIS — F22 Delusional disorders: Secondary | ICD-10-CM | POA: Diagnosis not present

## 2015-03-17 DIAGNOSIS — R440 Auditory hallucinations: Secondary | ICD-10-CM | POA: Diagnosis not present

## 2015-03-17 NOTE — ED Notes (Signed)
Assumed care of patient. Pt alert and oriented to self. Denies any pain or discomfort at this time. No needs expressed.

## 2015-03-17 NOTE — ED Notes (Signed)
Pt in room and contnuosly calling out saying "please help me"  Repeatedly. Staff goes in and ask pt what she she needs and she continues to say "please help me'.

## 2015-03-17 NOTE — ED Notes (Signed)
Pt ate 25% of her breakfast 

## 2015-03-17 NOTE — Consult Note (Addendum)
[place] Psychiatry Consult   Reason for Consult:  Hallucinations Referring Physician:  EDP Patient Identification: [name] [name] MRN:  [id number] Principal Diagnosis: Delirium, psychosis NOS Diagnosis:   Patient Active Problem List   Diagnosis Date Noted  . Polypharmacy [Z79.899] 01/27/2015  . Dementia with behavioral disturbance [F03.91] 01/26/2015  . Hypokalemia [E87.6] 01/26/2015  . Pressure ulcer [L89.90] 01/25/2015  . Dehydration [E86.0] 01/24/2015  . UTI (lower urinary tract infection) [N39.0] 01/24/2015  . Metabolic encephalopathy [A83.41] 01/24/2015  . Hyponatremia [E87.1] 01/24/2015    Total Time spent with patient: 30 minutes  Subjective:   [name] [name] is a 79 y.o. female patient admitted with hallucinations and confusion.    HPI:   Patient was brought into the emergency room by ambulance as she was complaining of visual hallucination, confusion, feeling scared and noticed having crying spells.  Patient was resistant to answer question but also calling staff member to stay with her.  She endorsed that she is feeling things crawling all over the place.  Patient found to have UTI and she was given and about it.  Patient endorse history of depression and anxiety and she's been taking Latuda and Seroquel however unable to provide more information.  Patient has a daughter who is her main source of support.  It is unclear why patient is taking into psychotic medication.  Patient is unable to provide any history.  Her memory is impaired.  Patient lives in a nursing home at memory care unit.  As per chart she is throwing herself on the ground and broke her watch during the episode.  Patient appears incoherent, confused.  HPI Elements:   Location:  Confusion, hallucination, depression and memory impairment. Quality:  Unable to function. Severity:  Severe.  Past Medical History:  Past Medical History  Diagnosis Date  . Dementia   . Anxiety   . MDD (major depressive  disorder)   . Hypertension   . Head ache   . Tremor   . Osteoarthritis     Past Surgical History  Procedure Laterality Date  . Abdominal hysterectomy    . Hemorroidectomy    . Pterygium excision    . Foot surgery      right   Family History: History reviewed. No pertinent family history. Social History:  History  Alcohol Use No     History  Drug Use No    Social History   Social History  . Marital Status: Widowed    Spouse Name: N/A  . Number of Children: N/A  . Years of Education: N/A   Social History Main Topics  . Smoking status: Current Every Day Smoker    Start date: 01/24/2015  . Smokeless tobacco: None  . Alcohol Use: No  . Drug Use: No  . Sexual Activity: Not Asked   Other Topics Concern  . None   Social History Narrative   Additional Social History:    History of alcohol / drug use?: No history of alcohol / drug abuse                     Allergies:   Allergies  Allergen Reactions  . Abilify [Aripiprazole] Other (See Comments)    Caused pt to not be able to walk and was unable to care for herself     Labs:  Results for orders placed or performed during the hospital encounter of 03/16/15 (from the past 48 hour(s))  Comprehensive metabolic panel     Status: Abnormal  Collection Time: 03/16/15 11:00 AM  Result Value Ref Range   Sodium 136 135 - 145 mmol/L   Potassium 3.0 (L) 3.5 - 5.1 mmol/L   Chloride 100 (L) 101 - 111 mmol/L   CO2 27 22 - 32 mmol/L   Glucose, Bld 109 (H) 65 - 99 mg/dL   BUN 26 (H) 6 - 20 mg/dL   Creatinine, Ser 0.67 0.44 - 1.00 mg/dL   Calcium 10.5 (H) 8.9 - 10.3 mg/dL   Total Protein 6.6 6.5 - 8.1 g/dL   Albumin 4.0 3.5 - 5.0 g/dL   AST 15 15 - 41 U/L   ALT 11 (L) 14 - 54 U/L   Alkaline Phosphatase 33 (L) 38 - 126 U/L   Total Bilirubin 0.7 0.3 - 1.2 mg/dL   GFR calc non Af Amer >60 >60 mL/min   GFR calc Af Amer >60 >60 mL/min    Comment: (NOTE) The eGFR has been calculated using the CKD EPI equation. This  calculation has not been validated in all clinical situations. eGFR's persistently <60 mL/min signify possible Chronic Kidney Disease.    Anion gap 9 5 - 15  CBC with Differential     Status: None   Collection Time: 03/16/15 11:00 AM  Result Value Ref Range   WBC 6.0 4.0 - 10.5 K/uL   RBC 3.91 3.87 - 5.11 MIL/uL   Hemoglobin 12.3 12.0 - 15.0 g/dL   HCT 36.6 36.0 - 46.0 %   MCV 93.6 78.0 - 100.0 fL   MCH 31.5 26.0 - 34.0 pg   MCHC 33.6 30.0 - 36.0 g/dL   RDW 12.7 11.5 - 15.5 %   Platelets 231 150 - 400 K/uL   Neutrophils Relative % 68 43 - 77 %   Neutro Abs 4.2 1.7 - 7.7 K/uL   Lymphocytes Relative 23 12 - 46 %   Lymphs Abs 1.4 0.7 - 4.0 K/uL   Monocytes Relative 8 3 - 12 %   Monocytes Absolute 0.5 0.1 - 1.0 K/uL   Eosinophils Relative 1 0 - 5 %   Eosinophils Absolute 0.0 0.0 - 0.7 K/uL   Basophils Relative 0 0 - 1 %   Basophils Absolute 0.0 0.0 - 0.1 K/uL  Urinalysis, Routine w reflex microscopic (not at [facility])     Status: Abnormal   Collection Time: 03/16/15 11:07 AM  Result Value Ref Range   Color, Urine YELLOW YELLOW   APPearance CLOUDY (A) CLEAR   Specific Gravity, Urine 1.020 1.005 - 1.030   pH 6.0 5.0 - 8.0   Glucose, UA NEGATIVE NEGATIVE mg/dL   Hgb urine dipstick TRACE (A) NEGATIVE   Bilirubin Urine NEGATIVE NEGATIVE   Ketones, ur 15 (A) NEGATIVE mg/dL   Protein, ur NEGATIVE NEGATIVE mg/dL   Urobilinogen, UA 1.0 0.0 - 1.0 mg/dL   Nitrite NEGATIVE NEGATIVE   Leukocytes, UA MODERATE (A) NEGATIVE  Urine culture     Status: None (Preliminary result)   Collection Time: 03/16/15 11:07 AM  Result Value Ref Range   Specimen Description URINE, CATHETERIZED    Special Requests NONE    Culture      CULTURE REINCUBATED FOR BETTER GROWTH Performed at [facility]    Report Status PENDING   Urine microscopic-add on     Status: Abnormal   Collection Time: 03/16/15 11:07 AM  Result Value Ref Range   Squamous Epithelial / LPF RARE RARE   WBC, UA 3-6 <3 WBC/hpf    Bacteria, UA MANY (A) RARE  Vitals: Blood pressure 124/60, pulse 87, temperature 97.8 F (36.6 C), temperature source Oral, resp. rate 22, SpO2 96 %.  Risk to Self: Suicidal Ideation: No Suicidal Intent: No Is patient at risk for suicide?: No Suicidal Plan?: No Access to Means: No What has been your use of drugs/alcohol within the last 12 months?: no How many times?: 0 Other Self Harm Risks: Confused, hallucinating Triggers for Past Attempts: None known Intentional Self Injurious Behavior: None Risk to Others: Homicidal Ideation: No Thoughts of Harm to Others: No Current Homicidal Intent: No Current Homicidal Plan: No Access to Homicidal Means: No Identified Victim: none History of harm to others?: No Assessment of Violence: None Noted Violent Behavior Description: no Does patient have access to weapons?: No Criminal Charges Pending?: No Does patient have a court date: No Prior Inpatient Therapy: Prior Inpatient Therapy: No Prior Outpatient Therapy: Prior Outpatient Therapy: No Does patient have an ACCT team?: No Does patient have Intensive In-House Services?  : No Does patient have [facility] services? : No Does patient have P4CC services?: No  Current Facility-Administered Medications  Medication Dose Route Frequency Provider Last Rate Last Dose  . acetaminophen (TYLENOL) tablet 500 mg  500 mg Oral Q4H PRN Francine Graven, DO      . cephALEXin (KEFLEX) capsule 500 mg  500 mg Oral QID Francine Graven, DO   500 mg at 03/17/15 1007  . clonazePAM (KLONOPIN) tablet 0.5 mg  0.5 mg Oral BID Francine Graven, DO   0.5 mg at 03/17/15 1007  . cycloSPORINE (RESTASIS) 0.05 % ophthalmic emulsion 1 drop  1 drop Both Eyes BID Francine Graven, DO   1 drop at 03/17/15 1011  . divalproex (DEPAKOTE) DR tablet 250 mg  250 mg Oral QODAY Francine Graven, DO   250 mg at 03/16/15 1558  . docusate sodium (COLACE) capsule 100 mg  100 mg Oral BID Francine Graven, DO   100 mg at 03/17/15 1211  .  DULoxetine (CYMBALTA) DR capsule 60 mg  60 mg Oral BID Francine Graven, DO   60 mg at 03/17/15 1008  . losartan (COZAAR) tablet 50 mg  50 mg Oral Daily Francine Graven, DO   50 mg at 03/17/15 1008   And  . hydrochlorothiazide (MICROZIDE) capsule 12.5 mg  12.5 mg Oral Daily Francine Graven, DO   12.5 mg at 03/17/15 1211  . lurasidone (LATUDA) tablet 40 mg  40 mg Oral Q lunch Francine Graven, DO   40 mg at 03/17/15 1153  . meloxicam (MOBIC) tablet 15 mg  15 mg Oral Daily Francine Graven, DO   15 mg at 03/17/15 1008  . pantoprazole (PROTONIX) EC tablet 40 mg  40 mg Oral Daily Francine Graven, DO   40 mg at 03/17/15 1007  . QUEtiapine (SEROQUEL) tablet 25 mg  25 mg Oral QHS Francine Graven, DO   25 mg at 03/16/15 2236   Current Outpatient Prescriptions  Medication Sig Dispense Refill  . acetaminophen (TYLENOL) 500 MG tablet Take 500 mg by mouth every 4 (four) hours as needed for fever (fever). For pain    . alendronate (FOSAMAX) 70 MG tablet Take 70 mg by mouth once a week. Take with a full glass of water on an empty stomach. Takes on Mondays    . clonazePAM (KLONOPIN) 0.5 MG tablet Take 1 tablet (0.5 mg total) by mouth 2 (two) times daily. 30 tablet 0  . cycloSPORINE (RESTASIS) 0.05 % ophthalmic emulsion Place 1 drop into both eyes 2 (two) times daily.    Marland Kitchen  divalproex (DEPAKOTE) 250 MG DR tablet Take 250 mg by mouth every other day.     . docusate sodium (COLACE) 100 MG capsule Take 100 mg by mouth 2 (two) times daily. For constipation    . DULoxetine (CYMBALTA) 60 MG capsule Take 60 mg by mouth 2 (two) times daily.    Marland Kitchen guaiFENesin (ROBITUSSIN) 100 MG/5ML liquid Take 200 mg by mouth every 6 (six) hours as needed for cough.    . loperamide (IMODIUM) 2 MG capsule Take 2 mg by mouth as needed for diarrhea or loose stools.    Marland Kitchen losartan-hydrochlorothiazide (HYZAAR) 50-12.5 MG per tablet Take 1 tablet by mouth daily.    Marland Kitchen lurasidone (LATUDA) 40 MG TABS tablet Take 40 mg by mouth daily with  lunch.     . magnesium hydroxide (MILK OF MAGNESIA) 400 MG/5ML suspension Take 30 mLs by mouth daily as needed for mild constipation.    . meloxicam (MOBIC) 15 MG tablet Take 15 mg by mouth daily.    Marland Kitchen omeprazole (PRILOSEC) 20 MG capsule Take 20 mg by mouth daily after breakfast.    . QUEtiapine (SEROQUEL) 25 MG tablet Take 25 mg by mouth at bedtime.    . cefUROXime (CEFTIN) 500 MG tablet Take 1 tablet (500 mg total) by mouth 2 (two) times daily with a meal. (Patient not taking: Reported on 03/16/2015) 10 tablet 0    Musculoskeletal: Strength & Muscle Tone: increased Gait & Station: unsteady Patient leans: Right, Left and Front  Psychiatric Specialty Exam: Physical Exam  ROS  Blood pressure 124/60, pulse 87, temperature 97.8 F (36.6 C), temperature source Oral, resp. rate 22, SpO2 96 %.There is no weight on file to calculate BMI.  General Appearance: Disheveled and Guarded  Eye Contact::  Poor  Speech:  Incoherent at times  Volume:  Decreased  Mood:  Anxious, Depressed and Dysphoric  Affect:  Inappropriate and Labile  Thought Process:  Disorganized  Orientation:  Other:  Poor  Thought Content:  Hallucinations: Auditory Visual, Paranoid Ideation and Rumination  Suicidal Thoughts:  No  Homicidal Thoughts:  No  Memory:  Immediate;   Poor Recent;   Poor Remote;   Poor  Judgement:  Impaired  Insight:  Lacking  Psychomotor Activity:  Increased, Decreased and Restlessness  Concentration:  Poor  Recall:  Poor  Fund of Knowledge:Poor  Language: Poor  Akathisia:  No  Handed:  Right  AIMS (if indicated):     Assets:  Housing  ADL's:  Impaired  Cognition: Impaired,  Moderate  Sleep:      Medical Decision Making: Review of Psycho-Social Stressors (1), Review or order clinical lab tests (1), Decision to obtain old records (1), Review and summation of old records (2), Established Problem, Worsening (2), New Problem, with no additional work-up planned (3), Review of Medication Regimen  & Side Effects (2) and Review of New Medication or Change in Dosage (2)  Treatment Plan Summary: Medication management and Plan Patient will observed overnight.  Patient is taking abx for UTI.  Continue current psycho topic medication however if patient does not improve consider switching to a different the psychotic medication.  Patient will be evaluated tomorrow.  Plan:  Observe in the emergency room. Disposition: As above  Stepheny Canal T. 03/17/2015 2:00 PM

## 2015-03-17 NOTE — Progress Notes (Signed)
Chaplain referred to [name] [name] for spiritual and emotional care. Conversation dealt with her fears and her deep anxiety. She had difficulty expressing her exact thoughts and is afraid that people are using her, trying to chemically take her mind away. Her deepest fear is people that say they care for her, but don't. She wishes to see her daughter and for her daughter to take her home. She reports she does not feel safe anywhere except with her daughter. Twice she asked the chaplain to confirm she was in the hospital and not in a mental facility. She reports being afraid all of the time, in all situations.  Her spiritual pain is centered in her belief that God and Jesus do not like her and have abandoned her to dangerous situations. When she began to consider this, she was open to talking more about her feeling of disconnection with God. Her spiritual pain was lowered by being lead to see her nurse, her sitter and the tech giving her medicine as safe to be with. As the world around her was seen as safe and protective, the less spiritual pain she experienced. This came crashing down when the chaplain left to see another patient, because he went from a protector to another person who says they are a friend but leaves her.   It is important that [name] [name] receive regular visits by spiritual care providers so that, as much as possible, she can see that she is not abandoned alone in the world. It is important that her family visit her in the hospital to provide continuity of safety.   Chaplain follow-up strongly recommended.  Benjie Karvonen. Dorotea Hand, DMin, MDiv Chaplain

## 2015-03-18 DIAGNOSIS — R410 Disorientation, unspecified: Secondary | ICD-10-CM

## 2015-03-18 DIAGNOSIS — R440 Auditory hallucinations: Secondary | ICD-10-CM | POA: Diagnosis not present

## 2015-03-18 DIAGNOSIS — F29 Unspecified psychosis not due to a substance or known physiological condition: Secondary | ICD-10-CM | POA: Diagnosis not present

## 2015-03-18 MED ORDER — LOSARTAN POTASSIUM-HCTZ 50-12.5 MG PO TABS: 1.0000 | ORAL_TABLET | Freq: Every day | ORAL | Status: DC

## 2015-03-18 MED ORDER — CYCLOSPORINE 0.05 % OP EMUL: 1.0000 [drp] | Freq: Two times a day (BID) | OPHTHALMIC | Status: AC

## 2015-03-18 MED ORDER — ALENDRONATE SODIUM 70 MG PO TABS: 70.0000 mg | ORAL_TABLET | ORAL | Status: AC

## 2015-03-18 MED ORDER — CEPHALEXIN 500 MG PO CAPS: 500.0000 mg | ORAL_CAPSULE | Freq: Four times a day (QID) | ORAL | Status: DC

## 2015-03-18 MED ORDER — DIVALPROEX SODIUM 250 MG PO DR TAB: 250.0000 mg | DELAYED_RELEASE_TABLET | ORAL | Status: DC

## 2015-03-18 MED ORDER — CLONAZEPAM 0.5 MG PO TABS: 0.5000 mg | ORAL_TABLET | Freq: Two times a day (BID) | ORAL | Status: DC

## 2015-03-18 MED ORDER — OMEPRAZOLE 20 MG PO CPDR: 20.0000 mg | DELAYED_RELEASE_CAPSULE | Freq: Every day | ORAL | Status: AC

## 2015-03-18 MED ORDER — DULOXETINE HCL 60 MG PO CPEP: 60.0000 mg | ORAL_CAPSULE | Freq: Two times a day (BID) | ORAL | Status: DC

## 2015-03-18 MED ORDER — LORAZEPAM 0.5 MG PO TABS
0.5000 mg | ORAL_TABLET | Freq: Once | ORAL | Status: AC
  Administered 2015-03-18: 0.5 mg via ORAL
  Filled 2015-03-18: qty 1

## 2015-03-18 MED ORDER — LURASIDONE HCL 40 MG PO TABS: 40.0000 mg | ORAL_TABLET | Freq: Every day | ORAL | Status: DC

## 2015-03-18 NOTE — [facility] Suicide Risk Assessment (Signed)
[facility] Discharge Suicide Risk Assessment   Demographic Factors:  Age 79 or older  Total Time spent with patient: 25 minutes  Musculoskeletal: Strength & Muscle Tone: within normal limits Gait & Station: Lying in bed Patient leans: N/A  Psychiatric Specialty Exam:     Blood pressure 134/57, pulse 82, temperature 97.8 F (36.6 C), temperature source Oral, resp. rate 18, SpO2 96 %.There is no weight on file to calculate BMI.  General Appearance: Disheveled and Guarded  Eye Contact:: Poor  Speech: Incoherent at times  Volume: Decreased  Mood: Anxious, Depressed and Dysphoric  Affect: Congruent  Thought Process: WDL with mild tangential thoughts  Orientation: Other: Poor  Thought Content: Hallucinations: Auditory Visual, Paranoid Ideation and Rumination  Suicidal Thoughts: No  Homicidal Thoughts: No  Memory: Immediate; Poor Recent; Poor Remote; Poor  Judgement: Impaired  Insight: Lacking  Psychomotor Activity: Increased, Decreased and Restlessness  Concentration: Poor  Recall: Poor  Fund of Knowledge:Poor  Language: Poor  Akathisia: No  Handed: Right  AIMS (if indicated):    Assets: Housing  ADL's: Impaired  Cognition: Impaired, Moderate       Sleep: WNL        Has this patient used any form of tobacco in the last 30 days? (Cigarettes, Smokeless Tobacco, Cigars, and/or Pipes) Pt did not need; does not apply  Mental Status Per Nursing Assessment::   On Admission:     Current Mental Status by Physician: Alert to self, oriented to situation, intermittent orientation to time. Pt   Loss Factors: Decline in physical health  Historical Factors: Impulsivity  Risk Reduction Factors:   Positive social support and Positive therapeutic relationship  Continued Clinical Symptoms:  Depression:   Impulsivity Medical Diagnoses and Treatments/Surgeries  Cognitive Features That Contribute To Risk:  Closed-mindedness     Suicide Risk:  Minimal: No identifiable suicidal ideation.  Patients presenting with no risk factors but with morbid ruminations; may be classified as minimal risk based on the severity of the depressive symptoms  Principal Problem: Delirium Discharge Diagnoses:  Patient Active Problem List   Diagnosis Date Noted  . Delirium [R41.0] 03/17/2015    Priority: High  . Polypharmacy [Z79.899] 01/27/2015  . Dementia with behavioral disturbance [F03.91] 01/26/2015  . Hypokalemia [E87.6] 01/26/2015  . Pressure ulcer [L89.90] 01/25/2015  . Dehydration [E86.0] 01/24/2015  . UTI (lower urinary tract infection) [N39.0] 01/24/2015  . Metabolic encephalopathy [G93.41] 01/24/2015  . Hyponatremia [E87.1] 01/24/2015      Plan Of Care/Follow-up recommendations:  Activity:  As tolerated and directed by nursing facility Diet:  Heart healthy with low sodium.  Is patient on multiple antipsychotic therapies at discharge:  No   Has Patient had three or more failed trials of antipsychotic monotherapy by history:  No  Recommended Plan for Multiple Antipsychotic Therapies: NA  Beau Fanny, FNP-BC 03/18/2015, 1:08 PM

## 2015-03-18 NOTE — Consult Note (Signed)
[facility] Face-to-Face Psychiatry Consult   Reason for Consult:  Hallucinations Referring Physician:  EDP Patient Identification: [name] [name] MRN:  [id number] Principal Diagnosis: Delirium, psychosis NOS Diagnosis:   Patient Active Problem List   Diagnosis Date Noted  . Delirium [R41.0] 03/17/2015    Priority: High  . Polypharmacy [Z79.899] 01/27/2015  . Dementia with behavioral disturbance [F03.91] 01/26/2015  . Hypokalemia [E87.6] 01/26/2015  . Pressure ulcer [L89.90] 01/25/2015  . Dehydration [E86.0] 01/24/2015  . UTI (lower urinary tract infection) [N39.0] 01/24/2015  . Metabolic encephalopathy [G93.41] 01/24/2015  . Hyponatremia [E87.1] 01/24/2015    Total Time spent with patient: 15 minutes  Subjective:   [name] [name] is a 79 y.o. female patient admitted with hallucinations and confusion. Pt seen and chart reviewed with NP and MD team this AM. Pt continues to present with these complaints although she has improved to the point that she can discharge to a Skilled Nursing Facility.   HPI:   Patient was brought into the emergency room by ambulance as she was complaining of visual hallucination, confusion, feeling scared and noticed having crying spells.  Patient was resistant to answer question but also calling staff member to stay with her.  She endorsed that she is feeling things crawling all over the place.  Patient found to have UTI and she was given and about it.  Patient endorse history of depression and anxiety and she's been taking Latuda and Seroquel however unable to provide more information.  Patient has a daughter who is her main source of support.  It is unclear why patient is taking into psychotic medication.  Patient is unable to provide any history.  Her memory is impaired.  Patient lives in a nursing home at memory care unit.  As per chart she is throwing herself on the ground and broke her watch during the episode.  Patient appears incoherent, confused.  HPI  Elements:   Location:  Confusion, hallucination, depression and memory impairment. Quality:  Unable to function. Severity:  Severe.  Past Medical History:  Past Medical History  Diagnosis Date  . Dementia   . Anxiety   . MDD (major depressive disorder)   . Hypertension   . Head ache   . Tremor   . Osteoarthritis     Past Surgical History  Procedure Laterality Date  . Abdominal hysterectomy    . Hemorroidectomy    . Pterygium excision    . Foot surgery      right   Family History: History reviewed. No pertinent family history. Social History:  History  Alcohol Use No     History  Drug Use No    Social History   Social History  . Marital Status: Widowed    Spouse Name: N/A  . Number of Children: N/A  . Years of Education: N/A   Social History Main Topics  . Smoking status: Current Every Day Smoker    Start date: 01/24/2015  . Smokeless tobacco: None  . Alcohol Use: No  . Drug Use: No  . Sexual Activity: Not Asked   Other Topics Concern  . None   Social History Narrative   Additional Social History:    History of alcohol / drug use?: No history of alcohol / drug abuse                     Allergies:   Allergies  Allergen Reactions  . Abilify [Aripiprazole] Other (See Comments)    Caused pt to not be  able to walk and was unable to care for herself     Labs:  No results found for this or any previous visit (from the past 48 hour(s)).  Vitals: Blood pressure 134/57, pulse 82, temperature 97.8 F (36.6 C), temperature source Oral, resp. rate 18, SpO2 96 %.  Risk to Self: Suicidal Ideation: No Suicidal Intent: No Is patient at risk for suicide?: No Suicidal Plan?: No Access to Means: No What has been your use of drugs/alcohol within the last 12 months?: no How many times?: 0 Other Self Harm Risks: Confused, hallucinating Triggers for Past Attempts: None known Intentional Self Injurious Behavior: None Risk to Others: Homicidal Ideation:  No Thoughts of Harm to Others: No Current Homicidal Intent: No Current Homicidal Plan: No Access to Homicidal Means: No Identified Victim: none History of harm to others?: No Assessment of Violence: None Noted Violent Behavior Description: no Does patient have access to weapons?: No Criminal Charges Pending?: No Does patient have a court date: No Prior Inpatient Therapy: Prior Inpatient Therapy: No Prior Outpatient Therapy: Prior Outpatient Therapy: No Does patient have an ACCT team?: No Does patient have Intensive In-House Services?  : No Does patient have [facility] services? : No Does patient have P4CC services?: No  Current Facility-Administered Medications  Medication Dose Route Frequency Provider Last Rate Last Dose  . acetaminophen (TYLENOL) tablet 500 mg  500 mg Oral Q4H PRN Samuel Jester, DO      . cephALEXin (KEFLEX) capsule 500 mg  500 mg Oral QID Samuel Jester, DO   500 mg at 03/18/15 0958  . clonazePAM (KLONOPIN) tablet 0.5 mg  0.5 mg Oral BID Samuel Jester, DO   0.5 mg at 03/18/15 1610  . cycloSPORINE (RESTASIS) 0.05 % ophthalmic emulsion 1 drop  1 drop Both Eyes BID Samuel Jester, DO      . divalproex (DEPAKOTE) DR tablet 250 mg  250 mg Oral QODAY Samuel Jester, DO   250 mg at 03/18/15 9604  . docusate sodium (COLACE) capsule 100 mg  100 mg Oral BID Samuel Jester, DO   100 mg at 03/18/15 5409  . DULoxetine (CYMBALTA) DR capsule 60 mg  60 mg Oral BID Samuel Jester, DO   60 mg at 03/18/15 8119  . losartan (COZAAR) tablet 50 mg  50 mg Oral Daily Samuel Jester, DO   50 mg at 03/18/15 1478   And  . hydrochlorothiazide (MICROZIDE) capsule 12.5 mg  12.5 mg Oral Daily Samuel Jester, DO   12.5 mg at 03/18/15 2956  . lurasidone (LATUDA) tablet 40 mg  40 mg Oral Q lunch Samuel Jester, DO   40 mg at 03/17/15 1153  . meloxicam (MOBIC) tablet 15 mg  15 mg Oral Daily Samuel Jester, DO   15 mg at 03/18/15 2130  . pantoprazole (PROTONIX) EC tablet 40 mg   40 mg Oral Daily Samuel Jester, DO   40 mg at 03/18/15 8657   Current Outpatient Prescriptions  Medication Sig Dispense Refill  . acetaminophen (TYLENOL) 500 MG tablet Take 500 mg by mouth every 4 (four) hours as needed for fever (fever). For pain    . docusate sodium (COLACE) 100 MG capsule Take 100 mg by mouth 2 (two) times daily. For constipation    . guaiFENesin (ROBITUSSIN) 100 MG/5ML liquid Take 200 mg by mouth every 6 (six) hours as needed for cough.    . loperamide (IMODIUM) 2 MG capsule Take 2 mg by mouth as needed for diarrhea or loose stools.    Marland Kitchen  magnesium hydroxide (MILK OF MAGNESIA) 400 MG/5ML suspension Take 30 mLs by mouth daily as needed for mild constipation.    . meloxicam (MOBIC) 15 MG tablet Take 15 mg by mouth daily.    . QUEtiapine (SEROQUEL) 25 MG tablet Take 25 mg by mouth at bedtime.    Marland Kitchen alendronate (FOSAMAX) 70 MG tablet Take 1 tablet (70 mg total) by mouth once a week. Take with a full glass of water on an empty stomach. Takes on Mondays    . cefUROXime (CEFTIN) 500 MG tablet Take 1 tablet (500 mg total) by mouth 2 (two) times daily with a meal. (Patient not taking: Reported on 03/16/2015) 10 tablet 0  . cephALEXin (KEFLEX) 500 MG capsule Take 1 capsule (500 mg total) by mouth 4 (four) times daily. 40 capsule 0  . clonazePAM (KLONOPIN) 0.5 MG tablet Take 1 tablet (0.5 mg total) by mouth 2 (two) times daily. 30 tablet 0  . cycloSPORINE (RESTASIS) 0.05 % ophthalmic emulsion Place 1 drop into both eyes 2 (two) times daily. 0.4 mL   . divalproex (DEPAKOTE) 250 MG DR tablet Take 1 tablet (250 mg total) by mouth every other day.    . DULoxetine (CYMBALTA) 60 MG capsule Take 1 capsule (60 mg total) by mouth 2 (two) times daily.    Marland Kitchen losartan-hydrochlorothiazide (HYZAAR) 50-12.5 MG per tablet Take 1 tablet by mouth daily.    Marland Kitchen lurasidone (LATUDA) 40 MG TABS tablet Take 1 tablet (40 mg total) by mouth daily with lunch. 30 tablet   . omeprazole (PRILOSEC) 20 MG capsule Take  1 capsule (20 mg total) by mouth daily after breakfast.      Musculoskeletal: Strength & Muscle Tone: increased Gait & Station: unsteady Patient leans: Right, Left and Front  Psychiatric Specialty Exam: Physical Exam  Review of Systems  Psychiatric/Behavioral: Positive for depression. The patient is nervous/anxious and has insomnia.   All other systems reviewed and are negative.   Blood pressure 134/57, pulse 82, temperature 97.8 F (36.6 C), temperature source Oral, resp. rate 18, SpO2 96 %.There is no weight on file to calculate BMI.  General Appearance: Disheveled and Guarded  Eye Contact::  Poor  Speech:  Incoherent at times  Volume:  Decreased  Mood:  Anxious, Depressed and Dysphoric  Affect:  Inappropriate and Labile  Thought Process:  Disorganized  Orientation:  Other:  Poor  Thought Content:  Hallucinations: Auditory Visual, Paranoid Ideation and Rumination  Suicidal Thoughts:  No  Homicidal Thoughts:  No  Memory:  Immediate;   Poor Recent;   Poor Remote;   Poor  Judgement:  Impaired  Insight:  Lacking  Psychomotor Activity:  Increased, Decreased and Restlessness  Concentration:  Poor  Recall:  Poor  Fund of Knowledge:Poor  Language: Poor  Akathisia:  No  Handed:  Right  AIMS (if indicated):     Assets:  Housing  ADL's:  Impaired  Cognition: Impaired,  Moderate  Sleep:      Medical Decision Making: Review of Psycho-Social Stressors (1), Review or order clinical lab tests (1), Decision to obtain old records (1), Review and summation of old records (2), Established Problem, Worsening (2), New Problem, with no additional work-up planned (3), Review of Medication Regimen & Side Effects (2) and Review of New Medication or Change in Dosage (2)  Treatment Plan Summary: Send to SNF as below  Plan:  Continue home meds  Disposition:  -Discharge to SNF  Beau Fanny, FNP-BC 03/18/2015 1:06 PM  Patient seen face  to face for psychiatric evaluation. Chart  reviewed and finding discussed with Physician extender. Agreed with disposition and treatment plan.   Kathryne Sharper, MD

## 2015-03-18 NOTE — Progress Notes (Signed)
At the request of Ms Oravec a call was placed to her daughter, Dorothey Baseman, with a request to come and visit her in the hospital prior to being discharged. Call placed and Ms Orie Fisherman indicated she would call the unit and discuss time for either a visit or a phone call. When chaplain contacted the unit, Ms Orie Fisherman was on the phone with the attending nurse.  With the expectation of a discharge back to her care facility, no follow up spiritual care visit is anticipated.  Benjie Karvonen. Jamell Laymon, DMin, MDiv Chaplain

## 2015-03-18 NOTE — Progress Notes (Signed)
Chaplain follow-up. Ms [name] [name] appears calmer today than when seen yesterday. She reports she feels more safe, less stress. Her hands are shaking less and she notices she is breathing easier. In reflection she concedes that the "bugs" crawling on her ceiling at the care facility were not real, but states at the time the sight terrified her. She reports still being afraid of the medicines that she is given, asking to have nurses explain each pill she does not recognize. The vitamins she was frightened of yesterday is not problem today.   The major spiritual stress point is not seeing or at least hearing her daughter, [name] [name]. She repeated several times "I need to see my precious daughter, I know she wants to see me," [name] asked the chaplain to ask staff again to contact her daughter and ask her to come.   [name] reports that she cries a great deal when she is stressed and worries staff may think this is a problem.  Chaplain remains in good stead with [name]. Please page the chaplain if [name] needs or wishes further spiritual care.  Benjie Karvonen. Destyn Schuyler, DMin, MDiv Chaplain

## 2015-03-18 NOTE — ED Notes (Signed)
Increased confusion/disorientation.  One time antianxiety medication administered.  Waiting for [facility] transfer.

## 2015-03-19 LAB — URINE CULTURE: Culture: >=100000

## 2015-03-20 NOTE — Progress Notes (Addendum)
ED Antimicrobial Stewardship Positive Culture Follow Up   [name] [name] is an 79 y.o. female who presented to [facility] on 03/16/2015 with a chief complaint of  Chief Complaint  Patient presents with  . Hallucinations    Recent Results (from the past 720 hour(s))  Urine culture     Status: None   Collection Time: 03/16/15 11:07 AM  Result Value Ref Range Status   Specimen Description URINE, CATHETERIZED  Final   Special Requests NONE  Final   Culture   Final    >=100,000 COLONIES/mL ENTEROCOCCUS SPECIES >=100,000 COLONIES/mL AEROCOCCUS URINAE Performed at [facility]    Report Status 03/19/2015 FINAL  Final   Organism ID, Bacteria ENTEROCOCCUS SPECIES  Final      Susceptibility   Enterococcus species - MIC*    AMPICILLIN <=2 SENSITIVE Sensitive     LEVOFLOXACIN 1 SENSITIVE Sensitive     NITROFURANTOIN <=16 SENSITIVE Sensitive     VANCOMYCIN 1 SENSITIVE Sensitive     * >=100,000 COLONIES/mL ENTEROCOCCUS SPECIES     Treated with Keflex, organism resistant to prescribed antimicrobial  Plan: Fax results to [facility].  Reccomendation: Stop Keflex, Start Amoxicillin 875 mg PO BID for 5 days  ED Provider: Dierdre Forth, PA-C   Ancil Boozer 03/20/2015, 9:17 AM Infectious Diseases Pharmacist Phone# [phone]

## 2015-03-21 ENCOUNTER — Telehealth (HOSPITAL_BASED_OUTPATIENT_CLINIC_OR_DEPARTMENT_OTHER): Payer: Self-pay | Admitting: Emergency Medicine

## 2015-03-21 NOTE — Telephone Encounter (Signed)
Post ED Visit - Positive Culture Follow-up: Successful Patient Follow-Up  Culture assessed and recommendations reviewed by:  Celedonio Miyamoto, Pharm.D., BCPS-AQ ID  Georgina Pillion, Pharm.D., BCPS  [place], [address].D., BCPS, AAHIVP  Estella Husk, Pharm.D., BCPS, AAHIVP  Tegan Magsam, Pharm.D.  Tennis Must, Pharm.D.  Positive urine culture Enterococcus   Patient discharged without antimicrobial prescription and treatment is now indicated  Organism is resistant to prescribed ED discharge antimicrobial  Patient with positive blood cultures  Changes discussed with ED provider: Dierdre Forth  PA New antibiotic prescription stop keflex, start amoxicillin 875 mg po bid x 5 days Faxed recommendations to [facility] @ [phone]  Contacted patient, 03/21/15 @ 0834   Berle Mull 03/21/2015, 8:31 AM

## 2015-04-05 ENCOUNTER — Other Ambulatory Visit: Payer: Self-pay

## 2015-04-10 ENCOUNTER — Encounter (HOSPITAL_COMMUNITY): Payer: Self-pay | Admitting: Emergency Medicine

## 2015-04-10 ENCOUNTER — Inpatient Hospital Stay (HOSPITAL_COMMUNITY)
Admission: EM | Admit: 2015-04-10 | Discharge: 2015-04-20 | DRG: 683 | Disposition: A | Discharge status: Skilled Nursing Facility | Payer: Medicare Other | Attending: Internal Medicine | Admitting: Internal Medicine

## 2015-04-10 DIAGNOSIS — F0391 Unspecified dementia with behavioral disturbance: Secondary | ICD-10-CM | POA: Diagnosis present

## 2015-04-10 DIAGNOSIS — N289 Disorder of kidney and ureter, unspecified: Secondary | ICD-10-CM

## 2015-04-10 DIAGNOSIS — Z79899 Other long term (current) drug therapy: Secondary | ICD-10-CM

## 2015-04-10 DIAGNOSIS — F03918 Unspecified dementia, unspecified severity, with other behavioral disturbance: Secondary | ICD-10-CM | POA: Diagnosis present

## 2015-04-10 DIAGNOSIS — N390 Urinary tract infection, site not specified: Secondary | ICD-10-CM | POA: Diagnosis present

## 2015-04-10 DIAGNOSIS — N179 Acute kidney failure, unspecified: Secondary | ICD-10-CM

## 2015-04-10 DIAGNOSIS — I10 Essential (primary) hypertension: Secondary | ICD-10-CM | POA: Diagnosis present

## 2015-04-10 DIAGNOSIS — Z90710 Acquired absence of both cervix and uterus: Secondary | ICD-10-CM

## 2015-04-10 DIAGNOSIS — M1990 Unspecified osteoarthritis, unspecified site: Secondary | ICD-10-CM | POA: Diagnosis present

## 2015-04-10 DIAGNOSIS — I4581 Long QT syndrome: Secondary | ICD-10-CM | POA: Diagnosis present

## 2015-04-10 DIAGNOSIS — Z888 Allergy status to other drugs, medicaments and biological substances status: Secondary | ICD-10-CM

## 2015-04-10 DIAGNOSIS — F17210 Nicotine dependence, cigarettes, uncomplicated: Secondary | ICD-10-CM | POA: Diagnosis present

## 2015-04-10 DIAGNOSIS — E871 Hypo-osmolality and hyponatremia: Secondary | ICD-10-CM | POA: Diagnosis present

## 2015-04-10 DIAGNOSIS — R0600 Dyspnea, unspecified: Secondary | ICD-10-CM

## 2015-04-10 DIAGNOSIS — F322 Major depressive disorder, single episode, severe without psychotic features: Secondary | ICD-10-CM | POA: Diagnosis present

## 2015-04-10 DIAGNOSIS — E860 Dehydration: Secondary | ICD-10-CM | POA: Diagnosis present

## 2015-04-10 DIAGNOSIS — F419 Anxiety disorder, unspecified: Secondary | ICD-10-CM | POA: Diagnosis present

## 2015-04-10 DIAGNOSIS — E876 Hypokalemia: Secondary | ICD-10-CM | POA: Diagnosis present

## 2015-04-10 DIAGNOSIS — B9620 Unspecified Escherichia coli [E. coli] as the cause of diseases classified elsewhere: Secondary | ICD-10-CM | POA: Diagnosis present

## 2015-04-10 DIAGNOSIS — R251 Tremor, unspecified: Secondary | ICD-10-CM | POA: Diagnosis present

## 2015-04-10 LAB — CBC
HEMATOCRIT: 38.3 % (ref 36.0–46.0)
Hemoglobin: 13.2 g/dL (ref 12.0–15.0)
MCH: 31.3 pg (ref 26.0–34.0)
MCHC: 34.5 g/dL (ref 30.0–36.0)
MCV: 90.8 fL (ref 78.0–100.0)
PLATELETS: 225 10*3/uL (ref 150–400)
RBC: 4.22 MIL/uL (ref 3.87–5.11)
RDW: 12.6 % (ref 11.5–15.5)
WBC: 6.3 10*3/uL (ref 4.0–10.5)

## 2015-04-10 LAB — URINALYSIS, ROUTINE W REFLEX MICROSCOPIC
Glucose, UA: NEGATIVE mg/dL
KETONES UR: NEGATIVE mg/dL
NITRITE: NEGATIVE
PROTEIN: 30 mg/dL — AB
Specific Gravity, Urine: 1.015 (ref 1.005–1.030)
UROBILINOGEN UA: 1 mg/dL (ref 0.0–1.0)
pH: 5.5 (ref 5.0–8.0)

## 2015-04-10 LAB — URINE MICROSCOPIC-ADD ON

## 2015-04-10 LAB — CBG MONITORING, ED: GLUCOSE-CAPILLARY: 159 mg/dL — AB (ref 65–99)

## 2015-04-10 NOTE — ED Provider Notes (Signed)
CSN: [id number]     Arrival date & time 04/10/15  2041 History   First MD Initiated Contact with Patient 04/10/15 2118     Chief Complaint  Patient presents with  . Weakness     (Consider location/radiation/quality/duration/timing/severity/associated sxs/prior Treatment) HPI Comments: Pt is a 79 yo female with history of dementia, anxiety, MDD who presents to the ED via EMS from [facility] with complaint of weakness, onset 4 days. Nursing home reported to EMS that pt has been weak since Saturday. Pt reports neck pain and abdominal pain. History limited due to pt's mental status.    Past Medical History  Diagnosis Date  . Dementia   . Anxiety   . MDD (major depressive disorder)   . Hypertension   . Head ache   . Tremor   . Osteoarthritis    Past Surgical History  Procedure Laterality Date  . Abdominal hysterectomy    . Hemorroidectomy    . Pterygium excision    . Foot surgery      right   No family history on file. Social History  Substance Use Topics  . Smoking status: Current Every Day Smoker    Start date: 01/24/2015  . Smokeless tobacco: None  . Alcohol Use: No   OB History    No data available     Review of Systems  Musculoskeletal:       Neck pain  Neurological: Positive for weakness.      Allergies  Abilify  Home Medications   Prior to Admission medications   Medication Sig Start Date End Date Taking? Authorizing Provider  acetaminophen (TYLENOL) 500 MG tablet Take 500 mg by mouth every 6 (six) hours as needed for moderate pain.   Yes Historical Provider, MD  clonazePAM (KLONOPIN) 1 MG tablet Take 1 mg by mouth 2 (two) times daily.   Yes Historical Provider, MD  cycloSPORINE (RESTASIS) 0.05 % ophthalmic emulsion Place 1 drop into both eyes 2 (two) times daily. 03/18/15  Yes Beau Fanny, FNP  docusate sodium (COLACE) 100 MG capsule Take 100 mg by mouth 2 (two) times daily. For constipation   Yes Historical Provider, MD  DULoxetine (CYMBALTA)  60 MG capsule Take 1 capsule (60 mg total) by mouth 2 (two) times daily. 03/18/15  Yes Beau Fanny, FNP  guaifenesin (ROBITUSSIN) 100 MG/5ML syrup Take 200 mg by mouth 4 (four) times daily as needed for cough.   Yes Historical Provider, MD  loperamide (IMODIUM) 2 MG capsule Take 2 mg by mouth as needed for diarrhea or loose stools.   Yes Historical Provider, MD  losartan-hydrochlorothiazide (HYZAAR) 50-12.5 MG per tablet Take 1 tablet by mouth daily. 03/18/15  Yes Beau Fanny, FNP  lurasidone (LATUDA) 40 MG TABS tablet Take 1 tablet (40 mg total) by mouth daily with lunch. 03/18/15  Yes Beau Fanny, FNP  magnesium hydroxide (MILK OF MAGNESIA) 400 MG/5ML suspension Take 30 mLs by mouth daily as needed for mild constipation.   Yes Historical Provider, MD  Melatonin 3 MG TABS Take 1 tablet by mouth at bedtime.   Yes Historical Provider, MD  meloxicam (MOBIC) 15 MG tablet Take 15 mg by mouth daily.   Yes Historical Provider, MD  omeprazole (PRILOSEC) 20 MG capsule Take 1 capsule (20 mg total) by mouth daily after breakfast. 03/18/15  Yes Beau Fanny, FNP  rivastigmine (EXELON) 1.5 MG capsule Take 1.5 mg by mouth 2 (two) times daily.   Yes Historical Provider, MD  alendronate (  FOSAMAX) 70 MG tablet Take 1 tablet (70 mg total) by mouth once a week. Take with a full glass of water on an empty stomach. Takes on Mondays 03/18/15   Beau Fanny, FNP   BP 111/36 mmHg  Pulse 89  Temp(Src) 98 F (36.7 C) (Oral)  Resp 23  SpO2 96% Physical Exam  Constitutional: She appears well-developed and well-nourished. No distress.  HENT:  Head: Normocephalic and atraumatic.  Mouth/Throat: Oropharynx is clear and moist.  Eyes: Conjunctivae and EOM are normal. Pupils are equal, round, and reactive to light. Right eye exhibits no discharge. Left eye exhibits no discharge. No scleral icterus.  Neck: Normal range of motion. Neck supple.  Cardiovascular: Normal rate, regular rhythm, normal heart sounds and  intact distal pulses.   Pulmonary/Chest: Effort normal and breath sounds normal. She has no wheezes. She has no rales. She exhibits no tenderness.  Abdominal: Soft. Bowel sounds are normal. She exhibits no mass. There is tenderness (generalized TTP). There is no rebound and no guarding.  Musculoskeletal: She exhibits tenderness (Generalized tenderness of upper and lower extremities with light palpation. Midline c-spine TTP). She exhibits no edema.       Cervical back: She exhibits decreased range of motion and tenderness. She exhibits no swelling, no edema and no deformity.  Decreased ROM of neck due to pain.  Lymphadenopathy:    She has no cervical adenopathy.  Neurological: She is alert. No sensory deficit. Coordination normal.  Pt alert and oriented only to person and place. Pt able to follow commands and able to move upper and lower extremities.   Skin: Skin is warm and dry. No abrasion, no bruising and no rash noted.  Nursing note and vitals reviewed.   ED Course  Procedures (including critical care time) Labs Review Labs Reviewed  CBG MONITORING, ED - Abnormal; Notable for the following:    Glucose-Capillary 159 (*)    All other components within normal limits  BASIC METABOLIC PANEL  CBC  URINALYSIS, ROUTINE W REFLEX MICROSCOPIC (NOT AT [facility])  CBG MONITORING, ED    Imaging Review No results found. I have personally reviewed and evaluated these images and lab results as part of my medical decision-making.   EKG Interpretation None      Filed Vitals:   04/11/15 0130  BP: 132/50  Pulse: 77  Temp:   Resp: 17     MDM   Final diagnoses:  UTI (lower urinary tract infection)  Renal insufficiency    Pt presents with weakness reported by nursing home to EMS. History of dementia, pt oriented to persona and self. History limited. Tenderness noted to c-spine, abdomen and legs. VSS.   UA consistent with UTI. Potassium 3.0. Cr 2.01. CT head, cervical spine and abdomen  negative. Will consult hospitalist for UTI and renal insufficiency. Pt given 1g rocephin and potassium in ED. Hospitalist agree to admission. Orders placed for med-surg bed.    Satira Sark Lucas, [place] 04/11/15 0205  Arby Barrette, MD 04/14/15 2229

## 2015-04-10 NOTE — ED Notes (Signed)
Patient is from [place] states started having weakness since Saturday and developed a left sided lean. Patient normally walks with walker. Patient complains of headache. Vitals BP 110/60 HR 90 CBG 160. EMS states patient is was Normal Sinus on monitor.  Patent has Hx of Dementia

## 2015-04-11 ENCOUNTER — Emergency Department (HOSPITAL_COMMUNITY): Payer: Medicare Other

## 2015-04-11 ENCOUNTER — Encounter (HOSPITAL_COMMUNITY): Payer: Self-pay | Admitting: Radiology

## 2015-04-11 DIAGNOSIS — B9620 Unspecified Escherichia coli [E. coli] as the cause of diseases classified elsewhere: Secondary | ICD-10-CM | POA: Diagnosis present

## 2015-04-11 DIAGNOSIS — Z888 Allergy status to other drugs, medicaments and biological substances status: Secondary | ICD-10-CM | POA: Diagnosis not present

## 2015-04-11 DIAGNOSIS — E876 Hypokalemia: Secondary | ICD-10-CM | POA: Diagnosis present

## 2015-04-11 DIAGNOSIS — N289 Disorder of kidney and ureter, unspecified: Secondary | ICD-10-CM

## 2015-04-11 DIAGNOSIS — E860 Dehydration: Secondary | ICD-10-CM | POA: Diagnosis present

## 2015-04-11 DIAGNOSIS — I4581 Long QT syndrome: Secondary | ICD-10-CM | POA: Diagnosis present

## 2015-04-11 DIAGNOSIS — R251 Tremor, unspecified: Secondary | ICD-10-CM | POA: Diagnosis present

## 2015-04-11 DIAGNOSIS — M1990 Unspecified osteoarthritis, unspecified site: Secondary | ICD-10-CM | POA: Diagnosis present

## 2015-04-11 DIAGNOSIS — F419 Anxiety disorder, unspecified: Secondary | ICD-10-CM | POA: Diagnosis present

## 2015-04-11 DIAGNOSIS — F17210 Nicotine dependence, cigarettes, uncomplicated: Secondary | ICD-10-CM | POA: Diagnosis present

## 2015-04-11 DIAGNOSIS — E871 Hypo-osmolality and hyponatremia: Secondary | ICD-10-CM | POA: Diagnosis present

## 2015-04-11 DIAGNOSIS — N179 Acute kidney failure, unspecified: Secondary | ICD-10-CM

## 2015-04-11 DIAGNOSIS — Z90710 Acquired absence of both cervix and uterus: Secondary | ICD-10-CM | POA: Diagnosis not present

## 2015-04-11 DIAGNOSIS — F322 Major depressive disorder, single episode, severe without psychotic features: Secondary | ICD-10-CM | POA: Diagnosis present

## 2015-04-11 DIAGNOSIS — N390 Urinary tract infection, site not specified: Secondary | ICD-10-CM

## 2015-04-11 DIAGNOSIS — Z79899 Other long term (current) drug therapy: Secondary | ICD-10-CM | POA: Diagnosis not present

## 2015-04-11 DIAGNOSIS — F0391 Unspecified dementia with behavioral disturbance: Secondary | ICD-10-CM | POA: Diagnosis present

## 2015-04-11 DIAGNOSIS — I10 Essential (primary) hypertension: Secondary | ICD-10-CM | POA: Diagnosis present

## 2015-04-11 LAB — CBC
HEMATOCRIT: 38.3 % (ref 36.0–46.0)
HEMOGLOBIN: 13.4 g/dL (ref 12.0–15.0)
MCH: 31.7 pg (ref 26.0–34.0)
MCHC: 35 g/dL (ref 30.0–36.0)
MCV: 90.5 fL (ref 78.0–100.0)
Platelets: ADEQUATE 10*3/uL (ref 150–400)
RBC: 4.23 MIL/uL (ref 3.87–5.11)
RDW: 12.8 % (ref 11.5–15.5)
WBC: 9.6 10*3/uL (ref 4.0–10.5)

## 2015-04-11 LAB — HEPATIC FUNCTION PANEL
ALBUMIN: 3.5 g/dL (ref 3.5–5.0)
ALT: 22 U/L (ref 14–54)
AST: 23 U/L (ref 15–41)
Alkaline Phosphatase: 81 U/L (ref 38–126)
BILIRUBIN TOTAL: 1 mg/dL (ref 0.3–1.2)
Bilirubin, Direct: 0.3 mg/dL (ref 0.1–0.5)
Indirect Bilirubin: 0.7 mg/dL (ref 0.3–0.9)
Total Protein: 6.6 g/dL (ref 6.5–8.1)

## 2015-04-11 LAB — BASIC METABOLIC PANEL
ANION GAP: 15 (ref 5–15)
Anion gap: 16 — ABNORMAL HIGH (ref 5–15)
BUN: 24 mg/dL — AB (ref 6–20)
BUN: 21 mg/dL — ABNORMAL HIGH (ref 6–20)
CALCIUM: 10.4 mg/dL — AB (ref 8.9–10.3)
CALCIUM: 9.8 mg/dL (ref 8.9–10.3)
CO2: 21 mmol/L — ABNORMAL LOW (ref 22–32)
CO2: 21 mmol/L — ABNORMAL LOW (ref 22–32)
CREATININE: 2.01 mg/dL — AB (ref 0.44–1.00)
Chloride: 97 mmol/L — ABNORMAL LOW (ref 101–111)
Chloride: 99 mmol/L — ABNORMAL LOW (ref 101–111)
Creatinine, Ser: 1.63 mg/dL — ABNORMAL HIGH (ref 0.44–1.00)
GFR calc Af Amer: 26 mL/min — ABNORMAL LOW (ref >60)
GFR, EST AFRICAN AMERICAN: 34 mL/min — AB (ref >60)
GFR, EST NON AFRICAN AMERICAN: 23 mL/min — AB (ref >60)
GFR, EST NON AFRICAN AMERICAN: 29 mL/min — AB (ref >60)
GLUCOSE: 136 mg/dL — AB (ref 65–99)
Glucose, Bld: 100 mg/dL — ABNORMAL HIGH (ref 65–99)
POTASSIUM: 3 mmol/L — AB (ref 3.5–5.1)
Potassium: 3.8 mmol/L (ref 3.5–5.1)
SODIUM: 134 mmol/L — AB (ref 135–145)
Sodium: 135 mmol/L (ref 135–145)

## 2015-04-11 LAB — SODIUM, URINE, RANDOM: SODIUM UR: 67 mmol/L

## 2015-04-11 LAB — MRSA PCR SCREENING: MRSA by PCR: NEGATIVE

## 2015-04-11 LAB — LIPASE, BLOOD: Lipase: 27 U/L (ref 22–51)

## 2015-04-11 LAB — CREATININE, URINE, RANDOM: Creatinine, Urine: 195.97 mg/dL

## 2015-04-11 MED ORDER — RIVASTIGMINE TARTRATE 1.5 MG PO CAPS
1.5000 mg | ORAL_CAPSULE | Freq: Two times a day (BID) | ORAL | Status: DC
  Administered 2015-04-12: 1.5 mg via ORAL
  Filled 2015-04-11 (×2): qty 1

## 2015-04-11 MED ORDER — DOCUSATE SODIUM 100 MG PO CAPS
100.0000 mg | ORAL_CAPSULE | Freq: Two times a day (BID) | ORAL | Status: DC
  Administered 2015-04-11 – 2015-04-20 (×18): 100 mg via ORAL
  Filled 2015-04-11 (×19): qty 1

## 2015-04-11 MED ORDER — MELATONIN 3 MG PO TABS: 1.0000 | ORAL_TABLET | Freq: Every day | ORAL | Status: DC

## 2015-04-11 MED ORDER — CYCLOSPORINE 0.05 % OP EMUL
1.0000 [drp] | Freq: Two times a day (BID) | OPHTHALMIC | Status: DC
  Administered 2015-04-12 – 2015-04-20 (×16): 1 [drp] via OPHTHALMIC
  Filled 2015-04-11 (×20): qty 1

## 2015-04-11 MED ORDER — SODIUM CHLORIDE 0.9 % IV BOLUS (SEPSIS)
1000.0000 mL | Freq: Once | INTRAVENOUS | Status: AC
  Administered 2015-04-11: 1000 mL via INTRAVENOUS

## 2015-04-11 MED ORDER — DULOXETINE HCL 60 MG PO CPEP
60.0000 mg | ORAL_CAPSULE | Freq: Two times a day (BID) | ORAL | Status: DC
  Administered 2015-04-11 – 2015-04-12 (×3): 60 mg via ORAL
  Filled 2015-04-11 (×3): qty 1

## 2015-04-11 MED ORDER — ACETAMINOPHEN 325 MG PO TABS
650.0000 mg | ORAL_TABLET | ORAL | Status: DC | PRN
  Administered 2015-04-15: 650 mg via ORAL
  Filled 2015-04-11: qty 2

## 2015-04-11 MED ORDER — PANTOPRAZOLE SODIUM 40 MG PO TBEC
40.0000 mg | DELAYED_RELEASE_TABLET | Freq: Every day | ORAL | Status: DC
  Administered 2015-04-11 – 2015-04-20 (×10): 40 mg via ORAL
  Filled 2015-04-11 (×9): qty 1

## 2015-04-11 MED ORDER — FOSFOMYCIN TROMETHAMINE 3 G PO PACK
3.0000 g | PACK | Freq: Once | ORAL | Status: AC
  Administered 2015-04-11: 3 g via ORAL
  Filled 2015-04-11: qty 3

## 2015-04-11 MED ORDER — POTASSIUM CHLORIDE 10 MEQ/100ML IV SOLN
10.0000 meq | Freq: Once | INTRAVENOUS | Status: AC
  Administered 2015-04-11: 10 meq via INTRAVENOUS
  Filled 2015-04-11: qty 100

## 2015-04-11 MED ORDER — ENOXAPARIN SODIUM 40 MG/0.4ML ~~LOC~~ SOLN
40.0000 mg | SUBCUTANEOUS | Status: DC
  Administered 2015-04-11 – 2015-04-20 (×10): 40 mg via SUBCUTANEOUS
  Filled 2015-04-11 (×9): qty 0.4

## 2015-04-11 MED ORDER — FOSFOMYCIN TROMETHAMINE 3 G PO PACK
3.0000 g | PACK | Freq: Once | ORAL | Status: DC
  Filled 2015-04-11: qty 3

## 2015-04-11 MED ORDER — SODIUM CHLORIDE 0.9 % IV SOLN
INTRAVENOUS | Status: DC
  Administered 2015-04-11 – 2015-04-12 (×3): via INTRAVENOUS

## 2015-04-11 MED ORDER — LURASIDONE HCL 40 MG PO TABS
40.0000 mg | ORAL_TABLET | Freq: Every day | ORAL | Status: DC
  Administered 2015-04-11 – 2015-04-12 (×2): 40 mg via ORAL
  Filled 2015-04-11 (×2): qty 1

## 2015-04-11 MED ORDER — POTASSIUM CHLORIDE CRYS ER 20 MEQ PO TBCR
40.0000 meq | EXTENDED_RELEASE_TABLET | Freq: Once | ORAL | Status: DC
Start: 2015-04-11 — End: 2015-04-11

## 2015-04-11 MED ORDER — CLONAZEPAM 1 MG PO TABS
1.0000 mg | ORAL_TABLET | Freq: Two times a day (BID) | ORAL | Status: DC
  Administered 2015-04-11 – 2015-04-12 (×3): 1 mg via ORAL
  Filled 2015-04-11 (×3): qty 1

## 2015-04-11 MED ORDER — DEXTROSE 5 % IV SOLN
1.0000 g | Freq: Once | INTRAVENOUS | Status: AC
  Administered 2015-04-11: 1 g via INTRAVENOUS
  Filled 2015-04-11: qty 10

## 2015-04-11 NOTE — H&P (Signed)
History and Physical  [name] [name] ZOX:[id number] DOB: 1936-06-08 DOA: 04/10/2015  Referring physician: Melburn Hake, PA-C PCP: [name], MD   Chief Complaint: Patient sent from Nursing Home  HPI: [name] [name] is a 79 y.o. female with a past medical history significant for depression, advanced dementia, hypertension, and anxiety who presents with weakness and new AKI.  History is obtained from EMS.  Per nursing home staff, the patient had been progressively more sleepy and weak since four days ago.  The patient endorsed neck pain to ER staff and abdominal pain to me.  The abdominal pain is diffuse but patient is unable to further characterize.  NH did not report associated vomiting or diarrhea or change in oral intake.  In the ED, the patient was hemodynamically stable.  A CT head was unremarkable and non-contrast CT of the abdomen was likewise unremarkable.  She was given ceftriaxone for presumed UTI, K for hypokalemia, IV fluids for dehydration, and was noted to have an acute renal injury.   Review of Systems:  Patient seen 0200. Pt has abdominal pain, mild. Remainder of review of systems unable to be reliably obtained due to patient mentation.    Past Medical History  Diagnosis Date  . Dementia   . Anxiety   . MDD (major depressive disorder)   . Hypertension   . Head ache   . Tremor   . Osteoarthritis    Past Surgical History  Procedure Laterality Date  . Abdominal hysterectomy    . Hemorroidectomy    . Pterygium excision    . Foot surgery      right   Social History:  Patient lives at Nursing Home.  She has severe dementia at baseline and appears dependent for all ADLs.  Remainder of SH unable to be obtained because of patient mentation.  Allergies  Allergen Reactions  . Abilify [Aripiprazole] Other (See Comments)    Caused pt to not be able to walk and was unable to care for herself     History reviewed. No pertinent family history.  Unable to obtain  further due to patient mentation.  Prior to Admission medications   Medication Sig Start Date End Date Taking? Authorizing Provider  acetaminophen (TYLENOL) 500 MG tablet Take 500 mg by mouth every 6 (six) hours as needed for moderate pain.   Yes Historical Provider, MD  clonazePAM (KLONOPIN) 1 MG tablet Take 1 mg by mouth 2 (two) times daily.   Yes Historical Provider, MD  cycloSPORINE (RESTASIS) 0.05 % ophthalmic emulsion Place 1 drop into both eyes 2 (two) times daily. 03/18/15  Yes Beau Fanny, FNP  docusate sodium (COLACE) 100 MG capsule Take 100 mg by mouth 2 (two) times daily. For constipation   Yes Historical Provider, MD  DULoxetine (CYMBALTA) 60 MG capsule Take 1 capsule (60 mg total) by mouth 2 (two) times daily. 03/18/15  Yes Beau Fanny, FNP  guaifenesin (ROBITUSSIN) 100 MG/5ML syrup Take 200 mg by mouth 4 (four) times daily as needed for cough.   Yes Historical Provider, MD  loperamide (IMODIUM) 2 MG capsule Take 2 mg by mouth as needed for diarrhea or loose stools.   Yes Historical Provider, MD  losartan-hydrochlorothiazide (HYZAAR) 50-12.5 MG per tablet Take 1 tablet by mouth daily. 03/18/15  Yes Beau Fanny, FNP  lurasidone (LATUDA) 40 MG TABS tablet Take 1 tablet (40 mg total) by mouth daily with lunch. 03/18/15  Yes Beau Fanny, FNP  magnesium hydroxide (MILK OF MAGNESIA) 400 MG/5ML suspension  Take 30 mLs by mouth daily as needed for mild constipation.   Yes Historical Provider, MD  Melatonin 3 MG TABS Take 1 tablet by mouth at bedtime.   Yes Historical Provider, MD  meloxicam (MOBIC) 15 MG tablet Take 15 mg by mouth daily.   Yes Historical Provider, MD  omeprazole (PRILOSEC) 20 MG capsule Take 1 capsule (20 mg total) by mouth daily after breakfast. 03/18/15  Yes Beau Fanny, FNP  rivastigmine (EXELON) 1.5 MG capsule Take 1.5 mg by mouth 2 (two) times daily.   Yes Historical Provider, MD  alendronate (FOSAMAX) 70 MG tablet Take 1 tablet (70 mg total) by mouth once a  week. Take with a full glass of water on an empty stomach. Takes on Mondays 03/18/15   Beau Fanny, FNP    Physical Exam: BP 138/54 mmHg  Pulse 79  Temp(Src) 98 F (36.7 C) (Oral)  Resp 15  SpO2 97% General: Elderly female, in no acute distress.  Responds to voice. HEENT: Conjunctivae inflamed.  No icterus.  EOMI and PERRL.  Lips dry and cracked.  Nose negative.     Cardiac: RRR, nl S1-S2, no murmurs, rubs, gallops.  Capillary refill is less than 2 seconds.   Respiratory: Normal respiratory rate and rhythm.  CTAB without rales or wheezes. Abdomen: BS present.  Diffuse TTP.  No masses appreciated.  No rebound.  No rigidity.  Voluntary guarding is present.  No ascites, distension. Extremities: No deformities/injuries.  Extremities are warm and well-perfused. Skin: Warm and dry.  No suspicious rashes on face, neck, chest, abdomen, extremities. Neuro: Lethargic.  Patient oriented to self, not place or time or situation.  Muscle tone globally diminished.    Psych: Flat affect.  No obvious aural or visual hallucinations or delusions. Attention dimished.         Labs on Admission:  Basic Metabolic Panel:  Recent Labs Lab 04/10/15 2101  NA 134*  K 3.0*  CL 97*  CO2 21*  GLUCOSE 136*  BUN 24*  CREATININE 2.01*  CALCIUM 10.4*   Liver Function Tests:  Recent Labs Lab 04/10/15 2101  AST 23  ALT 22  ALKPHOS 81  BILITOT 1.0  PROT 6.6  ALBUMIN 3.5    Recent Labs Lab 04/10/15 2101  LIPASE 27   No results for input(s): AMMONIA in the last 168 hours. CBC:  Recent Labs Lab 04/10/15 2101  WBC 6.3  HGB 13.2  HCT 38.3  MCV 90.8  PLT 225   Cardiac Enzymes: No results for input(s): CKTOTAL, CKMB, CKMBINDEX, TROPONINI in the last 168 hours.  BNP (last 3 results) No results for input(s): BNP in the last 8760 hours.  ProBNP (last 3 results) No results for input(s): PROBNP in the last 8760 hours.  CBG:  Recent Labs Lab 04/10/15 2048  GLUCAP 159*     Radiological Exams on Admission: Ct Abdomen Pelvis Wo Contrast 04/11/2015 IMPRESSION: No acute intra-abdominal or pelvic pathology.  No hydronephrosis or obstructing stone. Small nonobstructing right renal calculi and a stable fat containing left renal upper pole lesion most compatible with an angiomyolipoma.  Diverticulosis.  No evidence of bowel obstruction or inflammation.    Ct Head Wo Contrast 04/11/2015    IMPRESSION:  1.  No acute intracranial abnormality.  2. No acute bony abnormality of the cervical spine. Mild multilevel degenerative change.      EKG: Independently reviewed. Sinus rhythm.  No ST changes.  QTc 539 ms.  Assessment/Plan Present on Admission:  . UTI (  lower urinary tract infection) . Dehydration . Hyponatremia . Dementia with behavioral disturbance . Hypokalemia    1. Acute kidney injury: Suspect pre-renal azotemia from dehydration in setting of ARB and meloxicam. - Fluid resuscitation in ER - Continue NS at 100 cc/hr - Trend BMP - Urine electrolytes pending - Hold ARB and NSAIDs - Avoid nephrotoxins  2. UTI: Patient with suprapubic pain and change in urinalysis.  Non-obstructing renal calculus present, but without systemic symptoms, this appears at this time to be an uncomplicated UTI if anything at all. - Follow urine culture - Given ceftriaxone in ER - Fosfomycin 3g once for uncomplicated UTI  3. Hyponatremia: Suspect hypovolemic.   - Serum osm and urine electrolytes  4. Hypokalemia: Repleted in Er Repeat BMP  5. QT prolongation: QT 540, up from 470 ms in July. Suspect lurasidone. - Avoid QT prolonging agents.  6. Chronic medical issues: Continue duloxetine and lurasidone for depression Continue clonazepam for anxiety Continue rivastigmine for dementia Continue topical cyclosporine for dry eyes Continue PPI       Code Status: Full  Disposition Plan:  The appropriate admission status for this patient is INPATIENT. Inpatient  status is judged to be reasonable and necessary in order to provide the required intensity of service to ensure the patient's safety. The patient's presenting symptoms, physical exam findings, and initial radiographic and laboratory data in the context of their chronic comorbidities is felt to place them at high risk for further clinical deterioration. Furthermore, it is not anticipated that the patient will be medically stable for discharge from the hospital within 2 midnights of admission. The following factors support the admission status of inpatient.   A. The patient's presenting symptoms include weakness and abdominal pain. B. The worrisome physical exam findings include abdominal tenderness. C. The initial radiographic and laboratory data are worrisome because of acute kidney injury, hypokalemia, inflamed urine sediment, and hyponatremia. D. The chronic co-morbidities include dementia, hypertension, depression. E. Patient requires inpatient status due to high intensity of service, high risk for further deterioration and high frequency of surveillance required. F. I certify that at the point of admission it is my clinical judgment that the patient will require inpatient hospital care spanning beyond 2 midnights from the point of admission.     Alberteen Sam [facility] Pager [phone]

## 2015-04-11 NOTE — Plan of Care (Signed)
Problem: SLP Dysphagia Goals Goal: Patient will utilize recommended strategies Patient will utilize recommended strategies during swallow to increase swallowing safety with Outcome: Progressing Small sips/bites, check for oral pocketing, upright posture

## 2015-04-11 NOTE — Progress Notes (Signed)
Patient Demographics  [name] [name], is a 79 y.o. female, DOB - 12-Mar-1936, ZOX:[id number]  Admit date - 04/10/2015   Admitting Physician Alberteen Sam, MD  Outpatient Primary MD for the patient is [name], MD  LOS - 0   Chief Complaint  Patient presents with  . Weakness         Subjective:   [name] [name] today has, No headache, No chest pain, No abdominal pain - No Nausea,   Assessment & Plan    Principal Problem:   Dehydration Active Problems:   UTI (lower urinary tract infection)   Hyponatremia   Dementia with behavioral disturbance   Hypokalemia   Acute kidney injury   Renal insufficiency  Acute renal failure - Prerenal acidemia from dehydration - Continue to hold ARB and NSAIDs(meloxicam) - Improving with IV fluids, creatinine today is 1.6  UTI -  follow on urine culture , received 1 dose of IV Rocephin in ED ,   Treated with oral fosfomycin .  Hyponatremia - Secondary to volume depletion, resolved with IV fluid  Hypokalemia - Repleted   QTc prolongation  -  not on any QTc prolonging medication , recheck EKG in a.m. Marland Kitchen  Chronic medical issues: Continue duloxetine and lurasidone for depression Continue clonazepam for anxiety Continue rivastigmine for dementia Continue topical cyclosporine for dry eyes Continue PPI  Code Status: Full   Family Communication: none at bedside  Disposition Plan: pending further workup   Procedures  none   Consults  none   Medications  Scheduled Meds: . clonazePAM  1 mg Oral BID  . [START ON 04/12/2015] cycloSPORINE  1 drop Both Eyes BID  . docusate sodium  100 mg Oral BID  . DULoxetine  60 mg Oral BID  . enoxaparin (LOVENOX) injection  40 mg Subcutaneous Q24H  . fosfomycin  3 g Oral Once  . lurasidone  40 mg Oral Q lunch  . pantoprazole  40 mg Oral Daily  . [START ON 04/12/2015] rivastigmine  1.5 mg  Oral BID   Continuous Infusions: . sodium chloride 100 mL/hr at 04/11/15 0347   PRN Meds:.  DVT Prophylaxis  Lovenox -  Lab Results  Component Value Date   PLT  04/11/2015    PLATELET CLUMPS NOTED ON SMEAR, COUNT APPEARS ADEQUATE   Antibiotics   Anti-infectives    Start     Dose/Rate Route Frequency Ordered Stop   04/11/15 0100  cefTRIAXone (ROCEPHIN) 1 g in dextrose 5 % 50 mL IVPB     1 g 100 mL/hr over 30 Minutes Intravenous  Once 04/11/15 0053 04/11/15 0148          Objective:   Filed Vitals:   04/11/15 0130 04/11/15 0200 04/11/15 0230 04/11/15 0339  BP: 132/50 138/54 138/63 138/49  Pulse: 77 79 79 85  Temp:    98.6 F (37 C)  TempSrc:    Oral  Resp: 17 15 17 18   SpO2: 97% 97% 96% 97%    Wt Readings from Last 3 Encounters:  01/24/15 74.526 kg (164 lb 4.8 oz)     Intake/Output Summary (Last 24 hours) at 04/11/15 1249 Last data filed at 04/11/15 8119  Gross per 24 hour  Intake 221.67 ml  Output  0 ml  Net 221.67 ml     Physical Exam  Awake ,  confused  .AT,PERRAL Supple Neck,No JVD,  Symmetrical Chest wall movement, Good air movement bilaterally, RRR,No Gallops,Rubs or new Murmurs,  +ve B.Sounds, Abd Soft, No tenderness, No organomegaly appriciated, No rebound - guarding or rigidity. No Cyanosis, Clubbing or edema, No new Rash or bruise    Data Review   Micro Results Recent Results (from the past 240 hour(s))  MRSA PCR Screening     Status: None   Collection Time: 04/11/15  4:00 AM  Result Value Ref Range Status   MRSA by PCR NEGATIVE NEGATIVE Final    Comment:        The GeneXpert MRSA Assay (FDA approved for NASAL specimens only), is one component of a comprehensive MRSA colonization surveillance program. It is not intended to diagnose MRSA infection nor to guide or monitor treatment for MRSA infections.     Radiology Reports Ct Abdomen Pelvis Wo Contrast  04/11/2015   CLINICAL DATA:  79 year old female with abdominal  pain  EXAM: CT ABDOMEN AND PELVIS WITHOUT CONTRAST  TECHNIQUE: Multidetector CT imaging of the abdomen and pelvis was performed following the standard protocol without IV contrast.  COMPARISON:  CT dated 09/21/2010  FINDINGS: Evaluation of this exam is limited in the absence of intravenous contrast. Evaluation is also limited due to streak artifact caused by patient's arms.  Mild emphysematous changes of the visualized lung bases. Bibasilar dependent atelectatic changes noted. No intra-abdominal free air or free fluid.  Cholecystectomy. The liver, pancreas, spleen, adrenal glands appear unremarkable. Small 2-3 mm nonobstructing right renal calculi. There is a stable 1 cm left renal upper pole hypodense lesion containing fat density most compatible with an angiomyolipoma. There is no hydronephrosis on either side. The visualized ureters and urinary bladder appear unremarkable. Hysterectomy.  Sigmoid diverticulosis without active inflammation. No evidence of bowel obstruction or inflammation. Normal appendix.  There is aortoiliac atherosclerotic disease. No portal venous gas. There is no lymphadenopathy. The abdominal wall soft tissues appear unremarkable. There is degenerative changes of the spine. No acute fractures.  IMPRESSION: No acute intra-abdominal or pelvic pathology.  No hydronephrosis or obstructing stone. Small nonobstructing right renal calculi and a stable fat containing left renal upper pole lesion most compatible with an angiomyolipoma.  Diverticulosis.  No evidence of bowel obstruction or inflammation.   Electronically Signed   By: Elgie Collard M.D.   On: 04/11/2015 01:20   Dg Chest 2 View  03/16/2015   CLINICAL DATA:  Mental status changes today.  EXAM: CHEST  2 VIEW  COMPARISON:  01/24/2015.  FINDINGS: The cardiac silhouette, mediastinal and hilar contours are within normal limits and stable. The lungs are clear. Chronic bronchitic changes and upper lobe scarring changes. No pleural effusion.  The bony thorax is intact.  IMPRESSION: Chronic lung changes but no acute pulmonary findings.   Electronically Signed   By: Rudie Meyer M.D.   On: 03/16/2015 12:02   Ct Head Wo Contrast  04/11/2015   CLINICAL DATA:  Weakness for 3 days. Headache. Stated history of fall.  EXAM: CT HEAD WITHOUT CONTRAST  CT CERVICAL SPINE WITHOUT CONTRAST  TECHNIQUE: Multidetector CT imaging of the head and cervical spine was performed following the standard protocol without intravenous contrast. Multiplanar CT image reconstructions of the cervical spine were also generated.  COMPARISON:  Head CT 03/16/2015  FINDINGS: CT HEAD FINDINGS  Unchanged atrophy and chronic small vessel ischemia.No intracranial hemorrhage, mass effect, or midline  shift. No hydrocephalus. The basilar cisterns are patent. No evidence of territorial infarct. No intracranial fluid collection. Calvarium is intact. Included paranasal sinuses and mastoid air cells are well aerated.  CT CERVICAL SPINE FINDINGS  There is no fracture or acute subluxation. The dens is intact. There are no jumped or perched facets. Disc space narrowing and endplate spurring at C4-C5, C5-C6, and C6-C7. Scattered facet arthropathy. No prevertebral soft tissue edema.  IMPRESSION: 1.  No acute intracranial abnormality. 2. No acute bony abnormality of the cervical spine. Mild multilevel degenerative change.   Electronically Signed   By: Rubye Oaks M.D.   On: 04/11/2015 01:15   Ct Head Wo Contrast  03/16/2015   CLINICAL DATA:  Altered mental status, history of dementia  EXAM: CT HEAD WITHOUT CONTRAST  TECHNIQUE: Contiguous axial images were obtained from the base of the skull through the vertex without intravenous contrast.  COMPARISON:  01/05/2014  FINDINGS: No skull fracture is noted. Paranasal sinuses and mastoid air cells are unremarkable. Mild cerebral atrophy. Mild periventricular white matter decreased attenuation consistent with chronic small vessel ischemic changes. No  definite acute cortical infarction. No intracranial hemorrhage, mass effect or midline shift. No mass lesion is noted on this unenhanced scan.  IMPRESSION: No acute intracranial abnormality. Mild cerebral atrophy. Mild periventricular white matter decreased attenuation consistent with chronic small vessel ischemic changes.   Electronically Signed   By: Natasha Mead M.D.   On: 03/16/2015 11:26   Ct Cervical Spine Wo Contrast  04/11/2015   CLINICAL DATA:  Weakness for 3 days. Headache. Stated history of fall.  EXAM: CT HEAD WITHOUT CONTRAST  CT CERVICAL SPINE WITHOUT CONTRAST  TECHNIQUE: Multidetector CT imaging of the head and cervical spine was performed following the standard protocol without intravenous contrast. Multiplanar CT image reconstructions of the cervical spine were also generated.  COMPARISON:  Head CT 03/16/2015  FINDINGS: CT HEAD FINDINGS  Unchanged atrophy and chronic small vessel ischemia.No intracranial hemorrhage, mass effect, or midline shift. No hydrocephalus. The basilar cisterns are patent. No evidence of territorial infarct. No intracranial fluid collection. Calvarium is intact. Included paranasal sinuses and mastoid air cells are well aerated.  CT CERVICAL SPINE FINDINGS  There is no fracture or acute subluxation. The dens is intact. There are no jumped or perched facets. Disc space narrowing and endplate spurring at C4-C5, C5-C6, and C6-C7. Scattered facet arthropathy. No prevertebral soft tissue edema.  IMPRESSION: 1.  No acute intracranial abnormality. 2. No acute bony abnormality of the cervical spine. Mild multilevel degenerative change.   Electronically Signed   By: Rubye Oaks M.D.   On: 04/11/2015 01:15     CBC  Recent Labs Lab 04/10/15 2101 04/11/15 0400  WBC 6.3 9.6  HGB 13.2 13.4  HCT 38.3 38.3  PLT 225 PLATELET CLUMPS NOTED ON SMEAR, COUNT APPEARS ADEQUATE  MCV 90.8 90.5  MCH 31.3 31.7  MCHC 34.5 35.0  RDW 12.6 12.8    Chemistries   Recent Labs Lab  04/10/15 2101 04/11/15 0400  NA 134* 135  K 3.0* 3.8  CL 97* 99*  CO2 21* 21*  GLUCOSE 136* 100*  BUN 24* 21*  CREATININE 2.01* 1.63*  CALCIUM 10.4* 9.8  AST 23  --   ALT 22  --   ALKPHOS 81  --   BILITOT 1.0  --    ------------------------------------------------------------------------------------------------------------------ CrCl cannot be calculated (Unknown ideal weight.). ------------------------------------------------------------------------------------------------------------------ No results for input(s): HGBA1C in the last 72 hours. ------------------------------------------------------------------------------------------------------------------ No results for input(s): CHOL,  HDL, LDLCALC, TRIG, CHOLHDL, LDLDIRECT in the last 72 hours. ------------------------------------------------------------------------------------------------------------------ No results for input(s): TSH, T4TOTAL, T3FREE, THYROIDAB in the last 72 hours.  Invalid input(s): FREET3 ------------------------------------------------------------------------------------------------------------------ No results for input(s): VITAMINB12, FOLATE, FERRITIN, TIBC, IRON, RETICCTPCT in the last 72 hours.  Coagulation profile No results for input(s): INR, PROTIME in the last 168 hours.  No results for input(s): DDIMER in the last 72 hours.  Cardiac Enzymes No results for input(s): CKMB, TROPONINI, MYOGLOBIN in the last 168 hours.  Invalid input(s): CK ------------------------------------------------------------------------------------------------------------------ Invalid input(s): POCBNP     Time Spent in minutes   30 minutes   Apollos Tenbrink M.D on 04/11/2015 at 12:49 PM  Between 7am to 7pm - Pager - [phone]  After 7pm go to [email or web address] - password [facility]  [facility]   Office  [phone]

## 2015-04-11 NOTE — ED Notes (Signed)
Pt yelling from room; pt reports "being scared" and stating "don't leave me;" denies pain at present. Pt resting with eyes closed

## 2015-04-11 NOTE — Evaluation (Signed)
Clinical/Bedside Swallow Evaluation Patient Details  Name: [name] [name] MRN: [id number] Date of Birth: 1935-07-31  Today's Date: 04/11/2015 Time: SLP Start Time (ACUTE ONLY): 4098 SLP Stop Time (ACUTE ONLY): 0945 SLP Time Calculation (min) (ACUTE ONLY): 20 min  Past Medical History:  Past Medical History  Diagnosis Date  . Dementia   . Anxiety   . MDD (major depressive disorder)   . Hypertension   . Head ache   . Tremor   . Osteoarthritis    Past Surgical History:  Past Surgical History  Procedure Laterality Date  . Abdominal hysterectomy    . Hemorroidectomy    . Pterygium excision    . Foot surgery      right   HPI:  [name] [name] is a 79 y.o. female with a PMH significant for depression, advanced dementia, HTN, and anxiety who presented to the ED on 04/10/15 after complaints of becoming progressively smore sleep and weak for four days prior to admission, per SNF staff. CT head indicated no acute intracranial abnormality, Pt's current complaints are acute kidney injury and weakness. Pt's diet is currently regular solid, thin liquid diet.    Assessment / Plan / Recommendation Clinical Impression  Pt presents with mild-moderate oral dysphagia which may be consistent with baseline dementia in addition to acute confusion secondary to UTI and decreased arousal. Pt demonstrated overall weakened and reduced oral functions and consumes large bolus sizes which results in significant oral pocketing which limits ability to manipulate and transit bolus. SLP controlled bolus size and and provided the pt with verbal cues to clear her mouth and check her mouth for pocketed food. SLP recommends dysphagia 2 (fine chop) and thin liquids, meds crushed in puree, and full supervision to assist with safe feeding. Pt did not show any s/sx of aspiration, however does have a mild-moderate risk due to AMS and large bolus consumption. SLP educated RN on diet recommendations and strategies, will f/u with  check for diet tolerance and use of strategies.     Aspiration Risk  Mild-Moderate    Diet Recommendation Dysphagia 2 (Fine chop);Thin   Medication Administration: Crushed with puree Compensations: Small sips/bites;Check for pocketing    Other  Recommendations Oral Care Recommendations: Oral care BID   Follow Up Recommendations       Frequency and Duration min 2x/week  2 weeks   Pertinent Vitals/Pain NA    SLP Swallow Goals     Swallow Study Prior Functional Status       General Other Pertinent Information: [name] [name] is a 79 y.o. female with a PMH significant for depression, advanced dementia, HTN, and anxiety who presented to the ED on 04/10/15 after complaints of becoming progressively smore sleep and weak for four days prior to admission, per SNF staff. CT head indicated no acute intracranial abnormality, Pt's current complaints are acute kidney injury and weakness. Pt's diet is currently regular solid, thin liquid diet.  Type of Study: Bedside swallow evaluation Diet Prior to this Study: Regular;Thin liquids Temperature Spikes Noted: No Respiratory Status: Room air Behavior/Cognition: Alert;Cooperative;Requires cueing Oral Cavity - Dentition: Missing dentition;Poor condition Self-Feeding Abilities: Needs assist Patient Positioning: Upright in bed Baseline Vocal Quality: Low vocal intensity Volitional Cough: Weak Volitional Swallow: Able to elicit    Oral/Motor/Sensory Function Overall Oral Motor/Sensory Function: Impaired at baseline Labial Strength: Reduced Lingual ROM: Reduced right;Reduced left Lingual Strength: Reduced   Ice Chips Ice chips: Not tested   Thin Liquid Thin Liquid: Within functional limits Presentation: Cup;Self Fed;Straw  Nectar Thick Nectar Thick Liquid: Not tested   Honey Thick Honey Thick Liquid: Not tested   Puree Puree: Not tested   Solid   GO    Solid: Impaired Presentation: Self Fed Oral Phase Impairments: Reduced lingual  movement/coordination Oral Phase Functional Implications: Oral holding Other Comments: impulsively puts too much food in mouth, resulting in pocketing and needing to remove food      Riccardo Dubin, Student-SLP  [employer] 04/11/2015,10:10 AM

## 2015-04-11 NOTE — ED Notes (Signed)
Admitting MD at bedside.

## 2015-04-11 NOTE — Progress Notes (Signed)
Called ED RN for report. Room ready for admit.  

## 2015-04-12 LAB — BASIC METABOLIC PANEL
Anion gap: 8 (ref 5–15)
BUN: 8 mg/dL (ref 6–20)
CALCIUM: 9 mg/dL (ref 8.9–10.3)
CO2: 25 mmol/L (ref 22–32)
CREATININE: 0.73 mg/dL (ref 0.44–1.00)
Chloride: 103 mmol/L (ref 101–111)
GFR calc non Af Amer: >60 mL/min (ref >60)
GLUCOSE: 103 mg/dL — AB (ref 65–99)
Potassium: 2.6 mmol/L — CL (ref 3.5–5.1)
Sodium: 136 mmol/L (ref 135–145)

## 2015-04-12 LAB — CBC
HCT: 33.2 % — ABNORMAL LOW (ref 36.0–46.0)
HEMOGLOBIN: 11.3 g/dL — AB (ref 12.0–15.0)
MCH: 30.5 pg (ref 26.0–34.0)
MCHC: 34 g/dL (ref 30.0–36.0)
MCV: 89.7 fL (ref 78.0–100.0)
Platelets: 203 10*3/uL (ref 150–400)
RBC: 3.7 MIL/uL — AB (ref 3.87–5.11)
RDW: 12.4 % (ref 11.5–15.5)
WBC: 5.4 10*3/uL (ref 4.0–10.5)

## 2015-04-12 LAB — MAGNESIUM: Magnesium: 1.2 mg/dL — ABNORMAL LOW (ref 1.7–2.4)

## 2015-04-12 MED ORDER — POTASSIUM CHLORIDE 10 MEQ/100ML IV SOLN
10.0000 meq | INTRAVENOUS | Status: AC
Start: 2015-04-12 — End: 2015-04-12
  Administered 2015-04-12 (×5): 10 meq via INTRAVENOUS
  Filled 2015-04-12 (×5): qty 100

## 2015-04-12 MED ORDER — POTASSIUM CHLORIDE CRYS ER 20 MEQ PO TBCR
40.0000 meq | EXTENDED_RELEASE_TABLET | ORAL | Status: AC
  Administered 2015-04-12 (×2): 40 meq via ORAL
  Filled 2015-04-12 (×2): qty 2

## 2015-04-12 MED ORDER — MAGNESIUM SULFATE 2 GM/50ML IV SOLN
2.0000 g | Freq: Once | INTRAVENOUS | Status: AC
  Administered 2015-04-12: 2 g via INTRAVENOUS
  Filled 2015-04-12: qty 50

## 2015-04-12 MED ORDER — CLONAZEPAM 1 MG PO TABS
1.0000 mg | ORAL_TABLET | Freq: Three times a day (TID) | ORAL | Status: DC
  Administered 2015-04-13 – 2015-04-20 (×23): 1 mg via ORAL
  Filled 2015-04-12 (×24): qty 1

## 2015-04-12 MED ORDER — DULOXETINE HCL 60 MG PO CPEP
60.0000 mg | ORAL_CAPSULE | Freq: Every day | ORAL | Status: DC
  Administered 2015-04-13: 60 mg via ORAL
  Filled 2015-04-12: qty 1

## 2015-04-12 MED ORDER — RISPERIDONE 0.5 MG PO TABS
0.5000 mg | ORAL_TABLET | Freq: Every day | ORAL | Status: DC
  Filled 2015-04-12 (×2): qty 1

## 2015-04-12 MED ORDER — RIVASTIGMINE TARTRATE 3 MG PO CAPS
3.0000 mg | ORAL_CAPSULE | Freq: Two times a day (BID) | ORAL | Status: DC
  Administered 2015-04-12 – 2015-04-20 (×17): 3 mg via ORAL
  Filled 2015-04-12 (×19): qty 1

## 2015-04-12 NOTE — Progress Notes (Signed)
Results for GERALDENE, EISEL (MRN [id number]) as of 04/12/2015 06:18  Ref. Range 04/12/2015 04:40  Potassium Latest Ref Range: 3.5-5.1 mmol/L 2.6 (LL)  Paged MD. Awaiting call back.

## 2015-04-12 NOTE — Clinical Social Work Note (Signed)
Clinical Social Work Assessment  Patient Details  Name: [name] [name] MRN: [id number] Date of Birth: [birth year]  Date of referral:  04/11/15               Reason for consult:  Facility Placement                Permission sought to share information with:  Family Supports (Spoke with daughter as patient only oriented to person and place ) Permission granted to share information::  Yes, Verbal Permission Granted  Name::     [name] [name]   Agency::  [facility]   Relationship::  Daughter   Contact Information:  [phone]  Housing/Transportation Living arrangements for the past 2 months:  [facility] Source of Information:  Adult Children Patient Interpreter Needed:  None Criminal Activity/Legal Involvement Pertinent to Current Situation/Hospitalization:  No - Comment as needed Significant Relationships:  Adult Children (daughter, [name] [name]) Lives with:  Facility Resident Do you feel safe going back to the place where you live?  Yes Need for family participation in patient care:  Yes (Comment)  Care giving concerns:  None expressed by patient's daughter.    Social Worker assessment / plan: CSW intern spoke with daughter by phone regarding discharging planning.  Ms. Eulah Pont confirmed that that patient will return to [facility] at discharge.    Employment status:  Retired Health and safety inspector:  Armed forces operational officer, Medicaid In [place] PT Recommendations:    Information / Referral to community resources:  Other (Comment Required) (None requested or needed at this time. )  Patient/Family's Response to care:  No concerns noted regarding care during this hospitalization.  Patient/Family's Understanding of and Emotional Response to Diagnosis, Current Treatment, and Prognosis:  Not discussed.  Emotional Assessment Appearance:  Appears stated age Attitude/Demeanor/Rapport:  Unable to Assess Affect (typically observed):   Unable to Assess Orientation:  Oriented to Self, Oriented to Place Alcohol / Substance use:  Never Used Psych involvement (Current and /or in the community):  No (Comment)  Discharge Needs  Concerns to be addressed:  Discharge Planning Concerns Readmission within the last 30 days:  No Current discharge risk:  None Barriers to Discharge:  No Barriers Identified   Baldo Daub, Student-SW 04/12/2015, 3:30 PM

## 2015-04-12 NOTE — Evaluation (Signed)
Physical Therapy Evaluation Patient Details Name: [name] [name] MRN: [id number] DOB: Sep 13, 1935 Today's Date: 04/12/2015   History of Present Illness  Patient is a 79 yo female admitted 04/10/15 from ALF with weakness, UTI, AKI.  PMH:  advanced dementia, depression, anxiety, HTN  Clinical Impression  Patient presents with problems listed below.  Will benefit from acute PT to maximize functional mobility prior to return to ALF.  Patient will need assist with ADL's/safety at ALF due to decreased cognition.  Recommend f/u HHPT at ALF.    Follow Up Recommendations Home health PT;Supervision/Assistance - 24 hour    Equipment Recommendations  None recommended by PT    Recommendations for Other Services       Precautions / Restrictions Precautions Precautions: Fall Restrictions Weight Bearing Restrictions: No      Mobility  Bed Mobility Overal bed mobility: Needs Assistance Bed Mobility: Rolling Rolling: Min assist         General bed mobility comments: Patient able to follow commands to move all 4 extremities against gravity.  Was able to roll in bed with hand-over-hand initiation.  Patient distracted internally - trying to find "mommy".  Transfers                 General transfer comment: Limited due to level of anxiety.  Ambulation/Gait                Stairs            Wheelchair Mobility    Modified Rankin (Stroke Patients Only)       Balance                                             Pertinent Vitals/Pain Pain Assessment: No/denies pain    Home Living Family/patient expects to be discharged to:: Assisted living (Patient from ALF [facility]))                      Prior Function Level of Independence: Needs assistance         Comments: Unsure of functional level - anticipate needs assist for mobility/ADL's.  Patient unable to provide information.     Hand Dominance        Extremity/Trunk  Assessment   Upper Extremity Assessment: Generalized weakness           Lower Extremity Assessment: Generalized weakness         Communication   Communication: No difficulties  Cognition Arousal/Alertness: Awake/alert Behavior During Therapy: Agitated;Anxious Overall Cognitive Status: History of cognitive impairments - at baseline (Assuming baseline) Area of Impairment: Orientation;Attention;Memory;Safety/judgement;Awareness;Problem solving Orientation Level: Disoriented to;Place;Time;Situation Current Attention Level: Focused Memory: Decreased short-term memory   Safety/Judgement: Decreased awareness of safety   Problem Solving: Slow processing;Difficulty sequencing;Requires verbal cues General Comments: Patient focused on "I want to see my mommy"  "I was supposed to go have breakfast with my mommy."    General Comments      Exercises        Assessment/Plan    PT Assessment Patient needs continued PT services  PT Diagnosis Difficulty walking;Generalized weakness;Altered mental status   PT Problem List Decreased strength;Decreased activity tolerance;Decreased balance;Decreased mobility;Decreased cognition;Decreased safety awareness;Decreased knowledge of use of DME  PT Treatment Interventions DME instruction;Gait training;Functional mobility training;Therapeutic activities;Patient/family education   PT Goals (Current goals can be found in the Care Plan section) Acute  Rehab PT Goals Patient Stated Goal: Unable to state PT Goal Formulation: Patient unable to participate in goal setting Time For Goal Achievement: 04/26/15 Potential to Achieve Goals: Fair    Frequency Min 2X/week   Barriers to discharge        Co-evaluation               End of Session   Activity Tolerance: Treatment limited secondary to agitation Patient left: in bed;with call bell/phone within reach;with bed alarm set           Time: 1478-2956 PT Time Calculation (min) (ACUTE  ONLY): 12 min   Charges:   PT Evaluation $Initial PT Evaluation Tier I: 1 Procedure     PT G CodesDaily, Doe 04-30-15, 6:24 PM Durenda Hurt. Renaldo Fiddler, [facility] Acute Rehab Services Pager [phone]

## 2015-04-12 NOTE — Progress Notes (Signed)
Patient Demographics  [name] [name], is a 79 y.o. female, DOB - 1936/07/24, ZOX:[id number]  Admit date - 04/10/2015   Admitting Physician Alberteen Sam, MD  Outpatient Primary MD for the patient is [name], MD  LOS - 1   Chief Complaint  Patient presents with  . Weakness         Subjective:   [name] [name] today has, No headache, No chest pain, No abdominal pain - No Nausea,   Assessment & Plan    Principal Problem:   Dementia with behavioral disturbance Active Problems:   Dehydration   UTI (lower urinary tract infection)   Hyponatremia   Hypokalemia   Acute kidney injury   Renal insufficiency  Acute renal failure - Prerenal azotemia from dehydration - Continue to hold ARB and NSAIDs(meloxicam) -Resolved with IV fluids, creatinine today is 0.7  UTI -  received 1 dose of IV Rocephin in ED ,   Treated with oral fosfomycin . - Urine culture growing Escherichia coli .  Hyponatremia - Secondary to volume depletion, resolved with IV fluid  Hypokalemia/hypomagnesemia  - potassium 2.6 today , magnesium 1.2, repleted, recheck in a.m.   QTc prolongation  -  not on any QTc prolonging medication , follow on repeat EKG  .   depression - Patient appears to be severely depressed, is tearful this morning, on lurasidone, psychiatry consulted  Chronic medical issues: Continue duloxetine and lurasidone for depression Continue clonazepam for anxiety Continue rivastigmine for dementia Continue topical cyclosporine for dry eyes Continue PPI  Code Status: Full   Family Communication: none at bedside, left daughter a message   Disposition: In 24 hours if remains stable , pending PT evaluation .   Procedures  none   Consults   none   Medications  Scheduled Meds: . clonazePAM  1 mg Oral BID  . cycloSPORINE  1 drop Both Eyes BID  . docusate sodium  100 mg Oral  BID  . DULoxetine  60 mg Oral BID  . enoxaparin (LOVENOX) injection  40 mg Subcutaneous Q24H  . lurasidone  40 mg Oral Q lunch  . magnesium sulfate 1 - 4 g bolus IVPB  2 g Intravenous Once  . pantoprazole  40 mg Oral Daily  . rivastigmine  1.5 mg Oral BID   Continuous Infusions:   PRN Meds:.  DVT Prophylaxis  Lovenox -  Lab Results  Component Value Date   PLT 203 04/12/2015   Antibiotics   Anti-infectives    Start     Dose/Rate Route Frequency Ordered Stop   04/11/15 0100  cefTRIAXone (ROCEPHIN) 1 g in dextrose 5 % 50 mL IVPB     1 g 100 mL/hr over 30 Minutes Intravenous  Once 04/11/15 0053 04/11/15 0148          Objective:   Filed Vitals:   04/11/15 1744 04/11/15 2014 04/12/15 0453 04/12/15 1015  BP: 132/55 129/51 124/55 122/72  Pulse: 80 84 82 89  Temp: 97.9 F (36.6 C) 98.3 F (36.8 C) 97.8 F (36.6 C) 97.6 F (36.4 C)  TempSrc: Oral Oral Oral Oral  Resp: 18 18 19 20   Weight:  74.1 kg (163 lb 5.8 oz)    SpO2: 98% 98% 96% 97%  Wt Readings from Last 3 Encounters:  04/11/15 74.1 kg (163 lb 5.8 oz)  01/24/15 74.526 kg (164 lb 4.8 oz)     Intake/Output Summary (Last 24 hours) at 04/12/15 1356 Last data filed at 04/12/15 1148  Gross per 24 hour  Intake   3100 ml  Output    500 ml  Net   2600 ml     Physical Exam  Awake , more coherent today , mood appears depressed. [place].AT,PERRAL Supple Neck,No JVD,  Symmetrical Chest wall movement, Good air movement bilaterally, RRR,No Gallops,Rubs or new Murmurs,  +ve B.Sounds, Abd Soft, No tenderness, No organomegaly appriciated, No rebound - guarding or rigidity. No Cyanosis, Clubbing or edema, No new Rash or bruise    Data Review   Micro Results Recent Results (from the past 240 hour(s))  Urine culture     Status: None (Preliminary result)   Collection Time: 04/10/15 11:10 PM  Result Value Ref Range Status   Specimen Description URINE, RANDOM  Final   Special Requests NONE  Final   Culture  >=100,000 COLONIES/mL ESCHERICHIA COLI  Final   Report Status PENDING  Incomplete  MRSA PCR Screening     Status: None   Collection Time: 04/11/15  4:00 AM  Result Value Ref Range Status   MRSA by PCR NEGATIVE NEGATIVE Final    Comment:        The GeneXpert MRSA Assay (FDA approved for NASAL specimens only), is one component of a comprehensive MRSA colonization surveillance program. It is not intended to diagnose MRSA infection nor to guide or monitor treatment for MRSA infections.     Radiology Reports Ct Abdomen Pelvis Wo Contrast  04/11/2015   CLINICAL DATA:  79 year old female with abdominal pain  EXAM: CT ABDOMEN AND PELVIS WITHOUT CONTRAST  TECHNIQUE: Multidetector CT imaging of the abdomen and pelvis was performed following the standard protocol without IV contrast.  COMPARISON:  CT dated 09/21/2010  FINDINGS: Evaluation of this exam is limited in the absence of intravenous contrast. Evaluation is also limited due to streak artifact caused by patient's arms.  Mild emphysematous changes of the visualized lung bases. Bibasilar dependent atelectatic changes noted. No intra-abdominal free air or free fluid.  Cholecystectomy. The liver, pancreas, spleen, adrenal glands appear unremarkable. Small 2-3 mm nonobstructing right renal calculi. There is a stable 1 cm left renal upper pole hypodense lesion containing fat density most compatible with an angiomyolipoma. There is no hydronephrosis on either side. The visualized ureters and urinary bladder appear unremarkable. Hysterectomy.  Sigmoid diverticulosis without active inflammation. No evidence of bowel obstruction or inflammation. Normal appendix.  There is aortoiliac atherosclerotic disease. No portal venous gas. There is no lymphadenopathy. The abdominal wall soft tissues appear unremarkable. There is degenerative changes of the spine. No acute fractures.  IMPRESSION: No acute intra-abdominal or pelvic pathology.  No hydronephrosis or  obstructing stone. Small nonobstructing right renal calculi and a stable fat containing left renal upper pole lesion most compatible with an angiomyolipoma.  Diverticulosis.  No evidence of bowel obstruction or inflammation.   Electronically Signed   By: Elgie Collard M.D.   On: 04/11/2015 01:20   Dg Chest 2 View  03/16/2015   CLINICAL DATA:  Mental status changes today.  EXAM: CHEST  2 VIEW  COMPARISON:  01/24/2015.  FINDINGS: The cardiac silhouette, mediastinal and hilar contours are within normal limits and stable. The lungs are clear. Chronic bronchitic changes and upper lobe scarring changes. No pleural effusion. The bony thorax is  intact.  IMPRESSION: Chronic lung changes but no acute pulmonary findings.   Electronically Signed   By: Rudie Meyer M.D.   On: 03/16/2015 12:02   Ct Head Wo Contrast  04/11/2015   CLINICAL DATA:  Weakness for 3 days. Headache. Stated history of fall.  EXAM: CT HEAD WITHOUT CONTRAST  CT CERVICAL SPINE WITHOUT CONTRAST  TECHNIQUE: Multidetector CT imaging of the head and cervical spine was performed following the standard protocol without intravenous contrast. Multiplanar CT image reconstructions of the cervical spine were also generated.  COMPARISON:  Head CT 03/16/2015  FINDINGS: CT HEAD FINDINGS  Unchanged atrophy and chronic small vessel ischemia.No intracranial hemorrhage, mass effect, or midline shift. No hydrocephalus. The basilar cisterns are patent. No evidence of territorial infarct. No intracranial fluid collection. Calvarium is intact. Included paranasal sinuses and mastoid air cells are well aerated.  CT CERVICAL SPINE FINDINGS  There is no fracture or acute subluxation. The dens is intact. There are no jumped or perched facets. Disc space narrowing and endplate spurring at C4-C5, C5-C6, and C6-C7. Scattered facet arthropathy. No prevertebral soft tissue edema.  IMPRESSION: 1.  No acute intracranial abnormality. 2. No acute bony abnormality of the cervical  spine. Mild multilevel degenerative change.   Electronically Signed   By: Rubye Oaks M.D.   On: 04/11/2015 01:15   Ct Head Wo Contrast  03/16/2015   CLINICAL DATA:  Altered mental status, history of dementia  EXAM: CT HEAD WITHOUT CONTRAST  TECHNIQUE: Contiguous axial images were obtained from the base of the skull through the vertex without intravenous contrast.  COMPARISON:  01/05/2014  FINDINGS: No skull fracture is noted. Paranasal sinuses and mastoid air cells are unremarkable. Mild cerebral atrophy. Mild periventricular white matter decreased attenuation consistent with chronic small vessel ischemic changes. No definite acute cortical infarction. No intracranial hemorrhage, mass effect or midline shift. No mass lesion is noted on this unenhanced scan.  IMPRESSION: No acute intracranial abnormality. Mild cerebral atrophy. Mild periventricular white matter decreased attenuation consistent with chronic small vessel ischemic changes.   Electronically Signed   By: Natasha Mead M.D.   On: 03/16/2015 11:26   Ct Cervical Spine Wo Contrast  04/11/2015   CLINICAL DATA:  Weakness for 3 days. Headache. Stated history of fall.  EXAM: CT HEAD WITHOUT CONTRAST  CT CERVICAL SPINE WITHOUT CONTRAST  TECHNIQUE: Multidetector CT imaging of the head and cervical spine was performed following the standard protocol without intravenous contrast. Multiplanar CT image reconstructions of the cervical spine were also generated.  COMPARISON:  Head CT 03/16/2015  FINDINGS: CT HEAD FINDINGS  Unchanged atrophy and chronic small vessel ischemia.No intracranial hemorrhage, mass effect, or midline shift. No hydrocephalus. The basilar cisterns are patent. No evidence of territorial infarct. No intracranial fluid collection. Calvarium is intact. Included paranasal sinuses and mastoid air cells are well aerated.  CT CERVICAL SPINE FINDINGS  There is no fracture or acute subluxation. The dens is intact. There are no jumped or perched  facets. Disc space narrowing and endplate spurring at C4-C5, C5-C6, and C6-C7. Scattered facet arthropathy. No prevertebral soft tissue edema.  IMPRESSION: 1.  No acute intracranial abnormality. 2. No acute bony abnormality of the cervical spine. Mild multilevel degenerative change.   Electronically Signed   By: Rubye Oaks M.D.   On: 04/11/2015 01:15     CBC  Recent Labs Lab 04/10/15 2101 04/11/15 0400 04/12/15 0440  WBC 6.3 9.6 5.4  HGB 13.2 13.4 11.3*  HCT 38.3 38.3 33.2*  PLT 225 PLATELET CLUMPS NOTED ON SMEAR, COUNT APPEARS ADEQUATE 203  MCV 90.8 90.5 89.7  MCH 31.3 31.7 30.5  MCHC 34.5 35.0 34.0  RDW 12.6 12.8 12.4    Chemistries   Recent Labs Lab 04/10/15 2101 04/11/15 0400 04/12/15 0440  NA 134* 135 136  K 3.0* 3.8 2.6*  CL 97* 99* 103  CO2 21* 21* 25  GLUCOSE 136* 100* 103*  BUN 24* 21* 8  CREATININE 2.01* 1.63* 0.73  CALCIUM 10.4* 9.8 9.0  MG  --   --  1.2*  AST 23  --   --   ALT 22  --   --   ALKPHOS 81  --   --   BILITOT 1.0  --   --    ------------------------------------------------------------------------------------------------------------------ estimated creatinine clearance is 58.1 mL/min (by C-G formula based on Cr of 0.73). ------------------------------------------------------------------------------------------------------------------ No results for input(s): HGBA1C in the last 72 hours. ------------------------------------------------------------------------------------------------------------------ No results for input(s): CHOL, HDL, LDLCALC, TRIG, CHOLHDL, LDLDIRECT in the last 72 hours. ------------------------------------------------------------------------------------------------------------------ No results for input(s): TSH, T4TOTAL, T3FREE, THYROIDAB in the last 72 hours.  Invalid input(s): FREET3 ------------------------------------------------------------------------------------------------------------------ No results for  input(s): VITAMINB12, FOLATE, FERRITIN, TIBC, IRON, RETICCTPCT in the last 72 hours.  Coagulation profile No results for input(s): INR, PROTIME in the last 168 hours.  No results for input(s): DDIMER in the last 72 hours.  Cardiac Enzymes No results for input(s): CKMB, TROPONINI, MYOGLOBIN in the last 168 hours.  Invalid input(s): CK ------------------------------------------------------------------------------------------------------------------ Invalid input(s): POCBNP     Time Spent in minutes   30 minutes   ELGERGAWY, DAWOOD M.D on 04/12/2015 at 1:56 PM  Between 7am to 7pm - Pager - [phone]  After 7pm go to [email or web address] - password [facility]  [facility]   Office  [phone]

## 2015-04-12 NOTE — Progress Notes (Signed)
Utilization review completed. Camary Sosa, RN, BSN. 

## 2015-04-12 NOTE — Clinical Social Work Note (Signed)
CSW reviewed CSW Intern's 9/15 assessment note and have approved as written.  Genelle Bal, MSW, LCSW Licensed Clinical Social Worker Clinical Social Work Department [employer] [phone]

## 2015-04-12 NOTE — Progress Notes (Signed)
Speech Language Pathology Treatment: Dysphagia  Patient Details Name: [name] [name] MRN: [id number] DOB: Nov 14, 1935 Today's Date: 04/12/2015 Time: 7829-5621 SLP Time Calculation (min) (ACUTE ONLY): 10 min  Assessment / Plan / Recommendation Clinical Impression  Pt requires coaxing and encouragement this afternoon for PO intake, although with brief skilled observation she appears to tolerate thin liquids well. She is impulsive with her solid intake, making it difficult for her to adequately orally prepare boluses. Suspect oral phase remains impacted by her current mentation. Would continue with Dys 2 textures and thin liquids at this time.   HPI Other Pertinent Information: [name] [name] is a 79 y.o. female with a PMH significant for depression, advanced dementia, HTN, and anxiety who presented to the ED on 04/10/15 after complaints of becoming progressively smore sleep and weak for four days prior to admission, per SNF staff. CT head indicated no acute intracranial abnormality, Pt's current complaints are acute kidney injury and weakness. Pt's diet is currently regular solid, thin liquid diet.    Pertinent Vitals Pain Assessment: No/denies pain  SLP Plan  Continue with current plan of care    Recommendations Diet recommendations: Dysphagia 2 (fine chop);Thin liquid Liquids provided via: Cup;Straw Medication Administration: Crushed with puree Supervision: Patient able to self feed;Full supervision/cueing for compensatory strategies Compensations: Small sips/bites;Check for pocketing;Slow rate;Minimize environmental distractions Postural Changes and/or Swallow Maneuvers: Seated upright 90 degrees       Oral Care Recommendations: Oral care BID Follow up Recommendations: Skilled Nursing facility Plan: Continue with current plan of care    Maxcine Ham, M.A. CCC-SLP [phone]  Maxcine Ham 04/12/2015, 4:05 PM

## 2015-04-12 NOTE — Progress Notes (Signed)
Pt. Daughter, Trinidad Curet, inquiring about phone call received from Dr.Jonnalagadda and changes made to patient's medication regimen. Spoke with daughter at length regarding his note and the rationale for each medication change. Bonita Quin verbalized understanding but still with some other pertinent questions for Dr.Jonnalagadda. Spoke with patient's nurse, Inetta Fermo, to inform her of daughter's request. Will report off to am Charge Nurse for follow-up. Dondra Spry, RN

## 2015-04-12 NOTE — Consult Note (Signed)
[facility] Face-to-Face Psychiatry Consult   Reason for Consult:  Dementia and severe depression Referring Physician:  Dr. Spero Curb Patient Identification: [name] [name] MRN:  [id number] Principal Diagnosis: Dementia with behavioral disturbance Diagnosis:   Patient Active Problem List   Diagnosis Date Noted  . Acute kidney injury [N17.9] 04/11/2015  . Renal insufficiency [N28.9] 04/11/2015  . Delirium [R41.0] 03/17/2015  . Polypharmacy [Z79.899] 01/27/2015  . Dementia with behavioral disturbance [F03.91] 01/26/2015  . Hypokalemia [E87.6] 01/26/2015  . Pressure ulcer [L89.90] 01/25/2015  . Dehydration [E86.0] 01/24/2015  . UTI (lower urinary tract infection) [N39.0] 01/24/2015  . Metabolic encephalopathy [U38.45] 01/24/2015  . Hyponatremia [E87.1] 01/24/2015    Total Time spent with patient: 1 hour  Subjective:   [name] [name] is a 79 y.o. female patient admitted with sedation.  HPI: [name] [name] is a 79 y.o. female , resident of [place] home admitted to [facility] with increased sedation. Psychiatric consultation requested for increased symptoms of depression, anxiety, behavior problems like screaming and yelling constantly. Patient appeared awake, alert but not oriented and also seems to be confused and repeatedly flip and flap for answers. Patient stated that she wanted to talk to physician who can help to control her depression, anxiety, nervousness, forgetful, being afraid the time. Patient daughter was not at bedside during this evaluation because she has been working and consequent after-hours to the hospital. Patient with a past medical history significant for depression, advanced dementia, hypertension, and anxiety who presents with weakness and new AKI. Per nursing home staff, the patient had been progressively more sleepy and weak since four days ago and it is not clear when her last medication management was and what changes was made at that  time.She was given ceftriaxone for presumed UTI, K for hypokalemia, IV fluids for dehydration, and was noted to have an acute renal injury. Patient has been taking lurasidone, Cymbalta, clonazepam and rivastigmine at the time admission to the hospital. [facility] ask psychiatric social service to contact patient daughter to get the collateral information and possible recent medication changes made at nursing home.   HPI Elements:   Location:  Depression, anxiety, forgetfulness and scaphoid. Quality:  Poor. Severity:  Chronic versus acute. Timing:  Unknown and increased sedation and confusion. Duration:  Few days. Context:  Out-of-home placement and questionable medication management effectiveness.  Past Medical History:  Past Medical History  Diagnosis Date  . Dementia   . Anxiety   . MDD (major depressive disorder)   . Hypertension   . Head ache   . Tremor   . Osteoarthritis     Past Surgical History  Procedure Laterality Date  . Abdominal hysterectomy    . Hemorroidectomy    . Pterygium excision    . Foot surgery      right   Family History: History reviewed. No pertinent family history. Social History:  History  Alcohol Use No     History  Drug Use No    Social History   Social History  . Marital Status: Widowed    Spouse Name: N/A  . Number of Children: N/A  . Years of Education: N/A   Social History Main Topics  . Smoking status: Current Every Day Smoker    Start date: 01/24/2015  . Smokeless tobacco: None  . Alcohol Use: No  . Drug Use: No  . Sexual Activity: Not Asked   Other Topics Concern  . None   Social History Narrative   Additional Social History:  Allergies:   Allergies  Allergen Reactions  . Abilify [Aripiprazole] Other (See Comments)    Caused pt to not be able to walk and was unable to care for herself     Labs:  Results for orders placed or performed during the hospital encounter of 04/10/15 (from  the past 48 hour(s))  CBG monitoring, ED     Status: Abnormal   Collection Time: 04/10/15  8:48 PM  Result Value Ref Range   Glucose-Capillary 159 (H) 65 - 99 mg/dL  Basic metabolic panel     Status: Abnormal   Collection Time: 04/10/15  9:01 PM  Result Value Ref Range   Sodium 134 (L) 135 - 145 mmol/L   Potassium 3.0 (L) 3.5 - 5.1 mmol/L   Chloride 97 (L) 101 - 111 mmol/L   CO2 21 (L) 22 - 32 mmol/L   Glucose, Bld 136 (H) 65 - 99 mg/dL   BUN 24 (H) 6 - 20 mg/dL   Creatinine, Ser 2.01 (H) 0.44 - 1.00 mg/dL   Calcium 10.4 (H) 8.9 - 10.3 mg/dL   GFR calc non Af Amer 23 (L) >60 mL/min   GFR calc Af Amer 26 (L) >60 mL/min    Comment: (NOTE) The eGFR has been calculated using the CKD EPI equation. This calculation has not been validated in all clinical situations. eGFR's persistently <60 mL/min signify possible Chronic Kidney Disease.    Anion gap 16 (H) 5 - 15  CBC     Status: None   Collection Time: 04/10/15  9:01 PM  Result Value Ref Range   WBC 6.3 4.0 - 10.5 K/uL   RBC 4.22 3.87 - 5.11 MIL/uL   Hemoglobin 13.2 12.0 - 15.0 g/dL   HCT 38.3 36.0 - 46.0 %   MCV 90.8 78.0 - 100.0 fL   MCH 31.3 26.0 - 34.0 pg   MCHC 34.5 30.0 - 36.0 g/dL   RDW 12.6 11.5 - 15.5 %   Platelets 225 150 - 400 K/uL  Lipase, blood     Status: None   Collection Time: 04/10/15  9:01 PM  Result Value Ref Range   Lipase 27 22 - 51 U/L  Hepatic function panel     Status: None   Collection Time: 04/10/15  9:01 PM  Result Value Ref Range   Total Protein 6.6 6.5 - 8.1 g/dL   Albumin 3.5 3.5 - 5.0 g/dL   AST 23 15 - 41 U/L   ALT 22 14 - 54 U/L   Alkaline Phosphatase 81 38 - 126 U/L   Total Bilirubin 1.0 0.3 - 1.2 mg/dL   Bilirubin, Direct 0.3 0.1 - 0.5 mg/dL   Indirect Bilirubin 0.7 0.3 - 0.9 mg/dL  Urinalysis, Routine w reflex microscopic (not at [facility])     Status: Abnormal   Collection Time: 04/10/15 11:10 PM  Result Value Ref Range   Color, Urine AMBER (A) YELLOW    Comment: BIOCHEMICALS MAY BE  AFFECTED BY COLOR   APPearance CLOUDY (A) CLEAR   Specific Gravity, Urine 1.015 1.005 - 1.030   pH 5.5 5.0 - 8.0   Glucose, UA NEGATIVE NEGATIVE mg/dL   Hgb urine dipstick MODERATE (A) NEGATIVE   Bilirubin Urine MODERATE (A) NEGATIVE   Ketones, ur NEGATIVE NEGATIVE mg/dL   Protein, ur 30 (A) NEGATIVE mg/dL   Urobilinogen, UA 1.0 0.0 - 1.0 mg/dL   Nitrite NEGATIVE NEGATIVE   Leukocytes, UA MODERATE (A) NEGATIVE  Urine microscopic-add on     Status: Abnormal  Collection Time: 04/10/15 11:10 PM  Result Value Ref Range   Squamous Epithelial / LPF RARE RARE   WBC, UA 11-20 <3 WBC/hpf   RBC / HPF 7-10 <3 RBC/hpf   Bacteria, UA MANY (A) RARE   Casts HYALINE CASTS (A) NEGATIVE  Sodium, urine, random     Status: None   Collection Time: 04/10/15 11:11 PM  Result Value Ref Range   Sodium, Ur 67 mmol/L  Creatinine, urine, random     Status: None   Collection Time: 04/10/15 11:11 PM  Result Value Ref Range   Creatinine, Urine 195.97 mg/dL  Osmolality     Status: None   Collection Time: 04/11/15  4:00 AM  Result Value Ref Range   Osmolality 286 275 - 300 mOsm/kg    Comment: Performed at [facility]  CBC     Status: None   Collection Time: 04/11/15  4:00 AM  Result Value Ref Range   WBC 9.6 4.0 - 10.5 K/uL    Comment: REPEATED TO VERIFY WHITE COUNT CONFIRMED ON SMEAR    RBC 4.23 3.87 - 5.11 MIL/uL   Hemoglobin 13.4 12.0 - 15.0 g/dL   HCT 38.3 36.0 - 46.0 %   MCV 90.5 78.0 - 100.0 fL   MCH 31.7 26.0 - 34.0 pg   MCHC 35.0 30.0 - 36.0 g/dL   RDW 12.8 11.5 - 15.5 %   Platelets  150 - 400 K/uL    PLATELET CLUMPS NOTED ON SMEAR, COUNT APPEARS ADEQUATE  Basic metabolic panel     Status: Abnormal   Collection Time: 04/11/15  4:00 AM  Result Value Ref Range   Sodium 135 135 - 145 mmol/L   Potassium 3.8 3.5 - 5.1 mmol/L    Comment: DELTA CHECK NOTED   Chloride 99 (L) 101 - 111 mmol/L   CO2 21 (L) 22 - 32 mmol/L   Glucose, Bld 100 (H) 65 - 99 mg/dL   BUN 21 (H) 6 - 20 mg/dL    Creatinine, Ser 1.63 (H) 0.44 - 1.00 mg/dL   Calcium 9.8 8.9 - 10.3 mg/dL   GFR calc non Af Amer 29 (L) >60 mL/min   GFR calc Af Amer 34 (L) >60 mL/min    Comment: (NOTE) The eGFR has been calculated using the CKD EPI equation. This calculation has not been validated in all clinical situations. eGFR's persistently <60 mL/min signify possible Chronic Kidney Disease.    Anion gap 15 5 - 15  MRSA PCR Screening     Status: None   Collection Time: 04/11/15  4:00 AM  Result Value Ref Range   MRSA by PCR NEGATIVE NEGATIVE    Comment:        The GeneXpert MRSA Assay (FDA approved for NASAL specimens only), is one component of a comprehensive MRSA colonization surveillance program. It is not intended to diagnose MRSA infection nor to guide or monitor treatment for MRSA infections.   CBC     Status: Abnormal   Collection Time: 04/12/15  4:40 AM  Result Value Ref Range   WBC 5.4 4.0 - 10.5 K/uL   RBC 3.70 (L) 3.87 - 5.11 MIL/uL   Hemoglobin 11.3 (L) 12.0 - 15.0 g/dL   HCT 33.2 (L) 36.0 - 46.0 %   MCV 89.7 78.0 - 100.0 fL   MCH 30.5 26.0 - 34.0 pg   MCHC 34.0 30.0 - 36.0 g/dL   RDW 12.4 11.5 - 15.5 %   Platelets 203 150 - 400 K/uL  Basic metabolic panel     Status: Abnormal   Collection Time: 04/12/15  4:40 AM  Result Value Ref Range   Sodium 136 135 - 145 mmol/L   Potassium 2.6 (LL) 3.5 - 5.1 mmol/L    Comment: CRITICAL RESULT CALLED TO, READ BACK BY AND VERIFIED WITH: CASTRO E,RN 04/12/15 0604 WAYK    Chloride 103 101 - 111 mmol/L   CO2 25 22 - 32 mmol/L   Glucose, Bld 103 (H) 65 - 99 mg/dL   BUN 8 6 - 20 mg/dL   Creatinine, Ser 0.73 0.44 - 1.00 mg/dL   Calcium 9.0 8.9 - 10.3 mg/dL   GFR calc non Af Amer >60 >60 mL/min   GFR calc Af Amer >60 >60 mL/min    Comment: (NOTE) The eGFR has been calculated using the CKD EPI equation. This calculation has not been validated in all clinical situations. eGFR's persistently <60 mL/min signify possible Chronic Kidney Disease.     Anion gap 8 5 - 15  Magnesium     Status: Abnormal   Collection Time: 04/12/15  4:40 AM  Result Value Ref Range   Magnesium 1.2 (L) 1.7 - 2.4 mg/dL    Vitals: Blood pressure 122/72, pulse 89, temperature 97.6 F (36.4 C), temperature source Oral, resp. rate 20, weight 74.1 kg (163 lb 5.8 oz), SpO2 97 %.  Risk to Self: Is patient at risk for suicide?: No Risk to Others:   Prior Inpatient Therapy:   Prior Outpatient Therapy:    Current Facility-Administered Medications  Medication Dose Route Frequency Provider Last Rate Last Dose  . acetaminophen (TYLENOL) tablet 650 mg  650 mg Oral Q4H PRN Rhetta Mura Schorr, NP      . clonazePAM (KLONOPIN) tablet 1 mg  1 mg Oral BID Edwin Dada, MD   1 mg at 04/12/15 0830  . cycloSPORINE (RESTASIS) 0.05 % ophthalmic emulsion 1 drop  1 drop Both Eyes BID Edwin Dada, MD   1 drop at 04/12/15 1009  . docusate sodium (COLACE) capsule 100 mg  100 mg Oral BID Edwin Dada, MD   100 mg at 04/12/15 0830  . DULoxetine (CYMBALTA) DR capsule 60 mg  60 mg Oral BID Edwin Dada, MD   60 mg at 04/12/15 0830  . enoxaparin (LOVENOX) injection 40 mg  40 mg Subcutaneous Q24H Edwin Dada, MD   40 mg at 04/12/15 1009  . lurasidone (LATUDA) tablet 40 mg  40 mg Oral Q lunch Edwin Dada, MD   40 mg at 04/12/15 1132  . pantoprazole (PROTONIX) EC tablet 40 mg  40 mg Oral Daily Edwin Dada, MD   40 mg at 04/12/15 0830  . potassium chloride 10 mEq in 100 mL IVPB  10 mEq Intravenous Q1 Hr x 5 Rhetta Mura Schorr, NP   10 mEq at 04/12/15 1132  . rivastigmine (EXELON) capsule 1.5 mg  1.5 mg Oral BID Edwin Dada, MD   1.5 mg at 04/12/15 1010    Musculoskeletal: Strength & Muscle Tone: decreased Gait & Station: unable to stand Patient leans: N/A  Psychiatric Specialty Exam: Physical Exam as per history and physical   ROS confusion, forgetfulness, dehydration, UTI, nervousness, anxiousness,  depression. Patient denies nausea, vomiting, shortness of breath and chest pain No Fever-chills, No Headache, No changes with Vision or hearing, reports vertigo No problems swallowing food or Liquids, No Chest pain, Cough or Shortness of Breath, No Abdominal pain, No Nausea or Vommitting, Bowel movements  are regular, No Blood in stool or Urine, No dysuria, No new skin rashes or bruises, No new joints pains-aches,  No new weakness, tingling, numbness in any extremity, No recent weight gain or loss, No polyuria, polydypsia or polyphagia,   A full 10 point Review of Systems was done, except as stated above, all other Review of Systems were negative.   Blood pressure 122/72, pulse 89, temperature 97.6 F (36.4 C), temperature source Oral, resp. rate 20, weight 74.1 kg (163 lb 5.8 oz), SpO2 97 %.Body mass index is 28.03 kg/(m^2).  General Appearance: Guarded  Eye Contact::  Good  Speech:  Blocked and Clear and Coherent  Volume:  Normal  Mood:  Anxious and Depressed  Affect:  Appropriate, Non-Congruent, Inappropriate and Labile  Thought Process:  Irrelevant and Loose  Orientation:  Other:  Oriented to first name, last name, and name of the hospital only.  Thought Content:  Rumination  Suicidal Thoughts:  No  Homicidal Thoughts:  No  Memory:  Immediate;   Fair Recent;   Poor  Judgement:  Fair  Insight:  Shallow  Psychomotor Activity:  Decreased  Concentration:  Poor  Recall:  Poor  Fund of Knowledge:Fair  Language: Fair  Akathisia:  Negative  Handed:  Right  AIMS (if indicated):     Assets:  Desire for Improvement Financial Resources/Insurance Leisure Time Physical Health Social Support Transportation  ADL's:  Impaired  Cognition: Impaired,  Moderate  Sleep:      Medical Decision Making: Review of Psycho-Social Stressors (1), Review or order clinical lab tests (1), Established Problem, Worsening (2), Review of Last Therapy Session (1), Review or order medicine tests (1),  Review of Medication Regimen & Side Effects (2) and Review of New Medication or Change in Dosage (2)  Treatment Plan Summary: Patient presented with increased symptoms of dementia, depression, anxiety, confusion but no safety concerns. Daily contact with patient to assess and evaluate symptoms and progress in treatment and Medication management  Plan:  Discontinue Lurasidone due to not effective Start risperidone 0.5 mg at bedtime for behavioral problems associated with the dementia Increase clonazepam 1 mg 3 times daily for anxiety Disease Cymbalta 60 mg daily morning for depression Increase Exelon 3 mg twice daily for dementia Patient does not meet criteria for psychiatric inpatient admission. Supportive therapy provided about ongoing stressors. Appreciate psychiatric consultation and follow up as clinically required Please contact [phone] or [phone] if needs further assistance  Disposition: Patient will benefit from out-of-home placement with the outpatient medication management  Nasiah Polinsky,JANARDHAHA R. 04/12/2015 12:02 PM

## 2015-04-13 LAB — BASIC METABOLIC PANEL
ANION GAP: 8 (ref 5–15)
CHLORIDE: 103 mmol/L (ref 101–111)
CO2: 25 mmol/L (ref 22–32)
Calcium: 9.6 mg/dL (ref 8.9–10.3)
Creatinine, Ser: 0.63 mg/dL (ref 0.44–1.00)
GFR calc Af Amer: >60 mL/min (ref >60)
GFR calc non Af Amer: >60 mL/min (ref >60)
GLUCOSE: 109 mg/dL — AB (ref 65–99)
POTASSIUM: 3.5 mmol/L (ref 3.5–5.1)
SODIUM: 136 mmol/L (ref 135–145)

## 2015-04-13 LAB — URINE CULTURE

## 2015-04-13 LAB — OSMOLALITY: Osmolality: 286 mOsm/kg (ref 275–300)

## 2015-04-13 LAB — MAGNESIUM: Magnesium: 1.3 mg/dL — ABNORMAL LOW (ref 1.7–2.4)

## 2015-04-13 MED ORDER — DULOXETINE HCL 60 MG PO CPEP
120.0000 mg | ORAL_CAPSULE | Freq: Every day | ORAL | Status: DC
  Administered 2015-04-14 – 2015-04-20 (×7): 120 mg via ORAL
  Filled 2015-04-13 (×7): qty 2

## 2015-04-13 MED ORDER — POTASSIUM CHLORIDE CRYS ER 20 MEQ PO TBCR
20.0000 meq | EXTENDED_RELEASE_TABLET | Freq: Every day | ORAL | Status: DC
  Administered 2015-04-14 – 2015-04-20 (×7): 20 meq via ORAL
  Filled 2015-04-13 (×8): qty 1

## 2015-04-13 MED ORDER — FOSFOMYCIN TROMETHAMINE 3 G PO PACK
3.0000 g | PACK | Freq: Once | ORAL | Status: AC
  Administered 2015-04-13: 3 g via ORAL
  Filled 2015-04-13: qty 3

## 2015-04-13 MED ORDER — MAGNESIUM SULFATE 2 GM/50ML IV SOLN
2.0000 g | Freq: Once | INTRAVENOUS | Status: AC
  Administered 2015-04-13: 2 g via INTRAVENOUS
  Filled 2015-04-13: qty 50

## 2015-04-13 MED ORDER — MAGNESIUM OXIDE 400 (241.3 MG) MG PO TABS
400.0000 mg | ORAL_TABLET | Freq: Two times a day (BID) | ORAL | Status: DC
  Administered 2015-04-13 – 2015-04-20 (×14): 400 mg via ORAL
  Filled 2015-04-13 (×14): qty 1

## 2015-04-13 MED ORDER — RISPERIDONE 0.5 MG PO TABS
0.5000 mg | ORAL_TABLET | Freq: Two times a day (BID) | ORAL | Status: DC
  Administered 2015-04-13 – 2015-04-14 (×2): 0.5 mg via ORAL
  Filled 2015-04-13 (×4): qty 1

## 2015-04-13 MED ORDER — POTASSIUM CHLORIDE CRYS ER 20 MEQ PO TBCR
40.0000 meq | EXTENDED_RELEASE_TABLET | Freq: Once | ORAL | Status: AC
  Administered 2015-04-13: 40 meq via ORAL
  Filled 2015-04-13: qty 2

## 2015-04-13 NOTE — Clinical Social Work Psych Note (Signed)
Psych CSW received notification from psychiatrist that disposition has changed to: inpatient geripsych placement per patient and daughter's request for crisis and medication stabilization.  Patient has been referred to: [facility], [place], [facility] and [facility].  Psych CSW has requested the assistance from weekend CSW staff for follow-up on placement.  Vickii Penna, LCSW [phone]  Psychiatric & Orthopedics (5N 1-8) Clinical Social Worker

## 2015-04-13 NOTE — Progress Notes (Signed)
Patient awaken from sleep tearful and afraid something had happened to her daughter [name] [name].Attempted to reassure patient that daughter [name] [name] was okay .Daughter is to visit later today.Patient confused insistent that daughter is to be at hospital with her now.Called daughter [name] [name] on phone to talk to patient.

## 2015-04-13 NOTE — Clinical Social Work Psych Note (Addendum)
Patient is from [facility].  Psych CSW was consulted to collect collateral information regarding patient's follow-up for patient.  Patient has been followed by Psych NP at [facility].  Bonita Quin, patient's daughter has received the information requested from psychiatry and delivered this to the unit RN (per daughter).  Bonita Quin remains available for assistance and is a great support for patient.  Psychiatrist updated regarding paperwork.  Bonita Quin expressed concerns of having patient moved from [facility] in [place].  Patient's daughter would like list of facilities in [facility] emailed to her at: [email or web address].  Unit CSW to follow up on SNF request.  Unit CSW updated regarding this request.    Bonita Quin, patient's daughter, states she recently had to take care of her maternal grandmother and feels that she is under a lot of stress attempting to assist her mother with life needs.  Psych CSW offered emotional support for caregiver fatigue.   Vickii Penna, LCSW [phone]  Psychiatric & Orthopedics (5N 1-8) Clinical Social Worker

## 2015-04-13 NOTE — Progress Notes (Signed)
Patient Demographics  [name] [name], is a 79 y.o. female, DOB - [birth year], ZOX:[id number]  Admit date - 04/10/2015   Admitting Physician Alberteen Sam, MD  Outpatient Primary MD for the patient is [name], MD  LOS - 2   Chief Complaint  Patient presents with  . Weakness         Subjective:   [name] today has, No headache, No chest pain, No abdominal pain - No Nausea,   Assessment & Plan    Principal Problem:   Dementia with behavioral disturbance Active Problems:   Dehydration   UTI (lower urinary tract infection)   Hyponatremia   Hypokalemia   Acute kidney injury   Renal insufficiency  Acute renal failure - Prerenal azotemia from dehydration - Continue to hold ARB and NSAIDs(meloxicam) -Resolved with IV fluids, creatinine today is 0.7  UTI -  received 1 dose of IV Rocephin in ED ,   Treated with oral fosfomycin X2.. - Urine culture growing Escherichia coli , patient in the past has urine culture growing enterococcus, and Streptococcus, this was discussed and at length with Dr. Orvan Falconer, very likely this is related to contamination, and there is no indication for long-term antibiotics suppressive therapy.  Hyponatremia - Secondary to volume depletion, resolved with IV fluid  Hypokalemia/hypomagnesemia  -  repleted, recheck in a.m.   QTc prolongation  -  Repeat EKG was done, QTc resolved  .   depression - Patient appears to be severely depressed, has baseline dementia as well, psychiatric consult greatly appreciated, they do recommend inpatient psych admission for crisis stabilization and medication management.  Chronic medical issues: Continue duloxetine and lurasidone for depression Continue clonazepam for anxiety Continue rivastigmine for dementia Continue topical cyclosporine for dry eyes Continue PPI  Code Status: Full   Family  Communication: none at bedside, spoke with Daughter on 9/15   Disposition: for inpatient psych admission   Procedures  none   Consults  Psychiatry   Medications  Scheduled Meds: . clonazePAM  1 mg Oral TID  . cycloSPORINE  1 drop Both Eyes BID  . docusate sodium  100 mg Oral BID  . [START ON 04/14/2015] DULoxetine  120 mg Oral Daily  . enoxaparin (LOVENOX) injection  40 mg Subcutaneous Q24H  . pantoprazole  40 mg Oral Daily  . risperiDONE  0.5 mg Oral BID  . rivastigmine  3 mg Oral BID WC   Continuous Infusions:   PRN Meds:.  DVT Prophylaxis  Lovenox -  Lab Results  Component Value Date   PLT 203 04/12/2015   Antibiotics   Anti-infectives    Start     Dose/Rate Route Frequency Ordered Stop   04/11/15 0100  cefTRIAXone (ROCEPHIN) 1 g in dextrose 5 % 50 mL IVPB     1 g 100 mL/hr over 30 Minutes Intravenous  Once 04/11/15 0053 04/11/15 0148          Objective:   Filed Vitals:   04/12/15 1741 04/12/15 2128 04/13/15 0529 04/13/15 0844  BP: 133/76 127/72 128/76 131/75  Pulse: 84 75 82 73  Temp: 98.2 F (36.8 C) 98.4 F (36.9 C) 97.4 F (36.3 C) 98 F (36.7 C)  TempSrc: Oral Oral Oral Oral  Resp: 20 18 18  17  Weight:      SpO2: 97% 96% 96% 99%    Wt Readings from Last 3 Encounters:  04/11/15 74.1 kg (163 lb 5.8 oz)  01/24/15 74.526 kg (164 lb 4.8 oz)     Intake/Output Summary (Last 24 hours) at 04/13/15 1550 Last data filed at 04/13/15 1345  Gross per 24 hour  Intake    840 ml  Output    250 ml  Net    590 ml     Physical Exam  Awake , mood appears depressed. [place].AT,PERRAL Supple Neck,No JVD,  Symmetrical Chest wall movement, Good air movement bilaterally, RRR,No Gallops,Rubs or new Murmurs,  +ve B.Sounds, Abd Soft, No tenderness, No organomegaly appriciated, No rebound - guarding or rigidity. No Cyanosis, Clubbing or edema, No new Rash or bruise    Data Review   Micro Results Recent Results (from the past 240 hour(s))  Urine  culture     Status: None   Collection Time: 04/10/15 11:10 PM  Result Value Ref Range Status   Specimen Description URINE, RANDOM  Final   Special Requests NONE  Final   Culture   Final    >=100,000 COLONIES/mL ESCHERICHIA COLI Confirmed Extended Spectrum Beta-Lactamase Producer (ESBL)    Report Status 04/13/2015 FINAL  Final   Organism ID, Bacteria ESCHERICHIA COLI  Final      Susceptibility   Escherichia coli - MIC*    AMPICILLIN >=32 RESISTANT Resistant     CEFAZOLIN >=64 RESISTANT Resistant     CEFTRIAXONE >=64 RESISTANT Resistant     CIPROFLOXACIN >=4 RESISTANT Resistant     GENTAMICIN >=16 RESISTANT Resistant     IMIPENEM <=0.25 SENSITIVE Sensitive     NITROFURANTOIN <=16 SENSITIVE Sensitive     TRIMETH/SULFA >=320 RESISTANT Resistant     AMPICILLIN/SULBACTAM >=32 RESISTANT Resistant     PIP/TAZO 8 SENSITIVE Sensitive     * >=100,000 COLONIES/mL ESCHERICHIA COLI  MRSA PCR Screening     Status: None   Collection Time: 04/11/15  4:00 AM  Result Value Ref Range Status   MRSA by PCR NEGATIVE NEGATIVE Final    Comment:        The GeneXpert MRSA Assay (FDA approved for NASAL specimens only), is one component of a comprehensive MRSA colonization surveillance program. It is not intended to diagnose MRSA infection nor to guide or monitor treatment for MRSA infections.     Radiology Reports Ct Abdomen Pelvis Wo Contrast  04/11/2015   CLINICAL DATA:  79 year old female with abdominal pain  EXAM: CT ABDOMEN AND PELVIS WITHOUT CONTRAST  TECHNIQUE: Multidetector CT imaging of the abdomen and pelvis was performed following the standard protocol without IV contrast.  COMPARISON:  CT dated 09/21/2010  FINDINGS: Evaluation of this exam is limited in the absence of intravenous contrast. Evaluation is also limited due to streak artifact caused by patient's arms.  Mild emphysematous changes of the visualized lung bases. Bibasilar dependent atelectatic changes noted. No intra-abdominal  free air or free fluid.  Cholecystectomy. The liver, pancreas, spleen, adrenal glands appear unremarkable. Small 2-3 mm nonobstructing right renal calculi. There is a stable 1 cm left renal upper pole hypodense lesion containing fat density most compatible with an angiomyolipoma. There is no hydronephrosis on either side. The visualized ureters and urinary bladder appear unremarkable. Hysterectomy.  Sigmoid diverticulosis without active inflammation. No evidence of bowel obstruction or inflammation. Normal appendix.  There is aortoiliac atherosclerotic disease. No portal venous gas. There is no lymphadenopathy. The abdominal wall soft tissues  appear unremarkable. There is degenerative changes of the spine. No acute fractures.  IMPRESSION: No acute intra-abdominal or pelvic pathology.  No hydronephrosis or obstructing stone. Small nonobstructing right renal calculi and a stable fat containing left renal upper pole lesion most compatible with an angiomyolipoma.  Diverticulosis.  No evidence of bowel obstruction or inflammation.   Electronically Signed   By: Elgie Collard M.D.   On: 04/11/2015 01:20   Dg Chest 2 View  03/16/2015   CLINICAL DATA:  Mental status changes today.  EXAM: CHEST  2 VIEW  COMPARISON:  01/24/2015.  FINDINGS: The cardiac silhouette, mediastinal and hilar contours are within normal limits and stable. The lungs are clear. Chronic bronchitic changes and upper lobe scarring changes. No pleural effusion. The bony thorax is intact.  IMPRESSION: Chronic lung changes but no acute pulmonary findings.   Electronically Signed   By: Rudie Meyer M.D.   On: 03/16/2015 12:02   Ct Head Wo Contrast  04/11/2015   CLINICAL DATA:  Weakness for 3 days. Headache. Stated history of fall.  EXAM: CT HEAD WITHOUT CONTRAST  CT CERVICAL SPINE WITHOUT CONTRAST  TECHNIQUE: Multidetector CT imaging of the head and cervical spine was performed following the standard protocol without intravenous contrast. Multiplanar  CT image reconstructions of the cervical spine were also generated.  COMPARISON:  Head CT 03/16/2015  FINDINGS: CT HEAD FINDINGS  Unchanged atrophy and chronic small vessel ischemia.No intracranial hemorrhage, mass effect, or midline shift. No hydrocephalus. The basilar cisterns are patent. No evidence of territorial infarct. No intracranial fluid collection. Calvarium is intact. Included paranasal sinuses and mastoid air cells are well aerated.  CT CERVICAL SPINE FINDINGS  There is no fracture or acute subluxation. The dens is intact. There are no jumped or perched facets. Disc space narrowing and endplate spurring at C4-C5, C5-C6, and C6-C7. Scattered facet arthropathy. No prevertebral soft tissue edema.  IMPRESSION: 1.  No acute intracranial abnormality. 2. No acute bony abnormality of the cervical spine. Mild multilevel degenerative change.   Electronically Signed   By: Rubye Oaks M.D.   On: 04/11/2015 01:15   Ct Head Wo Contrast  03/16/2015   CLINICAL DATA:  Altered mental status, history of dementia  EXAM: CT HEAD WITHOUT CONTRAST  TECHNIQUE: Contiguous axial images were obtained from the base of the skull through the vertex without intravenous contrast.  COMPARISON:  01/05/2014  FINDINGS: No skull fracture is noted. Paranasal sinuses and mastoid air cells are unremarkable. Mild cerebral atrophy. Mild periventricular white matter decreased attenuation consistent with chronic small vessel ischemic changes. No definite acute cortical infarction. No intracranial hemorrhage, mass effect or midline shift. No mass lesion is noted on this unenhanced scan.  IMPRESSION: No acute intracranial abnormality. Mild cerebral atrophy. Mild periventricular white matter decreased attenuation consistent with chronic small vessel ischemic changes.   Electronically Signed   By: Natasha Mead M.D.   On: 03/16/2015 11:26   Ct Cervical Spine Wo Contrast  04/11/2015   CLINICAL DATA:  Weakness for 3 days. Headache. Stated  history of fall.  EXAM: CT HEAD WITHOUT CONTRAST  CT CERVICAL SPINE WITHOUT CONTRAST  TECHNIQUE: Multidetector CT imaging of the head and cervical spine was performed following the standard protocol without intravenous contrast. Multiplanar CT image reconstructions of the cervical spine were also generated.  COMPARISON:  Head CT 03/16/2015  FINDINGS: CT HEAD FINDINGS  Unchanged atrophy and chronic small vessel ischemia.No intracranial hemorrhage, mass effect, or midline shift. No hydrocephalus. The basilar cisterns are patent.  No evidence of territorial infarct. No intracranial fluid collection. Calvarium is intact. Included paranasal sinuses and mastoid air cells are well aerated.  CT CERVICAL SPINE FINDINGS  There is no fracture or acute subluxation. The dens is intact. There are no jumped or perched facets. Disc space narrowing and endplate spurring at C4-C5, C5-C6, and C6-C7. Scattered facet arthropathy. No prevertebral soft tissue edema.  IMPRESSION: 1.  No acute intracranial abnormality. 2. No acute bony abnormality of the cervical spine. Mild multilevel degenerative change.   Electronically Signed   By: Rubye Oaks M.D.   On: 04/11/2015 01:15     CBC  Recent Labs Lab 04/10/15 2101 04/11/15 0400 04/12/15 0440  WBC 6.3 9.6 5.4  HGB 13.2 13.4 11.3*  HCT 38.3 38.3 33.2*  PLT 225 PLATELET CLUMPS NOTED ON SMEAR, COUNT APPEARS ADEQUATE 203  MCV 90.8 90.5 89.7  MCH 31.3 31.7 30.5  MCHC 34.5 35.0 34.0  RDW 12.6 12.8 12.4    Chemistries   Recent Labs Lab 04/10/15 2101 04/11/15 0400 04/12/15 0440 04/13/15 0515  NA 134* 135 136 136  K 3.0* 3.8 2.6* 3.5  CL 97* 99* 103 103  CO2 21* 21* 25 25  GLUCOSE 136* 100* 103* 109*  BUN 24* 21* 8 <5*  CREATININE 2.01* 1.63* 0.73 0.63  CALCIUM 10.4* 9.8 9.0 9.6  MG  --   --  1.2* 1.3*  AST 23  --   --   --   ALT 22  --   --   --   ALKPHOS 81  --   --   --   BILITOT 1.0  --   --   --     ------------------------------------------------------------------------------------------------------------------ estimated creatinine clearance is 58.1 mL/min (by C-G formula based on Cr of 0.63). ------------------------------------------------------------------------------------------------------------------ No results for input(s): HGBA1C in the last 72 hours. ------------------------------------------------------------------------------------------------------------------ No results for input(s): CHOL, HDL, LDLCALC, TRIG, CHOLHDL, LDLDIRECT in the last 72 hours. ------------------------------------------------------------------------------------------------------------------ No results for input(s): TSH, T4TOTAL, T3FREE, THYROIDAB in the last 72 hours.  Invalid input(s): FREET3 ------------------------------------------------------------------------------------------------------------------ No results for input(s): VITAMINB12, FOLATE, FERRITIN, TIBC, IRON, RETICCTPCT in the last 72 hours.  Coagulation profile No results for input(s): INR, PROTIME in the last 168 hours.  No results for input(s): DDIMER in the last 72 hours.  Cardiac Enzymes No results for input(s): CKMB, TROPONINI, MYOGLOBIN in the last 168 hours.  Invalid input(s): CK ------------------------------------------------------------------------------------------------------------------ Invalid input(s): POCBNP     Time Spent in minutes   30 minutes   ELGERGAWY, DAWOOD M.D on 04/13/2015 at 3:50 PM  Between 7am to 7pm - Pager - [phone]  After 7pm go to [email or web address] - password [facility]  [facility]   Office  [phone]

## 2015-04-13 NOTE — Progress Notes (Signed)
Physical Therapy Treatment Patient Details Name: Kaiden Dardis MRN: [id number] DOB: 10-Jan-1936 Today's Date: 04/13/2015    History of Present Illness Patient is a 79 yo female admitted 04/10/15 from ALF with weakness, UTI, AKI.  PMH:  advanced dementia, depression, anxiety, HTN    PT Comments    Patient requiring mod assist for mobility/gait.  Returning to ALF - will need f/u HHPT and assist for mobility and ADL's.  Follow Up Recommendations  Home health PT;Supervision/Assistance - 24 hour (HHPT at ALF)     Equipment Recommendations  None recommended by PT    Recommendations for Other Services       Precautions / Restrictions Precautions Precautions: Fall Restrictions Weight Bearing Restrictions: No    Mobility  Bed Mobility Overal bed mobility: Needs Assistance Bed Mobility: Supine to Sit;Sit to Supine     Supine to sit: Min assist Sit to supine: Min assist   General bed mobility comments: Verbal and tactile cues to move to EOB.  Patient required assist using bed pad to scoot to EOB.  Assist to bring LE's onto bed to return to supine.  Transfers Overall transfer level: Needs assistance Equipment used: 1 person hand held assist Transfers: Sit to/from Stand Sit to Stand: Mod assist         General transfer comment: Patient required mod assist to power up to standing from bed and chair.  Remains in flexed posture, holding on to PT's hand with both of her hands.  Ambulation/Gait Ambulation/Gait assistance: Mod assist Ambulation Distance (Feet): 12 Feet (with 1 sitting rest break) Assistive device: 1 person hand held assist Gait Pattern/deviations: Step-through pattern;Decreased step length - right;Decreased step length - left;Decreased stride length;Shuffle;Trunk flexed Gait velocity: decreased Gait velocity interpretation: Below normal speed for age/gender General Gait Details: "I can't do this" as patient ambulated with short shuffling steps.  Maintains flexed  posture during gait.  Requires mod assist for balance.  May want to try RW.   Stairs            Wheelchair Mobility    Modified Rankin (Stroke Patients Only)       Balance Overall balance assessment: Needs assistance         Standing balance support: Bilateral upper extremity supported Standing balance-Leahy Scale: Poor                      Cognition Arousal/Alertness: Awake/alert Behavior During Therapy: Anxious Overall Cognitive Status: History of cognitive impairments - at baseline                 General Comments: Advanced dementia per chart.    Exercises      General Comments        Pertinent Vitals/Pain Pain Assessment: No/denies pain    Home Living                      Prior Function            PT Goals (current goals can now be found in the care plan section) Progress towards PT goals: Progressing toward goals    Frequency  Min 2X/week    PT Plan Current plan remains appropriate    Co-evaluation             End of Session Equipment Utilized During Treatment: Gait belt Activity Tolerance: Patient limited by fatigue (Patient limited by fearfulness/anxiety) Patient left: in bed;with call bell/phone within reach;with bed alarm set     Time:  1610-9604 PT Time Calculation (min) (ACUTE ONLY): 14 min  Charges:  $Gait Training: 8-22 mins                    G Codes:      Callyn, Severtson May 07, 2015, 2:25 PM Durenda Hurt. Renaldo Fiddler, [facility] Pager [phone]

## 2015-04-13 NOTE — Progress Notes (Signed)
Patient's daughter called with the following information:  Psychiatry service provider for the patient's ALF is:  [facility] Main office in [place], [place] [phone]  Provider has been: Drucilla Schmidt, NP  Daughter stated that the patient has not been on Risperdal.  Daughter stated that the provider could be reached through the number above.  Peri Maris, MBA, BS, RN

## 2015-04-13 NOTE — Consult Note (Signed)
[facility] Face-to-Face Psychiatry Consult Follow Up  Reason for Consult:  Dementia and severe depression Referring Physician:  Dr. Spero Curb Patient Identification: [name] [name] MRN:  [id number] Principal Diagnosis: Dementia with behavioral disturbance Diagnosis:   Patient Active Problem List   Diagnosis Date Noted  . Acute kidney injury [N17.9] 04/11/2015  . Renal insufficiency [N28.9] 04/11/2015  . Delirium [R41.0] 03/17/2015  . Polypharmacy [Z79.899] 01/27/2015  . Dementia with behavioral disturbance [F03.91] 01/26/2015  . Hypokalemia [E87.6] 01/26/2015  . Pressure ulcer [L89.90] 01/25/2015  . Dehydration [E86.0] 01/24/2015  . UTI (lower urinary tract infection) [N39.0] 01/24/2015  . Metabolic encephalopathy [T59.74] 01/24/2015  . Hyponatremia [E87.1] 01/24/2015    Total Time spent with patient: 30 minutes  Subjective:   [name] [name] is a 79 y.o. female patient admitted with sedation.  HPI: [name] [name] is a 79 y.o. female , resident of [facility] home admitted to [facility] with increased sedation. Psychiatric consultation requested for increased symptoms of depression, anxiety, behavior problems like screaming and yelling constantly. Patient appeared awake, alert but not oriented and also seems to be confused and repeatedly flip and flap for answers. Patient stated that she wanted to talk to physician who can help to control her depression, anxiety, nervousness, forgetful, being afraid the time. Patient daughter was not at bedside during this evaluation because she has been working and consequent after-hours to the hospital. Patient with a past medical history significant for depression, advanced dementia, hypertension, and anxiety who presents with weakness and new AKI. Patient has been taking lurasidone, Cymbalta, clonazepam and rivastigmine at the time admission to the hospital. [facility] ask psychiatric social service to contact patient daughter to get  the collateral information and possible recent medication changes made at nursing home.   HPI Elements:   Location:  Depression, anxiety, forgetfulness and scaphoid. Quality:  Poor. Severity:  Chronic versus acute. Timing:  Unknown and increased sedation and confusion. Duration:  Few days. Context:  Out-of-home placement and questionable medication management effectiveness.   Interval History: Patient continue to be anxious, depressed and asking to be helped with her situation. When asked about situation she stated that she has been depressed through out her life and feels she has no body that is supportive. Spoke with patient daughter [name] [name], who stated that her mother has been suffering with depression, anxiety and personality disorder through out her life and about three years ago she was diagnosed with mild to moderate dementia. She has been receiving medication management from in house provider from, [place], Main office in [place], [place] [phone]: Provider has been: [name] Pert, NP. I have called the above number and received a noise sounds like fax machine and unable to speak personally. Reportedly she was not on risperidone prior to this visit. She was previously was [facility] and [facility] and was also received ECT x 10 session without clinical response, all of this is more than three years ago.  Patient and her daughter requested to be place in acute psych hospital for better psych medication management at this time. Spoke with psych social service for possible referral to [facility] psych admission.   Past Medical History:  Past Medical History  Diagnosis Date  . Dementia   . Anxiety   . MDD (major depressive disorder)   . Hypertension   . Head ache   . Tremor   . Osteoarthritis     Past Surgical History  Procedure Laterality Date  . Abdominal hysterectomy    .  Hemorroidectomy    . Pterygium excision    . Foot surgery      right   Family History:  History reviewed. No pertinent family history. Social History:  History  Alcohol Use No     History  Drug Use No    Social History   Social History  . Marital Status: Widowed    Spouse Name: N/A  . Number of Children: N/A  . Years of Education: N/A   Social History Main Topics  . Smoking status: Current Every Day Smoker    Start date: 01/24/2015  . Smokeless tobacco: None  . Alcohol Use: No  . Drug Use: No  . Sexual Activity: Not Asked   Other Topics Concern  . None   Social History Narrative   Additional Social History:                          Allergies:   Allergies  Allergen Reactions  . Abilify [Aripiprazole] Other (See Comments)    Caused pt to not be able to walk and was unable to care for herself     Labs:  Results for orders placed or performed during the hospital encounter of 04/10/15 (from the past 48 hour(s))  CBC     Status: Abnormal   Collection Time: 04/12/15  4:40 AM  Result Value Ref Range   WBC 5.4 4.0 - 10.5 K/uL   RBC 3.70 (L) 3.87 - 5.11 MIL/uL   Hemoglobin 11.3 (L) 12.0 - 15.0 g/dL   HCT 33.2 (L) 36.0 - 46.0 %   MCV 89.7 78.0 - 100.0 fL   MCH 30.5 26.0 - 34.0 pg   MCHC 34.0 30.0 - 36.0 g/dL   RDW 12.4 11.5 - 15.5 %   Platelets 203 150 - 400 K/uL  Basic metabolic panel     Status: Abnormal   Collection Time: 04/12/15  4:40 AM  Result Value Ref Range   Sodium 136 135 - 145 mmol/L   Potassium 2.6 (LL) 3.5 - 5.1 mmol/L    Comment: CRITICAL RESULT CALLED TO, READ BACK BY AND VERIFIED WITH: CASTRO E,RN 04/12/15 0604 WAYK    Chloride 103 101 - 111 mmol/L   CO2 25 22 - 32 mmol/L   Glucose, Bld 103 (H) 65 - 99 mg/dL   BUN 8 6 - 20 mg/dL   Creatinine, Ser 0.73 0.44 - 1.00 mg/dL   Calcium 9.0 8.9 - 10.3 mg/dL   GFR calc non Af Amer >60 >60 mL/min   GFR calc Af Amer >60 >60 mL/min    Comment: (NOTE) The eGFR has been calculated using the CKD EPI equation. This calculation has not been validated in all clinical  situations. eGFR's persistently <60 mL/min signify possible Chronic Kidney Disease.    Anion gap 8 5 - 15  Magnesium     Status: Abnormal   Collection Time: 04/12/15  4:40 AM  Result Value Ref Range   Magnesium 1.2 (L) 1.7 - 2.4 mg/dL  Basic metabolic panel     Status: Abnormal   Collection Time: 04/13/15  5:15 AM  Result Value Ref Range   Sodium 136 135 - 145 mmol/L   Potassium 3.5 3.5 - 5.1 mmol/L    Comment: DELTA CHECK NOTED   Chloride 103 101 - 111 mmol/L   CO2 25 22 - 32 mmol/L   Glucose, Bld 109 (H) 65 - 99 mg/dL   BUN <5 (L) 6 - 20 mg/dL  Creatinine, Ser 0.63 0.44 - 1.00 mg/dL   Calcium 9.6 8.9 - 10.3 mg/dL   GFR calc non Af Amer >60 >60 mL/min   GFR calc Af Amer >60 >60 mL/min    Comment: (NOTE) The eGFR has been calculated using the CKD EPI equation. This calculation has not been validated in all clinical situations. eGFR's persistently <60 mL/min signify possible Chronic Kidney Disease.    Anion gap 8 5 - 15  Magnesium     Status: Abnormal   Collection Time: 04/13/15  5:15 AM  Result Value Ref Range   Magnesium 1.3 (L) 1.7 - 2.4 mg/dL    Vitals: Blood pressure 131/75, pulse 73, temperature 98 F (36.7 C), temperature source Oral, resp. rate 17, weight 74.1 kg (163 lb 5.8 oz), SpO2 99 %.  Risk to Self: Is patient at risk for suicide?: No Risk to Others:   Prior Inpatient Therapy:   Prior Outpatient Therapy:    Current Facility-Administered Medications  Medication Dose Route Frequency Provider Last Rate Last Dose  . acetaminophen (TYLENOL) tablet 650 mg  650 mg Oral Q4H PRN Katherine P Schorr, NP      . clonazePAM (KLONOPIN) tablet 1 mg  1 mg Oral TID Janardhana Jonnalagadda, MD   1 mg at 04/13/15 0859  . cycloSPORINE (RESTASIS) 0.05 % ophthalmic emulsion 1 drop  1 drop Both Eyes BID Christopher P Danford, MD   1 drop at 04/13/15 0903  . docusate sodium (COLACE) capsule 100 mg  100 mg Oral BID Christopher P Danford, MD   100 mg at 04/13/15 0858  .  DULoxetine (CYMBALTA) DR capsule 60 mg  60 mg Oral Daily Janardhana Jonnalagadda, MD   60 mg at 04/13/15 0858  . enoxaparin (LOVENOX) injection 40 mg  40 mg Subcutaneous Q24H Christopher P Danford, MD   40 mg at 04/13/15 0859  . fosfomycin (MONUROL) packet 3 g  3 g Oral Once Dawood S Elgergawy, MD      . magnesium sulfate IVPB 2 g 50 mL  2 g Intravenous Once Dawood S Elgergawy, MD   2 g at 04/13/15 0905  . pantoprazole (PROTONIX) EC tablet 40 mg  40 mg Oral Daily Christopher P Danford, MD   40 mg at 04/13/15 0859  . risperiDONE (RISPERDAL) tablet 0.5 mg  0.5 mg Oral QHS Janardhana Jonnalagadda, MD   0.5 mg at 04/12/15 2248  . rivastigmine (EXELON) capsule 3 mg  3 mg Oral BID WC Janardhana Jonnalagadda, MD   3 mg at 04/13/15 0859    Musculoskeletal: Strength & Muscle Tone: decreased Gait & Station: unable to stand Patient leans: N/A  Psychiatric Specialty Exam: Physical Exam as per history and physical   ROS confusion, forgetfulness, dehydration, UTI, nervousness, anxiousness, depression. Patient denies nausea, vomiting, shortness of breath and chest pain   Blood pressure 131/75, pulse 73, temperature 98 F (36.7 C), temperature source Oral, resp. rate 17, weight 74.1 kg (163 lb 5.8 oz), SpO2 99 %.Body mass index is 28.03 kg/(m^2).  General Appearance: Guarded  Eye Contact::  Good  Speech:  Blocked and Clear and Coherent  Volume:  Normal  Mood:  Anxious and Depressed  Affect:  Appropriate, Non-Congruent, Inappropriate and Labile  Thought Process:  Irrelevant and Loose  Orientation:  Other:  Oriented to first name, last name, and name of the hospital only.  Thought Content:  Rumination  Suicidal Thoughts:  No  Homicidal Thoughts:  No  Memory:  Immediate;   Fair Recent;   Poor    Judgement:  Fair  Insight:  Shallow  Psychomotor Activity:  Decreased  Concentration:  Poor  Recall:  Poor  Fund of Knowledge:Fair  Language: Fair  Akathisia:  Negative  Handed:  Right  AIMS (if  indicated):     Assets:  Desire for Improvement Financial Resources/Insurance Leisure Time Physical Health Social Support Transportation  ADL's:  Impaired  Cognition: Impaired,  Moderate  Sleep:      Medical Decision Making: Review of Psycho-Social Stressors (1), Review or order clinical lab tests (1), Established Problem, Worsening (2), Review of Last Therapy Session (1), Review or order medicine tests (1), Review of Medication Regimen & Side Effects (2) and Review of New Medication or Change in Dosage (2)  Treatment Plan Summary: Patient with increased symptoms of dementia, depression, anxiety, disturbed sleep and appetite but no active suicide or homicide ideation. She was not able to care for herself.  Daily contact with patient to assess and evaluate symptoms and progress in treatment and Medication management  Plan:  Patient has no safety concerns except she can not care for herself without assistance Discontinue Lurasidone due to not effective Increase risperidone 0.5 mg BID - behavioral problems associated with the dementia Continue clonazepam 1 mg 3 times daily for anxiety Increase Cymbalta 120 mg daily morning for depression Continue Exelon 3 mg twice daily for dementia Recommend psychiatric Inpatient admission when medically cleared. Supportive therapy provided about ongoing stressors. Appreciate psychiatric consultation and follow up as clinically required Please contact [phone] or [phone] if needs further assistance  Disposition: Patient will be referred to geriatric psych admission for crisis stabilization and medication management when medically stable. Patient is receiving in house mental health provider at [facility]F - [facility], Main office in [place], [place], [phone]. Provider has been: [name] Brown, NP  JONNALAGADDA,JANARDHAHA R. 04/13/2015 9:33 AM  

## 2015-04-13 NOTE — Care Management Note (Signed)
Case Management Note  Patient Details  Name: [name] [name] MRN: [id number] Date of Birth: 04/09/1936  Subjective/Objective:             CM following for progression and d/c planning, pt is resident of [facility] an ALF, however now the recommendation is that she be d/c to a Psy facility for acute treatment. Psy CSW has begun search for a Psy faciltiy.       Action/Plan: No CM needs, will follow for progression with Psy facility placement.   Expected Discharge Date:  04/16/2015               Expected Discharge Plan:  Psychiatric Hospital  In-House Referral:  Clinical Social Work  Discharge planning Services     Post Acute Care Choice:  NA Choice offered to:  NA  DME Arranged:    DME Agency:     HH Arranged:    HH Agency:     Status of Service:  Completed, signed off  Medicare Important Message Given:    Date Medicare IM Given:    Medicare IM give by:    Date Additional Medicare IM Given:    Additional Medicare Important Message give by:     If discussed at Long Length of Stay Meetings, dates discussed:    Additional Comments:  Starlyn Skeans, RN 04/13/2015, 3:47 PM

## 2015-04-14 ENCOUNTER — Inpatient Hospital Stay (HOSPITAL_COMMUNITY): Payer: Medicare Other

## 2015-04-14 LAB — MAGNESIUM: Magnesium: 1.7 mg/dL (ref 1.7–2.4)

## 2015-04-14 LAB — BASIC METABOLIC PANEL
ANION GAP: 8 (ref 5–15)
BUN: 8 mg/dL (ref 6–20)
CALCIUM: 10.2 mg/dL (ref 8.9–10.3)
CHLORIDE: 104 mmol/L (ref 101–111)
CO2: 26 mmol/L (ref 22–32)
Creatinine, Ser: 0.73 mg/dL (ref 0.44–1.00)
GFR calc non Af Amer: >60 mL/min (ref >60)
GLUCOSE: 110 mg/dL — AB (ref 65–99)
Potassium: 4.1 mmol/L (ref 3.5–5.1)
Sodium: 138 mmol/L (ref 135–145)

## 2015-04-14 MED ORDER — BENZTROPINE MESYLATE 0.5 MG PO TABS
0.5000 mg | ORAL_TABLET | Freq: Two times a day (BID) | ORAL | Status: DC
  Administered 2015-04-14 – 2015-04-20 (×12): 0.5 mg via ORAL
  Filled 2015-04-14 (×17): qty 1

## 2015-04-14 MED ORDER — RISPERIDONE 1 MG PO TABS
1.0000 mg | ORAL_TABLET | Freq: Two times a day (BID) | ORAL | Status: DC
  Administered 2015-04-14 – 2015-04-20 (×12): 1 mg via ORAL
  Filled 2015-04-14 (×13): qty 1

## 2015-04-14 MED ORDER — IPRATROPIUM-ALBUTEROL 0.5-2.5 (3) MG/3ML IN SOLN: 3.0000 mL | RESPIRATORY_TRACT | Status: DC

## 2015-04-14 NOTE — Progress Notes (Signed)
Patient Demographics  [name] [name], is a 79 y.o. female, DOB - 26-Dec-1935, ZOX:[id number]  Admit date - 04/10/2015   Admitting Physician Alberteen Sam, MD  Outpatient Primary MD for the patient is [name], MD  LOS - 3   Chief Complaint  Patient presents with  . Weakness         Subjective:   [name] [name] today has, No headache, No chest pain, No abdominal pain - No Nausea,   Assessment & Plan    Principal Problem:   Dementia with behavioral disturbance Active Problems:   Dehydration   UTI (lower urinary tract infection)   Hyponatremia   Hypokalemia   Acute kidney injury   Renal insufficiency  Acute renal failure - Prerenal azotemia from dehydration - Continue to hold ARB and NSAIDs(meloxicam) -Resolved with IV fluids.  UTI -  received 1 dose of IV Rocephin in ED ,   Treated with oral fosfomycin X2.. - Urine culture growing Escherichia coli , patient in the past has urine culture growing enterococcus, and Streptococcus, this was discussed with Dr. Orvan Falconer from infectious disease, very likely this is related to contamination, and there is no indication for long-term antibiotics suppressive therapy.  Hyponatremia - Secondary to volume depletion, resolved with IV fluid  Hypokalemia/hypomagnesemia  -  repleted, recheck in a.m.   QTc prolongation  -  Repeat EKG was done, QTc resolved , repeat EKG 9/15 showing QTc of 443 (EKG in chart, did not import to Epic?).   depression and anxiety - Patient appears to be severely depressed, has baseline dementia as well, psychiatric consult greatly appreciated, they do recommend inpatient psych admission for crisis stabilization and medication management. - Placement as per psychiatry  Chronic medical issues: Continue rivastigmine for dementia Continue topical cyclosporine for dry eyes Continue PPI  Code Status: Full    Family Communication: none at bedside,  Disposition: for inpatient psych admission   Procedures  none   Consults  Psychiatry   Medications  Scheduled Meds: . clonazePAM  1 mg Oral TID  . cycloSPORINE  1 drop Both Eyes BID  . docusate sodium  100 mg Oral BID  . DULoxetine  120 mg Oral Daily  . enoxaparin (LOVENOX) injection  40 mg Subcutaneous Q24H  . ipratropium-albuterol  3 mL Nebulization Q4H  . magnesium oxide  400 mg Oral BID  . pantoprazole  40 mg Oral Daily  . potassium chloride  20 mEq Oral Daily  . risperiDONE  0.5 mg Oral BID  . rivastigmine  3 mg Oral BID WC   Continuous Infusions:   PRN Meds:.  DVT Prophylaxis  Lovenox -  Lab Results  Component Value Date   PLT 203 04/12/2015   Antibiotics   Anti-infectives    Start     Dose/Rate Route Frequency Ordered Stop   04/11/15 0100  cefTRIAXone (ROCEPHIN) 1 g in dextrose 5 % 50 mL IVPB     1 g 100 mL/hr over 30 Minutes Intravenous  Once 04/11/15 0053 04/11/15 0148          Objective:   Filed Vitals:   04/13/15 1854 04/13/15 2040 04/14/15 0502 04/14/15 0847  BP: 131/65 131/66 118/61 110/49  Pulse: 81 79 91 95  Temp: 97.8 F (36.6  C) 97.7 F (36.5 C) 97.8 F (36.6 C) 97.6 F (36.4 C)  TempSrc: Axillary Oral Oral Oral  Resp: 18 16 18 17   Weight:  56.427 kg (124 lb 6.4 oz)    SpO2: 79% 93% 97% 98%    Wt Readings from Last 3 Encounters:  04/13/15 56.427 kg (124 lb 6.4 oz)  01/24/15 74.526 kg (164 lb 4.8 oz)     Intake/Output Summary (Last 24 hours) at 04/14/15 1223 Last data filed at 04/14/15 1149  Gross per 24 hour  Intake    600 ml  Output    401 ml  Net    199 ml     Physical Exam  Awake , mood appears depressed, as well as anxiety. [place].AT,PERRAL Supple Neck,No JVD,  Symmetrical Chest wall movement, Good air movement bilaterally, RRR,No Gallops,Rubs or new Murmurs,  +ve B.Sounds, Abd Soft, No tenderness, No organomegaly appriciated, No rebound - guarding or rigidity. No  Cyanosis, Clubbing or edema, No new Rash or bruise    Data Review   Micro Results Recent Results (from the past 240 hour(s))  Urine culture     Status: None   Collection Time: 04/10/15 11:10 PM  Result Value Ref Range Status   Specimen Description URINE, RANDOM  Final   Special Requests NONE  Final   Culture   Final    >=100,000 COLONIES/mL ESCHERICHIA COLI Confirmed Extended Spectrum Beta-Lactamase Producer (ESBL)    Report Status 04/13/2015 FINAL  Final   Organism ID, Bacteria ESCHERICHIA COLI  Final      Susceptibility   Escherichia coli - MIC*    AMPICILLIN >=32 RESISTANT Resistant     CEFAZOLIN >=64 RESISTANT Resistant     CEFTRIAXONE >=64 RESISTANT Resistant     CIPROFLOXACIN >=4 RESISTANT Resistant     GENTAMICIN >=16 RESISTANT Resistant     IMIPENEM <=0.25 SENSITIVE Sensitive     NITROFURANTOIN <=16 SENSITIVE Sensitive     TRIMETH/SULFA >=320 RESISTANT Resistant     AMPICILLIN/SULBACTAM >=32 RESISTANT Resistant     PIP/TAZO 8 SENSITIVE Sensitive     * >=100,000 COLONIES/mL ESCHERICHIA COLI  MRSA PCR Screening     Status: None   Collection Time: 04/11/15  4:00 AM  Result Value Ref Range Status   MRSA by PCR NEGATIVE NEGATIVE Final    Comment:        The GeneXpert MRSA Assay (FDA approved for NASAL specimens only), is one component of a comprehensive MRSA colonization surveillance program. It is not intended to diagnose MRSA infection nor to guide or monitor treatment for MRSA infections.     Radiology Reports Ct Abdomen Pelvis Wo Contrast  04/11/2015   CLINICAL DATA:  79 year old female with abdominal pain  EXAM: CT ABDOMEN AND PELVIS WITHOUT CONTRAST  TECHNIQUE: Multidetector CT imaging of the abdomen and pelvis was performed following the standard protocol without IV contrast.  COMPARISON:  CT dated 09/21/2010  FINDINGS: Evaluation of this exam is limited in the absence of intravenous contrast. Evaluation is also limited due to streak artifact caused by  patient's arms.  Mild emphysematous changes of the visualized lung bases. Bibasilar dependent atelectatic changes noted. No intra-abdominal free air or free fluid.  Cholecystectomy. The liver, pancreas, spleen, adrenal glands appear unremarkable. Small 2-3 mm nonobstructing right renal calculi. There is a stable 1 cm left renal upper pole hypodense lesion containing fat density most compatible with an angiomyolipoma. There is no hydronephrosis on either side. The visualized ureters and urinary bladder appear unremarkable. Hysterectomy.  Sigmoid diverticulosis without active inflammation. No evidence of bowel obstruction or inflammation. Normal appendix.  There is aortoiliac atherosclerotic disease. No portal venous gas. There is no lymphadenopathy. The abdominal wall soft tissues appear unremarkable. There is degenerative changes of the spine. No acute fractures.  IMPRESSION: No acute intra-abdominal or pelvic pathology.  No hydronephrosis or obstructing stone. Small nonobstructing right renal calculi and a stable fat containing left renal upper pole lesion most compatible with an angiomyolipoma.  Diverticulosis.  No evidence of bowel obstruction or inflammation.   Electronically Signed   By: Elgie Collard M.D.   On: 04/11/2015 01:20   Dg Chest 2 View  03/16/2015   CLINICAL DATA:  Mental status changes today.  EXAM: CHEST  2 VIEW  COMPARISON:  01/24/2015.  FINDINGS: The cardiac silhouette, mediastinal and hilar contours are within normal limits and stable. The lungs are clear. Chronic bronchitic changes and upper lobe scarring changes. No pleural effusion. The bony thorax is intact.  IMPRESSION: Chronic lung changes but no acute pulmonary findings.   Electronically Signed   By: Rudie Meyer M.D.   On: 03/16/2015 12:02   Ct Head Wo Contrast  04/11/2015   CLINICAL DATA:  Weakness for 3 days. Headache. Stated history of fall.  EXAM: CT HEAD WITHOUT CONTRAST  CT CERVICAL SPINE WITHOUT CONTRAST  TECHNIQUE:  Multidetector CT imaging of the head and cervical spine was performed following the standard protocol without intravenous contrast. Multiplanar CT image reconstructions of the cervical spine were also generated.  COMPARISON:  Head CT 03/16/2015  FINDINGS: CT HEAD FINDINGS  Unchanged atrophy and chronic small vessel ischemia.No intracranial hemorrhage, mass effect, or midline shift. No hydrocephalus. The basilar cisterns are patent. No evidence of territorial infarct. No intracranial fluid collection. Calvarium is intact. Included paranasal sinuses and mastoid air cells are well aerated.  CT CERVICAL SPINE FINDINGS  There is no fracture or acute subluxation. The dens is intact. There are no jumped or perched facets. Disc space narrowing and endplate spurring at C4-C5, C5-C6, and C6-C7. Scattered facet arthropathy. No prevertebral soft tissue edema.  IMPRESSION: 1.  No acute intracranial abnormality. 2. No acute bony abnormality of the cervical spine. Mild multilevel degenerative change.   Electronically Signed   By: Rubye Oaks M.D.   On: 04/11/2015 01:15   Ct Head Wo Contrast  03/16/2015   CLINICAL DATA:  Altered mental status, history of dementia  EXAM: CT HEAD WITHOUT CONTRAST  TECHNIQUE: Contiguous axial images were obtained from the base of the skull through the vertex without intravenous contrast.  COMPARISON:  01/05/2014  FINDINGS: No skull fracture is noted. Paranasal sinuses and mastoid air cells are unremarkable. Mild cerebral atrophy. Mild periventricular white matter decreased attenuation consistent with chronic small vessel ischemic changes. No definite acute cortical infarction. No intracranial hemorrhage, mass effect or midline shift. No mass lesion is noted on this unenhanced scan.  IMPRESSION: No acute intracranial abnormality. Mild cerebral atrophy. Mild periventricular white matter decreased attenuation consistent with chronic small vessel ischemic changes.   Electronically Signed   By:  Natasha Mead M.D.   On: 03/16/2015 11:26   Ct Cervical Spine Wo Contrast  04/11/2015   CLINICAL DATA:  Weakness for 3 days. Headache. Stated history of fall.  EXAM: CT HEAD WITHOUT CONTRAST  CT CERVICAL SPINE WITHOUT CONTRAST  TECHNIQUE: Multidetector CT imaging of the head and cervical spine was performed following the standard protocol without intravenous contrast. Multiplanar CT image reconstructions of the cervical spine were also generated.  COMPARISON:  Head CT 03/16/2015  FINDINGS: CT HEAD FINDINGS  Unchanged atrophy and chronic small vessel ischemia.No intracranial hemorrhage, mass effect, or midline shift. No hydrocephalus. The basilar cisterns are patent. No evidence of territorial infarct. No intracranial fluid collection. Calvarium is intact. Included paranasal sinuses and mastoid air cells are well aerated.  CT CERVICAL SPINE FINDINGS  There is no fracture or acute subluxation. The dens is intact. There are no jumped or perched facets. Disc space narrowing and endplate spurring at C4-C5, C5-C6, and C6-C7. Scattered facet arthropathy. No prevertebral soft tissue edema.  IMPRESSION: 1.  No acute intracranial abnormality. 2. No acute bony abnormality of the cervical spine. Mild multilevel degenerative change.   Electronically Signed   By: Rubye Oaks M.D.   On: 04/11/2015 01:15     CBC  Recent Labs Lab 04/10/15 2101 04/11/15 0400 04/12/15 0440  WBC 6.3 9.6 5.4  HGB 13.2 13.4 11.3*  HCT 38.3 38.3 33.2*  PLT 225 PLATELET CLUMPS NOTED ON SMEAR, COUNT APPEARS ADEQUATE 203  MCV 90.8 90.5 89.7  MCH 31.3 31.7 30.5  MCHC 34.5 35.0 34.0  RDW 12.6 12.8 12.4    Chemistries   Recent Labs Lab 04/10/15 2101 04/11/15 0400 04/12/15 0440 04/13/15 0515 04/14/15 0632  NA 134* 135 136 136 138  K 3.0* 3.8 2.6* 3.5 4.1  CL 97* 99* 103 103 104  CO2 21* 21* 25 25 26   GLUCOSE 136* 100* 103* 109* 110*  BUN 24* 21* 8 <5* 8  CREATININE 2.01* 1.63* 0.73 0.63 0.73  CALCIUM 10.4* 9.8 9.0 9.6  10.2  MG  --   --  1.2* 1.3* 1.7  AST 23  --   --   --   --   ALT 22  --   --   --   --   ALKPHOS 81  --   --   --   --   BILITOT 1.0  --   --   --   --    ------------------------------------------------------------------------------------------------------------------ estimated creatinine clearance is 50.9 mL/min (by C-G formula based on Cr of 0.73). ------------------------------------------------------------------------------------------------------------------ No results for input(s): HGBA1C in the last 72 hours. ------------------------------------------------------------------------------------------------------------------ No results for input(s): CHOL, HDL, LDLCALC, TRIG, CHOLHDL, LDLDIRECT in the last 72 hours. ------------------------------------------------------------------------------------------------------------------ No results for input(s): TSH, T4TOTAL, T3FREE, THYROIDAB in the last 72 hours.  Invalid input(s): FREET3 ------------------------------------------------------------------------------------------------------------------ No results for input(s): VITAMINB12, FOLATE, FERRITIN, TIBC, IRON, RETICCTPCT in the last 72 hours.  Coagulation profile No results for input(s): INR, PROTIME in the last 168 hours.  No results for input(s): DDIMER in the last 72 hours.  Cardiac Enzymes No results for input(s): CKMB, TROPONINI, MYOGLOBIN in the last 168 hours.  Invalid input(s): CK ------------------------------------------------------------------------------------------------------------------ Invalid input(s): POCBNP     Time Spent in minutes   30 minutes   Martyna Thorns M.D on 04/14/2015 at 12:23 PM  Between 7am to 7pm - Pager - [phone]  After 7pm go to [email or web address] - password [facility]  [facility]   Office  [phone]

## 2015-04-14 NOTE — Progress Notes (Signed)
PT with high anxiety and confusion.  BBS clear on auscultation, Sats 98% on RA.  No distress noted.  RT will continue to monitor.  No respiratory intervention indicated at this time.

## 2015-04-14 NOTE — Consult Note (Signed)
[facility] Face-to-Face Psychiatry Consult Follow Up  Reason for Consult:  Dementia and severe depression Referring Physician:  Dr. Spero Curb Patient Identification: [name] [name] MRN:  [id number] Principal Diagnosis: Dementia with behavioral disturbance Diagnosis:   Patient Active Problem List   Diagnosis Date Noted  . Acute kidney injury [N17.9] 04/11/2015  . Renal insufficiency [N28.9] 04/11/2015  . Delirium [R41.0] 03/17/2015  . Polypharmacy [Z79.899] 01/27/2015  . Dementia with behavioral disturbance [F03.91] 01/26/2015  . Hypokalemia [E87.6] 01/26/2015  . Pressure ulcer [L89.90] 01/25/2015  . Dehydration [E86.0] 01/24/2015  . UTI (lower urinary tract infection) [N39.0] 01/24/2015  . Metabolic encephalopathy [F02.63] 01/24/2015  . Hyponatremia [E87.1] 01/24/2015    Total Time spent with patient: 30 minutes  Subjective:   [name] [name] is a 79 y.o. female patient admitted with sedation.  HPI: [name] [name] is a 79 y.o. female , resident of [facility] home admitted to [facility] with increased sedation. Psychiatric consultation requested for increased symptoms of depression, anxiety, behavior problems like screaming and yelling constantly. Patient appeared awake, alert but not oriented and also seems to be confused and repeatedly flip and flap for answers. Patient stated that she wanted to talk to physician who can help to control her depression, anxiety, nervousness, forgetful, being afraid the time. Patient daughter was not at bedside during this evaluation because she has been working and consequent after-hours to the hospital. Patient with a past medical history significant for depression, advanced dementia, hypertension, and anxiety who presents with weakness and new AKI. Patient has been taking lurasidone, Cymbalta, clonazepam and rivastigmine at the time admission to the hospital. [facility] ask psychiatric social service to contact patient daughter to get  the collateral information and possible recent medication changes made at nursing home.   HPI Elements:   Location:  Depression, anxiety, forgetfulness and scaphoid. Quality:  Poor. Severity:  Chronic versus acute. Timing:  Unknown and increased sedation and confusion. Duration:  Few days. Context:  Out-of-home placement and questionable medication management effectiveness.   Interval History: Patient complains feeling overwhelmed, anxious, depressed and frequently asking staff members to come and stay with her. Patient is worried about her daughter who is at home this weekend. Patient also reported she won't stay in hospital at least 2 more nights so that she can get help with the medication management for depression and anxiety. Staff reported that patient is needed all the time and hollering min cc people and the hallway. Patient is known to have chronic, recurrent emotional problem throughout her life including depression, bipolar disorder, anxiety and personality disorder as per her daughter. Reportedly about three years ago she was diagnosed with mild to moderate dementia. We will increase her anti-psychiatric medications Risperdal to 1 mg twice daily and also add benztropine 2.5 mg twice daily for EPS.  She was previously was admitted to [facility] and was also received ECT x 10 session without clinical response, all of this is more than three years ago.  Patient and her daughter requested to be place in acute psych hospital for better psych medication management at this time. Spoke with psych social service for possible referral to [facility] psych admission.   She has been receiving medication management from in house provider from, [facility], Main office in [place], [place] [phone]: Provider has been: Pamella Pert, NP.   Past Medical History:  Past Medical History  Diagnosis Date  . Dementia   . Anxiety   . MDD (major depressive disorder)   .  Hypertension   .  Head ache   . Tremor   . Osteoarthritis     Past Surgical History  Procedure Laterality Date  . Abdominal hysterectomy    . Hemorroidectomy    . Pterygium excision    . Foot surgery      right   Family History: History reviewed. No pertinent family history. Social History:  History  Alcohol Use No     History  Drug Use No    Social History   Social History  . Marital Status: Widowed    Spouse Name: N/A  . Number of Children: N/A  . Years of Education: N/A   Social History Main Topics  . Smoking status: Current Every Day Smoker    Start date: 01/24/2015  . Smokeless tobacco: None  . Alcohol Use: No  . Drug Use: No  . Sexual Activity: Not Asked   Other Topics Concern  . None   Social History Narrative   Additional Social History:                          Allergies:   Allergies  Allergen Reactions  . Abilify [Aripiprazole] Other (See Comments)    Caused pt to not be able to walk and was unable to care for herself     Labs:  Results for orders placed or performed during the hospital encounter of 04/10/15 (from the past 48 hour(s))  Basic metabolic panel     Status: Abnormal   Collection Time: 04/13/15  5:15 AM  Result Value Ref Range   Sodium 136 135 - 145 mmol/L   Potassium 3.5 3.5 - 5.1 mmol/L    Comment: DELTA CHECK NOTED   Chloride 103 101 - 111 mmol/L   CO2 25 22 - 32 mmol/L   Glucose, Bld 109 (H) 65 - 99 mg/dL   BUN <5 (L) 6 - 20 mg/dL   Creatinine, Ser 0.63 0.44 - 1.00 mg/dL   Calcium 9.6 8.9 - 10.3 mg/dL   GFR calc non Af Amer >60 >60 mL/min   GFR calc Af Amer >60 >60 mL/min    Comment: (NOTE) The eGFR has been calculated using the CKD EPI equation. This calculation has not been validated in all clinical situations. eGFR's persistently <60 mL/min signify possible Chronic Kidney Disease.    Anion gap 8 5 - 15  Magnesium     Status: Abnormal   Collection Time: 04/13/15  5:15 AM  Result Value Ref Range   Magnesium 1.3 (L) 1.7 -  2.4 mg/dL  Basic metabolic panel     Status: Abnormal   Collection Time: 04/14/15  6:32 AM  Result Value Ref Range   Sodium 138 135 - 145 mmol/L   Potassium 4.1 3.5 - 5.1 mmol/L   Chloride 104 101 - 111 mmol/L   CO2 26 22 - 32 mmol/L   Glucose, Bld 110 (H) 65 - 99 mg/dL   BUN 8 6 - 20 mg/dL   Creatinine, Ser 0.73 0.44 - 1.00 mg/dL   Calcium 10.2 8.9 - 10.3 mg/dL   GFR calc non Af Amer >60 >60 mL/min   GFR calc Af Amer >60 >60 mL/min    Comment: (NOTE) The eGFR has been calculated using the CKD EPI equation. This calculation has not been validated in all clinical situations. eGFR's persistently <60 mL/min signify possible Chronic Kidney Disease.    Anion gap 8 5 - 15  Magnesium  Status: None   Collection Time: 04/14/15  6:32 AM  Result Value Ref Range   Magnesium 1.7 1.7 - 2.4 mg/dL    Vitals: Blood pressure 127/57, pulse 96, temperature 97.7 F (36.5 C), temperature source Oral, resp. rate 18, weight 56.427 kg (124 lb 6.4 oz), SpO2 97 %.  Risk to Self: Is patient at risk for suicide?: No Risk to Others:   Prior Inpatient Therapy:   Prior Outpatient Therapy:    Current Facility-Administered Medications  Medication Dose Route Frequency Provider Last Rate Last Dose  . acetaminophen (TYLENOL) tablet 650 mg  650 mg Oral Q4H PRN Rhetta Mura Schorr, NP      . clonazePAM (KLONOPIN) tablet 1 mg  1 mg Oral TID Ambrose Finland, MD   1 mg at 04/14/15 0810  . cycloSPORINE (RESTASIS) 0.05 % ophthalmic emulsion 1 drop  1 drop Both Eyes BID Edwin Dada, MD   1 drop at 04/14/15 1219  . docusate sodium (COLACE) capsule 100 mg  100 mg Oral BID Edwin Dada, MD   100 mg at 04/14/15 0810  . DULoxetine (CYMBALTA) DR capsule 120 mg  120 mg Oral Daily Ambrose Finland, MD   120 mg at 04/14/15 0810  . enoxaparin (LOVENOX) injection 40 mg  40 mg Subcutaneous Q24H Edwin Dada, MD   40 mg at 04/14/15 0811  . magnesium oxide (MAG-OX) tablet 400 mg  400 mg  Oral BID Albertine Patricia, MD   400 mg at 04/14/15 0811  . pantoprazole (PROTONIX) EC tablet 40 mg  40 mg Oral Daily Edwin Dada, MD   40 mg at 04/14/15 0811  . potassium chloride SA (K-DUR,KLOR-CON) CR tablet 20 mEq  20 mEq Oral Daily Albertine Patricia, MD   20 mEq at 04/14/15 0811  . risperiDONE (RISPERDAL) tablet 0.5 mg  0.5 mg Oral BID Ambrose Finland, MD   0.5 mg at 04/14/15 1220  . rivastigmine (EXELON) capsule 3 mg  3 mg Oral BID WC Ambrose Finland, MD   3 mg at 04/14/15 1220    Musculoskeletal: Strength & Muscle Tone: decreased Gait & Station: unable to stand Patient leans: N/A  Psychiatric Specialty Exam: Physical Exam as per history and physical   ROS confusion, forgetfulness, dehydration, UTI, nervousness, anxiousness, depression. Patient denies nausea, vomiting, shortness of breath and chest pain   Blood pressure 127/57, pulse 96, temperature 97.7 F (36.5 C), temperature source Oral, resp. rate 18, weight 56.427 kg (124 lb 6.4 oz), SpO2 97 %.Body mass index is 21.34 kg/(m^2).  General Appearance: Guarded  Eye Contact::  Good  Speech:  Blocked and Clear and Coherent  Volume:  Normal  Mood:  Anxious and Depressed  Affect:  Appropriate, Non-Congruent, Inappropriate and Labile  Thought Process:  Irrelevant and Loose  Orientation:  Other:  Oriented to first name, last name, and name of the hospital only.  Thought Content:  Rumination  Suicidal Thoughts:  No  Homicidal Thoughts:  No  Memory:  Immediate;   Fair Recent;   Poor  Judgement:  Fair  Insight:  Shallow  Psychomotor Activity:  Decreased  Concentration:  Poor  Recall:  Poor  Fund of Knowledge:Fair  Language: Fair  Akathisia:  Negative  Handed:  Right  AIMS (if indicated):     Assets:  Desire for Improvement Financial Resources/Insurance Leisure Time Physical Health Social Support Transportation  ADL's:  Impaired  Cognition: Impaired,  Moderate  Sleep:      Medical Decision  Making: Review of  Psycho-Social Stressors (1), Review or order clinical lab tests (1), Established Problem, Worsening (2), Review of Last Therapy Session (1), Review or order medicine tests (1), Review of Medication Regimen & Side Effects (2) and Review of New Medication or Change in Dosage (2)  Treatment Plan Summary: Patient with increased symptoms of dementia, depression, anxiety, disturbed sleep and appetite but no active suicide or homicide ideation. She was not able to care for herself.  Daily contact with patient to assess and evaluate symptoms and progress in treatment and Medication management  Plan:  Patient has safety concerns except she can not care for herself without assistance Increase risperidone 1 mg BID - behavioral problems associated with the dementia Continue clonazepam 1 mg 3 times daily for anxiety Continue Cymbalta 120 mg daily morning for depression Continue Exelon 3 mg twice daily for dementia Recommend psychiatric Inpatient admission when medically cleared. Supportive therapy provided about ongoing stressors. Appreciate psychiatric consultation and follow up as clinically required Please contact [phone] or [phone] if needs further assistance  Disposition: Patient will be referred to geriatric psych admission for crisis stabilization and medication management when medically stable. Patient is receiving in house mental health provider at [facility], Main office in [place], [place], [phone]. Provider has been: Pamella Pert, NP  Lisette Grinder R. 04/14/2015 4:04 PM Value

## 2015-04-14 NOTE — Progress Notes (Signed)
04/14/2015 4:11 PM  Nurse tech came to this nurse stating that the patient verbally said "I just want to throw myself out the window". Nurse Tech said the patient is very tearful and emotional. Informed attending RN, Charge Nurse, and MD. New orders written. Attending AC was notified as well.   [employer], RN-BC, [employer] [facility] Phone [phone]

## 2015-04-14 NOTE — Progress Notes (Signed)
Received call from Dr. Randol Kern concerning psych placement, SW consult placed.

## 2015-04-15 NOTE — Progress Notes (Signed)
Patient Demographics  [name] [name], is a 79 y.o. female, DOB - [birth year], ZOX:[id number]  Admit date - 04/10/2015   Admitting Physician Alberteen Sam, MD  Outpatient Primary MD for the patient is [name], MD  LOS - 4   Chief Complaint  Patient presents with  . Weakness         Subjective:   [name] today has, No headache, No chest pain, No abdominal pain - No Nausea,   Assessment & Plan    Principal Problem:   Dementia with behavioral disturbance Active Problems:   Dehydration   UTI (lower urinary tract infection)   Hyponatremia   Hypokalemia   Acute kidney injury   Renal insufficiency  Acute renal failure - Prerenal azotemia from dehydration - Continue to hold ARB and NSAIDs(meloxicam) -Resolved with IV fluids.  UTI -  received 1 dose of IV Rocephin in ED ,   Treated with oral fosfomycin X2.. - Urine culture growing Escherichia coli , patient in the past has urine culture growing enterococcus, and Streptococcus, this was discussed with Dr. Orvan Falconer from infectious disease, very likely this is related to contamination, and there is no indication for long-term antibiotics suppressive therapy.  Hyponatremia - Secondary to volume depletion, resolved with IV fluid  Hypokalemia/hypomagnesemia  -  repleted, recheck in a.m.   QTc prolongation  -  Repeat EKG was done, QTc resolved , repeat EKG 9/15 showing QTc of 443 (EKG in chart, did not import to Epic?).   depression and anxiety - Patient appears to be severely depressed, has baseline dementia as well, psychiatric consult greatly appreciated, they do recommend inpatient psych admission for crisis stabilization and medication management. Currently on suicide precaution. - Management as per psychiatry  Chronic medical issues: Continue rivastigmine for dementia Continue topical cyclosporine for dry  eyes Continue PPI  Code Status: Full   Family Communication: none at bedside,  Disposition: for inpatient geriatric psych admission when bed is available   Procedures  none   Consults  Psychiatry   Medications  Scheduled Meds: . benztropine  0.5 mg Oral BID  . clonazePAM  1 mg Oral TID  . cycloSPORINE  1 drop Both Eyes BID  . docusate sodium  100 mg Oral BID  . DULoxetine  120 mg Oral Daily  . enoxaparin (LOVENOX) injection  40 mg Subcutaneous Q24H  . magnesium oxide  400 mg Oral BID  . pantoprazole  40 mg Oral Daily  . potassium chloride  20 mEq Oral Daily  . risperiDONE  1 mg Oral BID  . rivastigmine  3 mg Oral BID WC   Continuous Infusions:   PRN Meds:.  DVT Prophylaxis  Lovenox -  Lab Results  Component Value Date   PLT 203 04/12/2015   Antibiotics   Anti-infectives    Start     Dose/Rate Route Frequency Ordered Stop   04/11/15 0100  cefTRIAXone (ROCEPHIN) 1 g in dextrose 5 % 50 mL IVPB     1 g 100 mL/hr over 30 Minutes Intravenous  Once 04/11/15 0053 04/11/15 0148          Objective:   Filed Vitals:   04/14/15 1448 04/14/15 2054 04/15/15 0525 04/15/15 1000  BP: 127/57 130/66 110/58 135/56  Pulse:  96 93 89 96  Temp: 97.7 F (36.5 C) 97.7 F (36.5 C) 97.7 F (36.5 C) 97.5 F (36.4 C)  TempSrc: Oral Oral Oral Oral  Resp: 18 16 14 16   Weight:      SpO2: 97% 94% 96% 97%    Wt Readings from Last 3 Encounters:  04/13/15 56.427 kg (124 lb 6.4 oz)  01/24/15 74.526 kg (164 lb 4.8 oz)     Intake/Output Summary (Last 24 hours) at 04/15/15 1345 Last data filed at 04/15/15 1300  Gross per 24 hour  Intake    960 ml  Output   1300 ml  Net   -340 ml     Physical Exam  Awake , appears less depressed today, but still having significant anxiety. [place].AT,PERRAL Supple Neck,No JVD,  Symmetrical Chest wall movement, Good air movement bilaterally, RRR,No Gallops,Rubs or new Murmurs,  +ve B.Sounds, Abd Soft, No tenderness, No organomegaly  appriciated, No rebound - guarding or rigidity. No Cyanosis, Clubbing or edema, No new Rash or bruise    Data Review   Micro Results Recent Results (from the past 240 hour(s))  Urine culture     Status: None   Collection Time: 04/10/15 11:10 PM  Result Value Ref Range Status   Specimen Description URINE, RANDOM  Final   Special Requests NONE  Final   Culture   Final    >=100,000 COLONIES/mL ESCHERICHIA COLI Confirmed Extended Spectrum Beta-Lactamase Producer (ESBL)    Report Status 04/13/2015 FINAL  Final   Organism ID, Bacteria ESCHERICHIA COLI  Final      Susceptibility   Escherichia coli - MIC*    AMPICILLIN >=32 RESISTANT Resistant     CEFAZOLIN >=64 RESISTANT Resistant     CEFTRIAXONE >=64 RESISTANT Resistant     CIPROFLOXACIN >=4 RESISTANT Resistant     GENTAMICIN >=16 RESISTANT Resistant     IMIPENEM <=0.25 SENSITIVE Sensitive     NITROFURANTOIN <=16 SENSITIVE Sensitive     TRIMETH/SULFA >=320 RESISTANT Resistant     AMPICILLIN/SULBACTAM >=32 RESISTANT Resistant     PIP/TAZO 8 SENSITIVE Sensitive     * >=100,000 COLONIES/mL ESCHERICHIA COLI  MRSA PCR Screening     Status: None   Collection Time: 04/11/15  4:00 AM  Result Value Ref Range Status   MRSA by PCR NEGATIVE NEGATIVE Final    Comment:        The GeneXpert MRSA Assay (FDA approved for NASAL specimens only), is one component of a comprehensive MRSA colonization surveillance program. It is not intended to diagnose MRSA infection nor to guide or monitor treatment for MRSA infections.     Radiology Reports Ct Abdomen Pelvis Wo Contrast  04/11/2015   CLINICAL DATA:  79 year old female with abdominal pain  EXAM: CT ABDOMEN AND PELVIS WITHOUT CONTRAST  TECHNIQUE: Multidetector CT imaging of the abdomen and pelvis was performed following the standard protocol without IV contrast.  COMPARISON:  CT dated 09/21/2010  FINDINGS: Evaluation of this exam is limited in the absence of intravenous contrast. Evaluation  is also limited due to streak artifact caused by patient's arms.  Mild emphysematous changes of the visualized lung bases. Bibasilar dependent atelectatic changes noted. No intra-abdominal free air or free fluid.  Cholecystectomy. The liver, pancreas, spleen, adrenal glands appear unremarkable. Small 2-3 mm nonobstructing right renal calculi. There is a stable 1 cm left renal upper pole hypodense lesion containing fat density most compatible with an angiomyolipoma. There is no hydronephrosis on either side. The visualized ureters and urinary bladder  appear unremarkable. Hysterectomy.  Sigmoid diverticulosis without active inflammation. No evidence of bowel obstruction or inflammation. Normal appendix.  There is aortoiliac atherosclerotic disease. No portal venous gas. There is no lymphadenopathy. The abdominal wall soft tissues appear unremarkable. There is degenerative changes of the spine. No acute fractures.  IMPRESSION: No acute intra-abdominal or pelvic pathology.  No hydronephrosis or obstructing stone. Small nonobstructing right renal calculi and a stable fat containing left renal upper pole lesion most compatible with an angiomyolipoma.  Diverticulosis.  No evidence of bowel obstruction or inflammation.   Electronically Signed   By: Elgie Collard M.D.   On: 04/11/2015 01:20   Dg Chest 2 View  04/14/2015   CLINICAL DATA:  Dyspnea.  History of hypertension.  EXAM: CHEST  2 VIEW  COMPARISON:  03/16/2015  FINDINGS: The heart size and mediastinal contours are within normal limits. Both lungs are clear. The visualized skeletal structures are unremarkable.  IMPRESSION: No active cardiopulmonary disease.   Electronically Signed   By: Elige Ko   On: 04/14/2015 14:09   Ct Head Wo Contrast  04/11/2015   CLINICAL DATA:  Weakness for 3 days. Headache. Stated history of fall.  EXAM: CT HEAD WITHOUT CONTRAST  CT CERVICAL SPINE WITHOUT CONTRAST  TECHNIQUE: Multidetector CT imaging of the head and cervical spine  was performed following the standard protocol without intravenous contrast. Multiplanar CT image reconstructions of the cervical spine were also generated.  COMPARISON:  Head CT 03/16/2015  FINDINGS: CT HEAD FINDINGS  Unchanged atrophy and chronic small vessel ischemia.No intracranial hemorrhage, mass effect, or midline shift. No hydrocephalus. The basilar cisterns are patent. No evidence of territorial infarct. No intracranial fluid collection. Calvarium is intact. Included paranasal sinuses and mastoid air cells are well aerated.  CT CERVICAL SPINE FINDINGS  There is no fracture or acute subluxation. The dens is intact. There are no jumped or perched facets. Disc space narrowing and endplate spurring at C4-C5, C5-C6, and C6-C7. Scattered facet arthropathy. No prevertebral soft tissue edema.  IMPRESSION: 1.  No acute intracranial abnormality. 2. No acute bony abnormality of the cervical spine. Mild multilevel degenerative change.   Electronically Signed   By: Rubye Oaks M.D.   On: 04/11/2015 01:15   Ct Cervical Spine Wo Contrast  04/11/2015   CLINICAL DATA:  Weakness for 3 days. Headache. Stated history of fall.  EXAM: CT HEAD WITHOUT CONTRAST  CT CERVICAL SPINE WITHOUT CONTRAST  TECHNIQUE: Multidetector CT imaging of the head and cervical spine was performed following the standard protocol without intravenous contrast. Multiplanar CT image reconstructions of the cervical spine were also generated.  COMPARISON:  Head CT 03/16/2015  FINDINGS: CT HEAD FINDINGS  Unchanged atrophy and chronic small vessel ischemia.No intracranial hemorrhage, mass effect, or midline shift. No hydrocephalus. The basilar cisterns are patent. No evidence of territorial infarct. No intracranial fluid collection. Calvarium is intact. Included paranasal sinuses and mastoid air cells are well aerated.  CT CERVICAL SPINE FINDINGS  There is no fracture or acute subluxation. The dens is intact. There are no jumped or perched facets. Disc  space narrowing and endplate spurring at C4-C5, C5-C6, and C6-C7. Scattered facet arthropathy. No prevertebral soft tissue edema.  IMPRESSION: 1.  No acute intracranial abnormality. 2. No acute bony abnormality of the cervical spine. Mild multilevel degenerative change.   Electronically Signed   By: Rubye Oaks M.D.   On: 04/11/2015 01:15     CBC  Recent Labs Lab 04/10/15 2101 04/11/15 0400 04/12/15 0440  WBC  6.3 9.6 5.4  HGB 13.2 13.4 11.3*  HCT 38.3 38.3 33.2*  PLT 225 PLATELET CLUMPS NOTED ON SMEAR, COUNT APPEARS ADEQUATE 203  MCV 90.8 90.5 89.7  MCH 31.3 31.7 30.5  MCHC 34.5 35.0 34.0  RDW 12.6 12.8 12.4    Chemistries   Recent Labs Lab 04/10/15 2101 04/11/15 0400 04/12/15 0440 04/13/15 0515 04/14/15 0632  NA 134* 135 136 136 138  K 3.0* 3.8 2.6* 3.5 4.1  CL 97* 99* 103 103 104  CO2 21* 21* GLUCOSE 136* 100* 103* 109* 110*  BUN 24* 21* 8 <5* 8  CREATININE 2.01* 1.63* 0.73 0.63 0.73  CALCIUM 10.4* 9.8 9.0 9.6 10.2  MG  --   --  1.2* 1.3* 1.7  AST 23  --   --   --   --   ALT 22  --   --   --   --   ALKPHOS 81  --   --   --   --   BILITOT 1.0  --   --   --   --    ------------------------------------------------------------------------------------------------------------------ estimated creatinine clearance is 50.9 mL/min (by C-G formula based on Cr of 0.73). ------------------------------------------------------------------------------------------------------------------ No results for input(s): HGBA1C in the last 72 hours. ------------------------------------------------------------------------------------------------------------------ No results for input(s): CHOL, HDL, LDLCALC, TRIG, CHOLHDL, LDLDIRECT in the last 72 hours. ------------------------------------------------------------------------------------------------------------------ No results for input(s): TSH, T4TOTAL, T3FREE, THYROIDAB in the last 72 hours.  Invalid input(s):  FREET3 ------------------------------------------------------------------------------------------------------------------ No results for input(s): VITAMINB12, FOLATE, FERRITIN, TIBC, IRON, RETICCTPCT in the last 72 hours.  Coagulation profile No results for input(s): INR, PROTIME in the last 168 hours.  No results for input(s): DDIMER in the last 72 hours.  Cardiac Enzymes No results for input(s): CKMB, TROPONINI, MYOGLOBIN in the last 168 hours.  Invalid input(s): CK ------------------------------------------------------------------------------------------------------------------ Invalid input(s): POCBNP     Time Spent in minutes   20 minutes   ELGERGAWY, DAWOOD M.D on 04/15/2015 at 1:45 PM  Between 7am to 7pm - Pager - [phone]  After 7pm go to [email or web address] - password [facility]  [facility]   Office  [phone]

## 2015-04-15 NOTE — Progress Notes (Signed)
Disposition CSW completed patient referrals to the following Geri-Psych inpatient facilities:  [facility]. Tory Emerald  CSW will continue to assist as needed with placement.  [facility] Disposition CSW [phone]

## 2015-04-16 NOTE — Progress Notes (Addendum)
Patient Demographics  [name] [name], is a 79 y.o. female, DOB - [birth year], ZOX:[id number]  Admit date - 04/10/2015   Admitting Physician Alberteen Sam, MD  Outpatient Primary MD for the patient is [name], MD  LOS - 5   Chief Complaint  Patient presents with  . Weakness         Subjective:   [name] today has, No headache, No chest pain, No abdominal pain - No Nausea,   Assessment & Plan    Principal Problem:   Dementia with behavioral disturbance Active Problems:   Dehydration   UTI (lower urinary tract infection)   Hyponatremia   Hypokalemia   Acute kidney injury   Renal insufficiency  Acute renal failure - Prerenal azotemia from dehydration - Continue to hold ARB and NSAIDs(meloxicam) -Resolved with IV fluids.  UTI -  received 1 dose of IV Rocephin in ED ,   Treated with oral fosfomycin X2.. - Urine culture growing Escherichia coli , patient in the past has urine culture growing enterococcus, and Streptococcus, this was discussed with Dr. Orvan Falconer from infectious disease, very likely this is related to contamination, and there is no indication for long-term antibiotics suppressive therapy.  Hyponatremia - Secondary to volume depletion, resolved with IV fluid  Hypokalemia/hypomagnesemia  -  repleted, recheck in a.m.   QTc prolongation  -  Repeat EKG was done, QTc resolved , repeat EKG 9/15 showing QTc of 443 >> 427(EKG in Epic posted on 9/8??).   depression and anxiety - Patient appears to be severely depressed, has baseline dementia as well, psychiatric consult greatly appreciated, recommendation for inpatient psych admission for crisis stabilization and medication management. Reevaluated 9/19, at this point they recommend discharge to ALF versus SNF. - Management as per psychiatry  Chronic medical issues: Continue rivastigmine for  dementia Continue topical cyclosporine for dry eyes Continue PPI  Code Status: Full   Family Communication: none at bedside, left message for her daughter 9/19  Disposition: For SNF on bed is available.   Procedures  none   Consults  Psychiatry   Medications  Scheduled Meds: . benztropine  0.5 mg Oral BID  . clonazePAM  1 mg Oral TID  . cycloSPORINE  1 drop Both Eyes BID  . docusate sodium  100 mg Oral BID  . DULoxetine  120 mg Oral Daily  . enoxaparin (LOVENOX) injection  40 mg Subcutaneous Q24H  . magnesium oxide  400 mg Oral BID  . pantoprazole  40 mg Oral Daily  . potassium chloride  20 mEq Oral Daily  . risperiDONE  1 mg Oral BID  . rivastigmine  3 mg Oral BID WC   Continuous Infusions:   PRN Meds:.  DVT Prophylaxis  Lovenox -  Lab Results  Component Value Date   PLT 203 04/12/2015   Antibiotics   Anti-infectives    Start     Dose/Rate Route Frequency Ordered Stop   04/11/15 0100  cefTRIAXone (ROCEPHIN) 1 g in dextrose 5 % 50 mL IVPB     1 g 100 mL/hr over 30 Minutes Intravenous  Once 04/11/15 0053 04/11/15 0148          Objective:   Filed Vitals:   04/15/15 2055 04/16/15 0500 04/16/15 1000 04/16/15  1045  BP: 111/47 118/56 118/58   Pulse: 98 102 86   Temp: 98.2 F (36.8 C) 97.7 F (36.5 C) 98 F (36.7 C)   TempSrc: Oral Oral Oral   Resp: 18 18 18    Height:    5\' 4"  (1.626 m)  Weight: 56.5 kg (124 lb 9 oz)     SpO2: 98% 99% 98%     Wt Readings from Last 3 Encounters:  04/15/15 56.5 kg (124 lb 9 oz)  01/24/15 74.526 kg (164 lb 4.8 oz)     Intake/Output Summary (Last 24 hours) at 04/16/15 1451 Last data filed at 04/16/15 1300  Gross per 24 hour  Intake    480 ml  Output      0 ml  Net    480 ml     Physical Exam  Awake ,  appears to be in a better mood, with less anxiety as well. [place].AT,PERRAL Supple Neck,No JVD,  Symmetrical Chest wall movement, Good air movement bilaterally, RRR,No Gallops,Rubs or new Murmurs,  +ve  B.Sounds, Abd Soft, No tenderness, No organomegaly appriciated, No rebound - guarding or rigidity. No Cyanosis, Clubbing or edema, No new Rash or bruise    Data Review   Micro Results Recent Results (from the past 240 hour(s))  Urine culture     Status: None   Collection Time: 04/10/15 11:10 PM  Result Value Ref Range Status   Specimen Description URINE, RANDOM  Final   Special Requests NONE  Final   Culture   Final    >=100,000 COLONIES/mL ESCHERICHIA COLI Confirmed Extended Spectrum Beta-Lactamase Producer (ESBL)    Report Status 04/13/2015 FINAL  Final   Organism ID, Bacteria ESCHERICHIA COLI  Final      Susceptibility   Escherichia coli - MIC*    AMPICILLIN >=32 RESISTANT Resistant     CEFAZOLIN >=64 RESISTANT Resistant     CEFTRIAXONE >=64 RESISTANT Resistant     CIPROFLOXACIN >=4 RESISTANT Resistant     GENTAMICIN >=16 RESISTANT Resistant     IMIPENEM <=0.25 SENSITIVE Sensitive     NITROFURANTOIN <=16 SENSITIVE Sensitive     TRIMETH/SULFA >=320 RESISTANT Resistant     AMPICILLIN/SULBACTAM >=32 RESISTANT Resistant     PIP/TAZO 8 SENSITIVE Sensitive     * >=100,000 COLONIES/mL ESCHERICHIA COLI  MRSA PCR Screening     Status: None   Collection Time: 04/11/15  4:00 AM  Result Value Ref Range Status   MRSA by PCR NEGATIVE NEGATIVE Final    Comment:        The GeneXpert MRSA Assay (FDA approved for NASAL specimens only), is one component of a comprehensive MRSA colonization surveillance program. It is not intended to diagnose MRSA infection nor to guide or monitor treatment for MRSA infections.     Radiology Reports Ct Abdomen Pelvis Wo Contrast  04/11/2015   CLINICAL DATA:  79 year old female with abdominal pain  EXAM: CT ABDOMEN AND PELVIS WITHOUT CONTRAST  TECHNIQUE: Multidetector CT imaging of the abdomen and pelvis was performed following the standard protocol without IV contrast.  COMPARISON:  CT dated 09/21/2010  FINDINGS: Evaluation of this exam is limited  in the absence of intravenous contrast. Evaluation is also limited due to streak artifact caused by patient's arms.  Mild emphysematous changes of the visualized lung bases. Bibasilar dependent atelectatic changes noted. No intra-abdominal free air or free fluid.  Cholecystectomy. The liver, pancreas, spleen, adrenal glands appear unremarkable. Small 2-3 mm nonobstructing right renal calculi. There is a stable 1  cm left renal upper pole hypodense lesion containing fat density most compatible with an angiomyolipoma. There is no hydronephrosis on either side. The visualized ureters and urinary bladder appear unremarkable. Hysterectomy.  Sigmoid diverticulosis without active inflammation. No evidence of bowel obstruction or inflammation. Normal appendix.  There is aortoiliac atherosclerotic disease. No portal venous gas. There is no lymphadenopathy. The abdominal wall soft tissues appear unremarkable. There is degenerative changes of the spine. No acute fractures.  IMPRESSION: No acute intra-abdominal or pelvic pathology.  No hydronephrosis or obstructing stone. Small nonobstructing right renal calculi and a stable fat containing left renal upper pole lesion most compatible with an angiomyolipoma.  Diverticulosis.  No evidence of bowel obstruction or inflammation.   Electronically Signed   By: Elgie Collard M.D.   On: 04/11/2015 01:20   Dg Chest 2 View  04/14/2015   CLINICAL DATA:  Dyspnea.  History of hypertension.  EXAM: CHEST  2 VIEW  COMPARISON:  03/16/2015  FINDINGS: The heart size and mediastinal contours are within normal limits. Both lungs are clear. The visualized skeletal structures are unremarkable.  IMPRESSION: No active cardiopulmonary disease.   Electronically Signed   By: Elige Ko   On: 04/14/2015 14:09   Ct Head Wo Contrast  04/11/2015   CLINICAL DATA:  Weakness for 3 days. Headache. Stated history of fall.  EXAM: CT HEAD WITHOUT CONTRAST  CT CERVICAL SPINE WITHOUT CONTRAST  TECHNIQUE:  Multidetector CT imaging of the head and cervical spine was performed following the standard protocol without intravenous contrast. Multiplanar CT image reconstructions of the cervical spine were also generated.  COMPARISON:  Head CT 03/16/2015  FINDINGS: CT HEAD FINDINGS  Unchanged atrophy and chronic small vessel ischemia.No intracranial hemorrhage, mass effect, or midline shift. No hydrocephalus. The basilar cisterns are patent. No evidence of territorial infarct. No intracranial fluid collection. Calvarium is intact. Included paranasal sinuses and mastoid air cells are well aerated.  CT CERVICAL SPINE FINDINGS  There is no fracture or acute subluxation. The dens is intact. There are no jumped or perched facets. Disc space narrowing and endplate spurring at C4-C5, C5-C6, and C6-C7. Scattered facet arthropathy. No prevertebral soft tissue edema.  IMPRESSION: 1.  No acute intracranial abnormality. 2. No acute bony abnormality of the cervical spine. Mild multilevel degenerative change.   Electronically Signed   By: Rubye Oaks M.D.   On: 04/11/2015 01:15   Ct Cervical Spine Wo Contrast  04/11/2015   CLINICAL DATA:  Weakness for 3 days. Headache. Stated history of fall.  EXAM: CT HEAD WITHOUT CONTRAST  CT CERVICAL SPINE WITHOUT CONTRAST  TECHNIQUE: Multidetector CT imaging of the head and cervical spine was performed following the standard protocol without intravenous contrast. Multiplanar CT image reconstructions of the cervical spine were also generated.  COMPARISON:  Head CT 03/16/2015  FINDINGS: CT HEAD FINDINGS  Unchanged atrophy and chronic small vessel ischemia.No intracranial hemorrhage, mass effect, or midline shift. No hydrocephalus. The basilar cisterns are patent. No evidence of territorial infarct. No intracranial fluid collection. Calvarium is intact. Included paranasal sinuses and mastoid air cells are well aerated.  CT CERVICAL SPINE FINDINGS  There is no fracture or acute subluxation. The  dens is intact. There are no jumped or perched facets. Disc space narrowing and endplate spurring at C4-C5, C5-C6, and C6-C7. Scattered facet arthropathy. No prevertebral soft tissue edema.  IMPRESSION: 1.  No acute intracranial abnormality. 2. No acute bony abnormality of the cervical spine. Mild multilevel degenerative change.   Electronically Signed  By: Rubye Oaks M.D.   On: 04/11/2015 01:15     CBC  Recent Labs Lab 04/10/15 2101 04/11/15 0400 04/12/15 0440  WBC 6.3 9.6 5.4  HGB 13.2 13.4 11.3*  HCT 38.3 38.3 33.2*  PLT 225 PLATELET CLUMPS NOTED ON SMEAR, COUNT APPEARS ADEQUATE 203  MCV 90.8 90.5 89.7  MCH 31.3 31.7 30.5  MCHC 34.5 35.0 34.0  RDW 12.6 12.8 12.4    Chemistries   Recent Labs Lab 04/10/15 2101 04/11/15 0400 04/12/15 0440 04/13/15 0515 04/14/15 0632  NA 134* 135 136 136 138  K 3.0* 3.8 2.6* 3.5 4.1  CL 97* 99* 103 103 104  CO2 21* 21* GLUCOSE 136* 100* 103* 109* 110*  BUN 24* 21* 8 <5* 8  CREATININE 2.01* 1.63* 0.73 0.63 0.73  CALCIUM 10.4* 9.8 9.0 9.6 10.2  MG  --   --  1.2* 1.3* 1.7  AST 23  --   --   --   --   ALT 22  --   --   --   --   ALKPHOS 81  --   --   --   --   BILITOT 1.0  --   --   --   --    ------------------------------------------------------------------------------------------------------------------ estimated creatinine clearance is 50.9 mL/min (by C-G formula based on Cr of 0.73). ------------------------------------------------------------------------------------------------------------------ No results for input(s): HGBA1C in the last 72 hours. ------------------------------------------------------------------------------------------------------------------ No results for input(s): CHOL, HDL, LDLCALC, TRIG, CHOLHDL, LDLDIRECT in the last 72 hours. ------------------------------------------------------------------------------------------------------------------ No results for input(s): TSH, T4TOTAL, T3FREE,  THYROIDAB in the last 72 hours.  Invalid input(s): FREET3 ------------------------------------------------------------------------------------------------------------------ No results for input(s): VITAMINB12, FOLATE, FERRITIN, TIBC, IRON, RETICCTPCT in the last 72 hours.  Coagulation profile No results for input(s): INR, PROTIME in the last 168 hours.  No results for input(s): DDIMER in the last 72 hours.  Cardiac Enzymes No results for input(s): CKMB, TROPONINI, MYOGLOBIN in the last 168 hours.  Invalid input(s): CK ------------------------------------------------------------------------------------------------------------------ Invalid input(s): POCBNP     Time Spent in minutes   20 minutes   ELGERGAWY, DAWOOD M.D on 04/16/2015 at 2:51 PM  Between 7am to 7pm - Pager - [phone]  After 7pm go to [email or web address] - password [facility]  [facility]   Office  [phone]

## 2015-04-16 NOTE — Progress Notes (Signed)
Physical Therapy Treatment Patient Details Name: [name] [name] MRN: [id number] DOB: Feb 25, 1936 Today's Date: 04/16/2015    History of Present Illness Patient is a 79 yo female admitted 04/10/15 from ALF with weakness, UTI, AKI.  PMH:  advanced dementia, depression, anxiety, HTN    PT Comments    Noting decr activity tolerance today; tendency to lean R and posteriorly which is significantly effecting transfers; needing knees blocked for stability with transfers today; unsafe to walk today  Follow Up Recommendations  SNF (I prefer for pt's with dementia to dc home to their familiar environment if at all possible -- but I am not sure that an ALF will be able to meet Ms. Musgrave' mobility needs at this functional level)     Equipment Recommendations  None recommended by PT    Recommendations for Other Services       Precautions / Restrictions Precautions Precautions: Fall Restrictions Weight Bearing Restrictions: No    Mobility  Bed Mobility Overal bed mobility: Needs Assistance Bed Mobility: Supine to Sit     Supine to sit: Max assist     General bed mobility comments: Verbal and tactile cues to move to EOB.  Patient required assist using bed pad to scoot to EOB.  Required max assist to elevate trunk to sitting, and once up to sitting EOB, pt with significant R lean  Transfers Overall transfer level: Needs assistance Equipment used: 1 person hand held assist;Rolling walker (2 wheeled) Transfers: Sit to/from [employer] Sit to Stand: Mod assist Stand pivot transfers: Max assist       General transfer comment: heavy mod assist with use of gait belt and knees blocked with initial sit to stand; Noted appropriate LE muscle activation in response to center of mass being shifted over feet; less success with with to stand attempt with RW -- unable to block knees to help with stability transfer; removed RW, and basic pivoted bed to chair with max assist and knees  blocked  Ambulation/Gait             General Gait Details: Unable today   Stairs            Wheelchair Mobility    Modified Rankin (Stroke Patients Only)       Balance             Standing balance-Leahy Scale: Zero                      Cognition Arousal/Alertness: Awake/alert Behavior During Therapy: Anxious Overall Cognitive Status: History of cognitive impairments - at baseline Area of Impairment: Orientation;Attention;Memory;Safety/judgement;Awareness;Problem solving Orientation Level: Disoriented to;Place;Time;Situation Current Attention Level: Focused Memory: Decreased short-term memory   Safety/Judgement: Decreased awareness of safety   Problem Solving: Slow processing;Difficulty sequencing;Requires verbal cues General Comments: Advanced dementia per chart.    Exercises      General Comments        Pertinent Vitals/Pain Pain Assessment: No/denies pain    Home Living                      Prior Function            PT Goals (current goals can now be found in the care plan section) Acute Rehab PT Goals Patient Stated Goal: Unable to state, but interested in getting up PT Goal Formulation: Patient unable to participate in goal setting Time For Goal Achievement: 04/26/15 Potential to Achieve Goals: Fair Progress towards PT goals: Not progressing  toward goals - comment (needing max assist for transfers)    Frequency  Min 2X/week    PT Plan Discharge plan needs to be updated    Co-evaluation             End of Session Equipment Utilized During Treatment: Gait belt Activity Tolerance: Patient limited by fatigue Patient left: in chair;with call bell/phone within reach;with chair alarm set     Time: 1610-9604 PT Time Calculation (min) (ACUTE ONLY): 24 min  Charges:  $Therapeutic Activity: 23-37 mins                    G Codes:      Olen Pel 04/16/2015, 2:21 PM  [place], [place]  Acute  Rehabilitation Services Pager [phone] Office [phone]

## 2015-04-16 NOTE — Progress Notes (Signed)
Speech Language Pathology Treatment: Dysphagia  Patient Details Name: [name] [name] MRN: [id number] DOB: [birth year] Today's Date: 04/16/2015 Time: 4098-1191 SLP Time Calculation (min) (ACUTE ONLY): 8 min  Assessment / Plan / Recommendation Clinical Impression  Pt appears anxious and tearful upon SLP arrival, but calms easily with emotional support and redirection. Pt consumed thin liquids via straw without overt signs of aspiration across challenging with large, consecutive straw sips. Pt declined solid POs despite encouragement from SLP. Will continue to follow to attempt reassessment of solid textures to determine if advancements can be made.   HPI Other Pertinent Information: [name] [name] is a 79 y.o. female with a PMH significant for depression, advanced dementia, HTN, and anxiety who presented to the ED on 04/10/15 after complaints of becoming progressively smore sleep and weak for four days prior to admission, per SNF staff. CT head indicated no acute intracranial abnormality, Pt's current complaints are acute kidney injury and weakness. Pt's diet is currently regular solid, thin liquid diet.    Pertinent Vitals Pain Assessment: No/denies pain  SLP Plan  Continue with current plan of care    Recommendations Diet recommendations: Dysphagia 2 (fine chop);Thin liquid Liquids provided via: Cup;Straw Medication Administration: Crushed with puree Supervision: Patient able to self feed;Full supervision/cueing for compensatory strategies Compensations: Small sips/bites;Check for pocketing;Slow rate;Minimize environmental distractions Postural Changes and/or Swallow Maneuvers: Seated upright 90 degrees       Oral Care Recommendations: Oral care BID Follow up Recommendations: Other (comment) (SLP f/u during inpt psych stay) Plan: Continue with current plan of care    Maxcine Ham, M.A. CCC-SLP [phone]  Maxcine Ham 04/16/2015, 10:55 AM

## 2015-04-16 NOTE — Consult Note (Signed)
[facility] Face-to-Face Psychiatry Consult Follow Up  Reason for Consult:  Dementia and severe depression Referring Physician:  Dr. Silas Sacramento Patient Identification: [name] [name] MRN:  [id number] Principal Diagnosis: Dementia with behavioral disturbance Diagnosis:   Patient Active Problem List   Diagnosis Date Noted  . Acute kidney injury [N17.9] 04/11/2015  . Renal insufficiency [N28.9] 04/11/2015  . Delirium [R41.0] 03/17/2015  . Polypharmacy [Z79.899] 01/27/2015  . Dementia with behavioral disturbance [F03.91] 01/26/2015  . Hypokalemia [E87.6] 01/26/2015  . Pressure ulcer [L89.90] 01/25/2015  . Dehydration [E86.0] 01/24/2015  . UTI (lower urinary tract infection) [N39.0] 01/24/2015  . Metabolic encephalopathy [G93.41] 01/24/2015  . Hyponatremia [E87.1] 01/24/2015    Total Time spent with patient: 30 minutes  Subjective:   [name] [name] is a 79 y.o. female patient admitted with sedation.  HPI: [name] [name] is a 79 y.o. female , resident of [facility] nursing home admitted to [facility] with increased sedation. Psychiatric consultation requested for increased symptoms of depression, anxiety, behavior problems like screaming and yelling constantly. Patient appeared awake, alert but not oriented and also seems to be confused and repeatedly flip and flap for answers. Patient stated that she wanted to talk to physician who can help to control her depression, anxiety, nervousness, forgetful, being afraid the time. Patient daughter was not at bedside during this evaluation because she has been working and consequent after-hours to the hospital. Patient with a past medical history significant for depression, advanced dementia, hypertension, and anxiety who presents with weakness and new AKI. Patient has been taking lurasidone, Cymbalta, clonazepam and rivastigmine at the time admission to the hospital. [facility] ask psychiatric social service to contact patient daughter to get  the collateral information and possible recent medication changes made at nursing home.   HPI Elements:   Location:  Depression, anxiety, forgetfulness and scaphoid. Quality:  Poor. Severity:  Chronic versus acute. Timing:  Unknown and increased sedation and confusion. Duration:  Few days. Context:  Out-of-home placement and questionable medication management effectiveness.   Interval History: Patient seen face-to-face for psychiatric follow-up consultation, case discussed with the Dr. Randol Kern, patient daughter Bonita Quin and psychiatric social service regarding patient current mental health status and appropriate disposition plans. Patient appeared staying in her bed, awake, alert, oriented to time [place] person and situation. Patient is able to rollover in her bed and also able to walk few steps with the help of physical therapist. Patient has chronic complaints of feeling depressed, anxious and generalized body pains which are at baseline. Patient occasionally makes statements that she wanted to jump out of the window to seek attention from the people who is trying to ignore her because of constant attention seeking behavior. Patient has no history of self-injurious behavior, self harm or suicidal intent or plans. Patient has no evidence of psychosis. Patient is willing to be placed in assisted living facility but she wanted to make sure we believe communicate with them regarding her personal needs. She has been tolerating her medication management without adverse affects, eating all her meals without difficulty and sleeping well. Patient does not meet criteria for acute psychiatric hospitalization at this time and she will be referred to the assisted living facility. Reportedly patient has been stable medically but will provide physical therapy visit today to see her mobility needs. She has been receiving medication management from in house provider from, [facility], Main office in [place], [place]  [phone] or [phone].: Provider has been: Drucilla Schmidt, NP.   Past Medical History:  Past Medical History  Diagnosis Date  . Dementia   . Anxiety   . MDD (major depressive disorder)   . Hypertension   . Head ache   . Tremor   . Osteoarthritis     Past Surgical History  Procedure Laterality Date  . Abdominal hysterectomy    . Hemorroidectomy    . Pterygium excision    . Foot surgery      right   Family History: History reviewed. No pertinent family history. Social History:  History  Alcohol Use No     History  Drug Use No    Social History   Social History  . Marital Status: Widowed    Spouse Name: N/A  . Number of Children: N/A  . Years of Education: N/A   Social History Main Topics  . Smoking status: Current Every Day Smoker    Start date: 01/24/2015  . Smokeless tobacco: None  . Alcohol Use: No  . Drug Use: No  . Sexual Activity: Not Asked   Other Topics Concern  . None   Social History Narrative   Additional Social History:                          Allergies:   Allergies  Allergen Reactions  . Abilify [Aripiprazole] Other (See Comments)    Caused pt to not be able to walk and was unable to care for herself     Labs:  No results found for this or any previous visit (from the past 48 hour(s)).  Vitals: Blood pressure 118/58, pulse 86, temperature 98 F (36.7 C), temperature source Oral, resp. rate 18, height  (1.626 m), weight 56.5 kg (124 lb 9 oz), SpO2 98 %.  Risk to Self: Is patient at risk for suicide?: No Risk to Others:   Prior Inpatient Therapy:   Prior Outpatient Therapy:    Current Facility-Administered Medications  Medication Dose Route Frequency Provider Last Rate Last Dose  . acetaminophen (TYLENOL) tablet 650 mg  650 mg Oral Q4H PRN Roma Kayser Schorr, NP   650 mg at 04/15/15 0550  . benztropine (COGENTIN) tablet 0.5 mg  0.5 mg Oral BID Leata Mouse, MD   0.5 mg at 04/16/15 1040  . clonazePAM  (KLONOPIN) tablet 1 mg  1 mg Oral TID Leata Mouse, MD   1 mg at 04/16/15 1040  . cycloSPORINE (RESTASIS) 0.05 % ophthalmic emulsion 1 drop  1 drop Both Eyes BID Alberteen Sam, MD   1 drop at 04/16/15 1043  . docusate sodium (COLACE) capsule 100 mg  100 mg Oral BID Alberteen Sam, MD   100 mg at 04/16/15 1040  . DULoxetine (CYMBALTA) DR capsule 120 mg  120 mg Oral Daily Leata Mouse, MD   120 mg at 04/16/15 1039  . enoxaparin (LOVENOX) injection 40 mg  40 mg Subcutaneous Q24H Alberteen Sam, MD   40 mg at 04/16/15 1044  . magnesium oxide (MAG-OX) tablet 400 mg  400 mg Oral BID Starleen Arms, MD   400 mg at 04/16/15 1040  . pantoprazole (PROTONIX) EC tablet 40 mg  40 mg Oral Daily Alberteen Sam, MD   40 mg at 04/16/15 1040  . potassium chloride SA (K-DUR,KLOR-CON) CR tablet 20 mEq  20 mEq Oral Daily Starleen Arms, MD   20 mEq at 04/16/15 1040  . risperiDONE (RISPERDAL) tablet 1 mg  1 mg Oral BID Leata Mouse, MD  1 mg at 04/16/15 1040  . rivastigmine (EXELON) capsule 3 mg  3 mg Oral BID WC Leata Mouse, MD   3 mg at 04/16/15 1040    Musculoskeletal: Strength & Muscle Tone: decreased Gait & Station: unable to stand Patient leans: N/A  Psychiatric Specialty Exam: Physical Exam as per history and physical   ROS confusion, forgetfulness, dehydration, UTI, nervousness, anxiousness, depression. Patient denies nausea, vomiting, shortness of breath and chest pain   Blood pressure 118/58, pulse 86, temperature 98 F (36.7 C), temperature source Oral, resp. rate 18, height  (1.626 m), weight 56.5 kg (124 lb 9 oz), SpO2 98 %.Body mass index is 21.37 kg/(m^2).  General Appearance: Guarded  Eye Contact::  Good  Speech:  Blocked and Clear and Coherent  Volume:  Normal  Mood:  Anxious and Depressed  Affect:  Appropriate, Non-Congruent, Inappropriate and Labile  Thought Process:  Irrelevant and Loose  Orientation:   Full (Time, Place, and Person)  Thought Content:  Rumination  Suicidal Thoughts:  No  Homicidal Thoughts:  No  Memory:  Immediate;   Fair Recent;   Poor  Judgement:  Fair  Insight:  Shallow  Psychomotor Activity:  Decreased  Concentration:  Poor  Recall:  Poor  Fund of Knowledge:Fair  Language: Fair  Akathisia:  Negative  Handed:  Right  AIMS (if indicated):     Assets:  Desire for Improvement Financial Resources/Insurance Leisure Time Physical Health Social Support Transportation  ADL's:  Impaired  Cognition: Impaired,  Moderate  Sleep:      Medical Decision Making: Review of Psycho-Social Stressors (1), Review or order clinical lab tests (1), Established Problem, Worsening (2), Review of Last Therapy Session (1), Review or order medicine tests (1), Review of Medication Regimen & Side Effects (2) and Review of New Medication or Change in Dosage (2)  Treatment Plan Summary: Patient has chronic symptoms of depression, anxiety, and 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 Patient appeared calm and cooperative and denies current symptoms of suicide or homicide ideation. She has no evidence of psychosis  Daily contact with patient to assess and evaluate symptoms and progress in treatment and Medication management  Plan:  I have discussed the case with the patient daughter Bonita Quin, hospital psychiatric social service Made a phone call to reach patient psychiatric nurse practitioner Criselda Peaches at [phone] without success Patient has safety concerns  Continue risperidone 1 mg BID - behaviors associated with dementia Continue clonazepam 1 mg 3 times daily for anxiety Continue Cymbalta 120 mg daily morning for depression Continue Exelon 3 mg twice daily for dementia Patient does not meet criteria for psychiatric inpatient admission. Supportive therapy provided about ongoing stressors. Appreciate psychiatric consultation and we sign off at this time Please contact [phone] or [phone]  if needs further assistance  Disposition: Patient will be referred to assisted living facility where she has been living for the last 3 and half years.   Patient is receiving in house mental health provider at ALF - [facility], Main office in [place], [place], [phone]. Provider has been: Drucilla Schmidt, NP  Darrol Jump R. 04/16/2015 12:53 PM Value

## 2015-04-16 NOTE — Care Management Important Message (Signed)
Important Message  Patient Details  Name: [name] [name] MRN: [id number] Date of Birth: October 22, 1935   Medicare Important Message Given:  Yes-second notification given    Orson Aloe 04/16/2015, 10:06 AM

## 2015-04-17 MED ORDER — CLONAZEPAM 1 MG PO TABS: 1.0000 mg | ORAL_TABLET | Freq: Three times a day (TID) | ORAL | Status: DC

## 2015-04-17 MED ORDER — DULOXETINE HCL 60 MG PO CPEP: 120.0000 mg | ORAL_CAPSULE | Freq: Every day | ORAL | Status: DC

## 2015-04-17 MED ORDER — BENZTROPINE MESYLATE 0.5 MG PO TABS: 0.5000 mg | ORAL_TABLET | Freq: Two times a day (BID) | ORAL | Status: AC

## 2015-04-17 MED ORDER — RISPERIDONE 1 MG PO TABS: 1.0000 mg | ORAL_TABLET | Freq: Two times a day (BID) | ORAL | Status: DC

## 2015-04-17 NOTE — Progress Notes (Signed)
Received call from patients daughter stating she received a phone call from Dr. Seth Bake yesterday but was unable to connect with him. Also stated that she called to speak with SW yesterday and did not receive a call back. Informed dtr that we would notify Dr. Seth Bake and Erie Noe of request for return call today. Caple, Charlyne Quale

## 2015-04-17 NOTE — Clinical Social Work Note (Signed)
Patient's daughter, Dorothey Baseman [phone]) provided with facility responses today. She requested that patient return to [facility]. Contact made with [facility] after 5 pm and request made to speak with nursing director. CSW advised that no one there to receive patient, everyone has left for the day. MD notified that patient will  d/c back to facility on Wednesday, 9/21.  Attending MD notified.   Genelle Bal, MSW, LCSW Licensed Clinical Social Worker Clinical Social Work Department [employer] [phone]

## 2015-04-17 NOTE — Care Management Note (Signed)
Case Management Note  Patient Details  Name: Renae Mottley MRN: [id number] Date of Birth: [birth year]  Subjective/Objective:           CM following for progression and d/c planning.         Action/Plan: Pt adm from ALF, plan now is to d/c to SNF. CSW working with pt and daughter.   Expected Discharge Date:  04/17/2015             Expected Discharge Plan:  Skilled Nursing Facility  In-House Referral:  Clinical Social Work  Discharge planning Services  NA  Post Acute Care Choice:  NA Choice offered to:  NA  DME Arranged:    DME Agency:     HH Arranged:    HH Agency:     Status of Service:  Completed, signed off  Medicare Important Message Given:  Yes-second notification given Date Medicare IM Given:    Medicare IM give by:    Date Additional Medicare IM Given:    Additional Medicare Important Message give by:     If discussed at Long Length of Stay Meetings, dates discussed:    Additional Comments:  Starlyn Skeans, RN 04/17/2015, 12:14 PM

## 2015-04-17 NOTE — Care Management Important Message (Signed)
Important Message  Patient Details  Name: Cyndy Braver MRN: [id number] Date of Birth: 1935/08/20   Medicare Important Message Given:  Yes-third notification given    RoyalAnnamarie Major, RN 04/17/2015, 1:01 PM

## 2015-04-17 NOTE — Discharge Summary (Addendum)
[name] [name], is a 79 y.o. female  DOB [birth year]  MRN [id number].  Admission date:  04/10/2015  Admitting Physician  Alberteen Sam, MD  Discharge Date:  04/20/2015   Primary MD  [name], MD  Recommendations for primary care physician for things to follow:  -  Check labs including CBC, BMP in 3 days. - Patient's psychiatric medication has been adjusted, please see note below  Admission Diagnosis  UTI (lower urinary tract infection) [N39.0] Renal insufficiency [N28.9]   Discharge Diagnosis  UTI (lower urinary tract infection) [N39.0] Renal insufficiency [N28.9]    Principal Problem:   Dementia with behavioral disturbance Active Problems:   Dehydration   UTI (lower urinary tract infection)   Hyponatremia   Hypokalemia   Acute kidney injury   Renal insufficiency      Past Medical History  Diagnosis Date  . Dementia   . Anxiety   . MDD (major depressive disorder)   . Hypertension   . Head ache   . Tremor   . Osteoarthritis     Past Surgical History  Procedure Laterality Date  . Abdominal hysterectomy    . Hemorroidectomy    . Pterygium excision    . Foot surgery      right       History of present illness and  Hospital Course:     Kindly see H&P for history of present illness and admission details, please review complete Labs, Consult reports and Test reports for all details in brief  HPI  from the history and physical done on the day of admission 04/11/2015  [name] [name] is a 79 y.o. female with a past medical history significant for depression, advanced dementia, hypertension, and anxiety who presents with weakness and new AKI.  History is obtained from EMS. Per nursing home staff, the patient had been progressively more sleepy and weak since four days ago. The patient endorsed neck pain to ER staff and abdominal pain to me. The abdominal pain is diffuse  but patient is unable to further characterize. NH did not report associated vomiting or diarrhea or change in oral intake.  In the ED, the patient was hemodynamically stable. A CT head was unremarkable and non-contrast CT of the abdomen was likewise unremarkable. She was given ceftriaxone for presumed UTI, K for hypokalemia, IV fluids for dehydration, and was noted to have an acute renal injury.  Hospital Course   Acute renal failure - Prerenal azotemia from dehydration - Continue to hold ARB and hydrochlorothiazide and NSAIDs(meloxicam) - Resolved with IV fluids.  UTI - received 1 dose of IV Rocephin in ED , Treated with oral fosfomycin X2.. - Urine culture growing Escherichia coli , patient in the past has urine culture growing enterococcus, and Streptococcus, this was discussed with Dr. Orvan Falconer from infectious disease, very likely this is related to contamination, and there is no indication for long-term antibiotics suppressive therapy.  Hypertension - Patient has been off her medication during that hospital stay, but pressure has been controlled, and actually on  the lower side,  so losartan/hydrochlorothiazide has been held on discharge especially with presentation of hyponatremia and acute renal failure on admission .  Hyponatremia - Secondary to volume depletion, resolved with IV fluid - Hydrochlorothiazide has been stopped  Hypokalemia/hypomagnesemia  - repleted,   QTc prolongation  - Repeat EKG was done, QTc resolved , repeat EKG 9/15 showing QTc of 443 >> 427(EKG in Epic posted on 9/8??). -PMD to continue monitor QTc, avoid QT prolonging agent.  depression and anxiety - Patient appears to be severely depressed, has mild to moderate baseline  dementia as well, psychiatric consult greatly appreciated, recommendation for inpatient psych admission for crisis stabilization and medication management  initially.  -Reevaluated 9/19, at this point they recommend discharge to  ALF versus SNF. -  was seen by psychiatry these changes has been made to her regimen   1- Discontinue Lurasidone due to not effective 2- Increase risperidone 0.5 mg BID - behavioral problems associated with the dementia 3- Increase clonazepam 1 mg 3 times daily for anxiety 4- Increase Cymbalta 120 mg daily morning for depression 5- Continue Exelon 3 mg twice daily for dementia   Chronic medical issues: Continue rivastigmine for dementia Continue topical cyclosporine for dry eyes Continue PPI     Discharge Condition: stable    Follow UP  Follow-up Information    Call [facility], MD.   Specialty:  Internal Medicine   Why:  To be seen in 3 days from discharge        Discharge Instructions  and  Discharge Medications         Discharge Instructions    Diet - low sodium heart healthy    Complete by:  As directed      Discharge instructions    Complete by:  As directed   Follow with Primary MD [name], MD in 7 days   Get CBC, CMP, 2 view Chest X ray checked  by Primary MD next visit.    Activity: Patient to be seen and evaluated by PT/OT   Disposition ALF   Diet: Heart Healthy  , with feeding assistance and aspiration precautions.  For Heart failure patients - Check your Weight same time everyday, if you gain over 2 pounds, or you develop in leg swelling, experience more shortness of breath or chest pain, call your Primary MD immediately. Follow Cardiac Low Salt Diet and 1.5 lit/day fluid restriction.   On your next visit with your primary care physician please Get Medicines reviewed and adjusted.   Please request your Prim.MD to go over all Hospital Tests and Procedure/Radiological results at the follow up, please get all Hospital records sent to your Prim MD by signing hospital release before you go home.   If you experience worsening of your admission symptoms, develop shortness of breath, life threatening emergency, suicidal or homicidal thoughts you  must seek medical attention immediately by calling 911 or calling your MD immediately  if symptoms less severe.  You Must read complete instructions/literature along with all the possible adverse reactions/side effects for all the Medicines you take and that have been prescribed to you. Take any new Medicines after you have completely understood and accpet all the possible adverse reactions/side effects.   Do not drive, operating heavy machinery, perform activities at heights, swimming or participation in water activities or provide baby sitting services if your were admitted for syncope or siezures until you have seen by Primary MD or a Neurologist and advised to do so again.  Do not  drive when taking Pain medications.    Do not take more than prescribed Pain, Sleep and Anxiety Medications  Special Instructions: If you have smoked or chewed Tobacco  in the last 2 yrs please stop smoking, stop any regular Alcohol  and or any Recreational drug use.  Wear Seat belts while driving.   Please note  You were cared for by a hospitalist during your hospital stay. If you have any questions about your discharge medications or the care you received while you were in the hospital after you are discharged, you can call the unit and asked to speak with the hospitalist on call if the hospitalist that took care of you is not available. Once you are discharged, your primary care physician will handle any further medical issues. Please note that NO REFILLS for any discharge medications will be authorized once you are discharged, as it is imperative that you return to your primary care physician (or establish a relationship with a primary care physician if you do not have one) for your aftercare needs so that they can reassess your need for medications and monitor your lab values.            Medication List    STOP taking these medications        losartan-hydrochlorothiazide 50-12.5 MG per tablet  Commonly  known as:  HYZAAR     lurasidone 40 MG Tabs tablet  Commonly known as:  LATUDA     meloxicam 15 MG tablet  Commonly known as:  MOBIC      TAKE these medications        acetaminophen 500 MG tablet  Commonly known as:  TYLENOL  Take 500 mg by mouth every 6 (six) hours as needed for moderate pain.     alendronate 70 MG tablet  Commonly known as:  FOSAMAX  Take 1 tablet (70 mg total) by mouth once a week. Take with a full glass of water on an empty stomach. Takes on Mondays     benztropine 0.5 MG tablet  Commonly known as:  COGENTIN  Take 1 tablet (0.5 mg total) by mouth 2 (two) times daily.     clonazePAM 1 MG tablet  Commonly known as:  KLONOPIN  Take 1 tablet (1 mg total) by mouth 3 (three) times daily.     cycloSPORINE 0.05 % ophthalmic emulsion  Commonly known as:  RESTASIS  Place 1 drop into both eyes 2 (two) times daily.     docusate sodium 100 MG capsule  Commonly known as:  COLACE  Take 100 mg by mouth 2 (two) times daily. For constipation     DULoxetine 60 MG capsule  Commonly known as:  CYMBALTA  Take 2 capsules (120 mg total) by mouth daily.     guaifenesin 100 MG/5ML syrup  Commonly known as:  ROBITUSSIN  Take 200 mg by mouth 4 (four) times daily as needed for cough.     loperamide 2 MG capsule  Commonly known as:  IMODIUM  Take 2 mg by mouth as needed for diarrhea or loose stools.     magnesium hydroxide 400 MG/5ML suspension  Commonly known as:  MILK OF MAGNESIA  Take 30 mLs by mouth daily as needed for mild constipation.     Melatonin 3 MG Tabs  Take 1 tablet by mouth at bedtime.     omeprazole 20 MG capsule  Commonly known as:  PRILOSEC  Take 1 capsule (20 mg total) by mouth daily after breakfast.  risperiDONE 1 MG tablet  Commonly known as:  RISPERDAL  Take 1 tablet (1 mg total) by mouth 2 (two) times daily.     rivastigmine 1.5 MG capsule  Commonly known as:  EXELON  Take 1.5 mg by mouth 2 (two) times daily.          Diet and  Activity recommendation: See Discharge Instructions above   Consults obtained -  Psychiatric    Major procedures and Radiology Reports - PLEASE review detailed and final reports for all details, in brief -      Ct Abdomen Pelvis Wo Contrast  04/11/2015   CLINICAL DATA:  79 year old female with abdominal pain  EXAM: CT ABDOMEN AND PELVIS WITHOUT CONTRAST  TECHNIQUE: Multidetector CT imaging of the abdomen and pelvis was performed following the standard protocol without IV contrast.  COMPARISON:  CT dated 09/21/2010  FINDINGS: Evaluation of this exam is limited in the absence of intravenous contrast. Evaluation is also limited due to streak artifact caused by patient's arms.  Mild emphysematous changes of the visualized lung bases. Bibasilar dependent atelectatic changes noted. No intra-abdominal free air or free fluid.  Cholecystectomy. The liver, pancreas, spleen, adrenal glands appear unremarkable. Small 2-3 mm nonobstructing right renal calculi. There is a stable 1 cm left renal upper pole hypodense lesion containing fat density most compatible with an angiomyolipoma. There is no hydronephrosis on either side. The visualized ureters and urinary bladder appear unremarkable. Hysterectomy.  Sigmoid diverticulosis without active inflammation. No evidence of bowel obstruction or inflammation. Normal appendix.  There is aortoiliac atherosclerotic disease. No portal venous gas. There is no lymphadenopathy. The abdominal wall soft tissues appear unremarkable. There is degenerative changes of the spine. No acute fractures.  IMPRESSION: No acute intra-abdominal or pelvic pathology.  No hydronephrosis or obstructing stone. Small nonobstructing right renal calculi and a stable fat containing left renal upper pole lesion most compatible with an angiomyolipoma.  Diverticulosis.  No evidence of bowel obstruction or inflammation.   Electronically Signed   By: Elgie Collard M.D.   On: 04/11/2015 01:20   Dg Chest  2 View  04/14/2015   CLINICAL DATA:  Dyspnea.  History of hypertension.  EXAM: CHEST  2 VIEW  COMPARISON:  03/16/2015  FINDINGS: The heart size and mediastinal contours are within normal limits. Both lungs are clear. The visualized skeletal structures are unremarkable.  IMPRESSION: No active cardiopulmonary disease.   Electronically Signed   By: Elige Ko   On: 04/14/2015 14:09   Ct Head Wo Contrast  04/11/2015   CLINICAL DATA:  Weakness for 3 days. Headache. Stated history of fall.  EXAM: CT HEAD WITHOUT CONTRAST  CT CERVICAL SPINE WITHOUT CONTRAST  TECHNIQUE: Multidetector CT imaging of the head and cervical spine was performed following the standard protocol without intravenous contrast. Multiplanar CT image reconstructions of the cervical spine were also generated.  COMPARISON:  Head CT 03/16/2015  FINDINGS: CT HEAD FINDINGS  Unchanged atrophy and chronic small vessel ischemia.No intracranial hemorrhage, mass effect, or midline shift. No hydrocephalus. The basilar cisterns are patent. No evidence of territorial infarct. No intracranial fluid collection. Calvarium is intact. Included paranasal sinuses and mastoid air cells are well aerated.  CT CERVICAL SPINE FINDINGS  There is no fracture or acute subluxation. The dens is intact. There are no jumped or perched facets. Disc space narrowing and endplate spurring at C4-C5, C5-C6, and C6-C7. Scattered facet arthropathy. No prevertebral soft tissue edema.  IMPRESSION: 1.  No acute intracranial abnormality. 2. No acute  bony abnormality of the cervical spine. Mild multilevel degenerative change.   Electronically Signed   By: Rubye Oaks M.D.   On: 04/11/2015 01:15   Ct Cervical Spine Wo Contrast  04/11/2015   CLINICAL DATA:  Weakness for 3 days. Headache. Stated history of fall.  EXAM: CT HEAD WITHOUT CONTRAST  CT CERVICAL SPINE WITHOUT CONTRAST  TECHNIQUE: Multidetector CT imaging of the head and cervical spine was performed following the standard  protocol without intravenous contrast. Multiplanar CT image reconstructions of the cervical spine were also generated.  COMPARISON:  Head CT 03/16/2015  FINDINGS: CT HEAD FINDINGS  Unchanged atrophy and chronic small vessel ischemia.No intracranial hemorrhage, mass effect, or midline shift. No hydrocephalus. The basilar cisterns are patent. No evidence of territorial infarct. No intracranial fluid collection. Calvarium is intact. Included paranasal sinuses and mastoid air cells are well aerated.  CT CERVICAL SPINE FINDINGS  There is no fracture or acute subluxation. The dens is intact. There are no jumped or perched facets. Disc space narrowing and endplate spurring at C4-C5, C5-C6, and C6-C7. Scattered facet arthropathy. No prevertebral soft tissue edema.  IMPRESSION: 1.  No acute intracranial abnormality. 2. No acute bony abnormality of the cervical spine. Mild multilevel degenerative change.   Electronically Signed   By: Rubye Oaks M.D.   On: 04/11/2015 01:15    Micro Results     Recent Results (from the past 240 hour(s))  Urine culture     Status: None   Collection Time: 04/10/15 11:10 PM  Result Value Ref Range Status   Specimen Description URINE, RANDOM  Final   Special Requests NONE  Final   Culture   Final    >=100,000 COLONIES/mL ESCHERICHIA COLI Confirmed Extended Spectrum Beta-Lactamase Producer (ESBL)    Report Status 04/13/2015 FINAL  Final   Organism ID, Bacteria ESCHERICHIA COLI  Final      Susceptibility   Escherichia coli - MIC*    AMPICILLIN >=32 RESISTANT Resistant     CEFAZOLIN >=64 RESISTANT Resistant     CEFTRIAXONE >=64 RESISTANT Resistant     CIPROFLOXACIN >=4 RESISTANT Resistant     GENTAMICIN >=16 RESISTANT Resistant     IMIPENEM <=0.25 SENSITIVE Sensitive     NITROFURANTOIN <=16 SENSITIVE Sensitive     TRIMETH/SULFA >=320 RESISTANT Resistant     AMPICILLIN/SULBACTAM >=32 RESISTANT Resistant     PIP/TAZO 8 SENSITIVE Sensitive     * >=100,000 COLONIES/mL  ESCHERICHIA COLI  MRSA PCR Screening     Status: None   Collection Time: 04/11/15  4:00 AM  Result Value Ref Range Status   MRSA by PCR NEGATIVE NEGATIVE Final    Comment:        The GeneXpert MRSA Assay (FDA approved for NASAL specimens only), is one component of a comprehensive MRSA colonization surveillance program. It is not intended to diagnose MRSA infection nor to guide or monitor treatment for MRSA infections.        Today   Subjective:   Aviella Disbrow today has No headache, No chest pain, No abdominal pain - No Nausea  Objective:   Blood pressure 112/56, pulse 88, temperature 98.4 F (36.9 C), temperature source Oral, resp. rate 20, height 5\' 4"  (1.626 m), weight 124 lb 9 oz (56.5 kg), SpO2 95 %.   Intake/Output Summary (Last 24 hours) at 04/20/15 1600 Last data filed at 04/20/15 0700  Gross per 24 hour  Intake    480 ml  Output    400 ml  Net  80 ml    Exam Awake , appears to be in a better mood, with less anxiety as well. Ashley.AT,PERRAL Supple Neck,No JVD,  Symmetrical Chest wall movement, Good air movement bilaterally, RRR,No Gallops,Rubs or new Murmurs,  +ve B.Sounds, Abd Soft, No tenderness, No organomegaly appriciated, No rebound - guarding or rigidity. No Cyanosis, Clubbing or edema, No new Rash or bruise   Data Review   CBC w Diff:  Lab Results  Component Value Date   WBC 8.6 04/19/2015   HGB 12.6 04/19/2015   HCT 38.6 04/19/2015   PLT 430* 04/19/2015   LYMPHOPCT 17 04/19/2015   MONOPCT 8 04/19/2015   EOSPCT 1 04/19/2015   BASOPCT 0 04/19/2015    CMP:  Lab Results  Component Value Date   NA 134* 04/19/2015   K 4.2 04/19/2015   CL 101 04/19/2015   CO2 25 04/19/2015   BUN 16 04/19/2015   CREATININE 0.81 04/19/2015   PROT 6.6 04/10/2015   ALBUMIN 3.5 04/10/2015   BILITOT 1.0 04/10/2015   ALKPHOS 81 04/10/2015   AST 23 04/10/2015   ALT 22 04/10/2015  .   Total Time in preparing paper work, data evaluation and todays  exam - 35 minutes  Xu,Fang MD PhD on 04/20/2015 at 4:00 PM  [facility]   Office  [phone]

## 2015-04-17 NOTE — Discharge Instructions (Signed)
Follow with Primary MD [name], MD in 7 days   Get CBC, CMP, 2 view Chest X ray checked  by Primary MD next visit.    Activity: Patient to be seen and evaluated by PT/OT   Disposition ALF   Diet: Heart Healthy  , with feeding assistance and aspiration precautions.  For Heart failure patients - Check your Weight same time everyday, if you gain over 2 pounds, or you develop in leg swelling, experience more shortness of breath or chest pain, call your Primary MD immediately. Follow Cardiac Low Salt Diet and 1.5 lit/day fluid restriction.   On your next visit with your primary care physician please Get Medicines reviewed and adjusted.   Please request your Prim.MD to go over all Hospital Tests and Procedure/Radiological results at the follow up, please get all Hospital records sent to your Prim MD by signing hospital release before you go home.   If you experience worsening of your admission symptoms, develop shortness of breath, life threatening emergency, suicidal or homicidal thoughts you must seek medical attention immediately by calling 911 or calling your MD immediately  if symptoms less severe.  You Must read complete instructions/literature along with all the possible adverse reactions/side effects for all the Medicines you take and that have been prescribed to you. Take any new Medicines after you have completely understood and accpet all the possible adverse reactions/side effects.   Do not drive, operating heavy machinery, perform activities at heights, swimming or participation in water activities or provide baby sitting services if your were admitted for syncope or siezures until you have seen by Primary MD or a Neurologist and advised to do so again.  Do not drive when taking Pain medications.    Do not take more than prescribed Pain, Sleep and Anxiety Medications  Special Instructions: If you have smoked or chewed Tobacco  in the last 2 yrs please stop smoking, stop  any regular Alcohol  and or any Recreational drug use.  Wear Seat belts while driving.   Please note  You were cared for by a hospitalist during your hospital stay. If you have any questions about your discharge medications or the care you received while you were in the hospital after you are discharged, you can call the unit and asked to speak with the hospitalist on call if the hospitalist that took care of you is not available. Once you are discharged, your primary care physician will handle any further medical issues. Please note that NO REFILLS for any discharge medications will be authorized once you are discharged, as it is imperative that you return to your primary care physician (or establish a relationship with a primary care physician if you do not have one) for your aftercare needs so that they can reassess your need for medications and monitor your lab values.

## 2015-04-18 DIAGNOSIS — F0391 Unspecified dementia with behavioral disturbance: Secondary | ICD-10-CM

## 2015-04-18 DIAGNOSIS — E876 Hypokalemia: Secondary | ICD-10-CM

## 2015-04-18 DIAGNOSIS — E860 Dehydration: Secondary | ICD-10-CM

## 2015-04-18 DIAGNOSIS — N289 Disorder of kidney and ureter, unspecified: Secondary | ICD-10-CM

## 2015-04-18 DIAGNOSIS — N179 Acute kidney failure, unspecified: Principal | ICD-10-CM

## 2015-04-18 NOTE — Progress Notes (Signed)
Patient Demographics  [name] [name], is a 79 y.o. female, DOB - 1935-08-31, ZOX:[id number]  Admit date - 04/10/2015   Admitting Physician Alberteen Sam, MD  Outpatient Primary MD for the patient is [name], MD  LOS - 7   Chief Complaint  Patient presents with  . Weakness         Subjective:   [name] [name] demented, not able to provide history, does not seem in acute distress.  Assessment & Plan    Principal Problem:   Dementia with behavioral disturbance Active Problems:   Dehydration   UTI (lower urinary tract infection)   Hyponatremia   Hypokalemia   Acute kidney injury   Renal insufficiency  Acute renal failure - Prerenal azotemia from dehydration - Continue to hold ARB and NSAIDs(meloxicam) -Resolved with IV fluids.  UTI -  received 1 dose of IV Rocephin in ED ,   Treated with oral fosfomycin X2.. - Urine culture growing Escherichia coli , patient in the past has urine culture growing enterococcus, and Streptococcus, this was discussed with Dr. Orvan Falconer from infectious disease, very likely this is related to contamination, and there is no indication for long-term antibiotics suppressive therapy.  Hyponatremia - Secondary to volume depletion, resolved with IV fluid  Hypokalemia/hypomagnesemia  -  repleted, recheck in a.m.   QTc prolongation  -  Repeat EKG was done, QTc resolved , repeat EKG 9/15 showing QTc of 443 >> 427(EKG in Epic posted on 9/8??).   depression and anxiety - Patient appears to be severely depressed, has baseline dementia as well, psychiatric consult greatly appreciated, recommendation for inpatient psych admission for crisis stabilization and medication management. Reevaluated 9/19, at this point they recommend discharge to ALF versus SNF. - Management as per psychiatry  Chronic medical issues: Continue rivastigmine for  dementia Continue topical cyclosporine for dry eyes Continue PPI  Code Status: Full   Family Communication: none at bedside, left message for her daughter 9/19  Disposition: For SNF on bed is available.   Procedures  none   Consults  Psychiatry   Medications  Scheduled Meds: . benztropine  0.5 mg Oral BID  . clonazePAM  1 mg Oral TID  . cycloSPORINE  1 drop Both Eyes BID  . docusate sodium  100 mg Oral BID  . DULoxetine  120 mg Oral Daily  . enoxaparin (LOVENOX) injection  40 mg Subcutaneous Q24H  . magnesium oxide  400 mg Oral BID  . pantoprazole  40 mg Oral Daily  . potassium chloride  20 mEq Oral Daily  . risperiDONE  1 mg Oral BID  . rivastigmine  3 mg Oral BID WC   Continuous Infusions:   PRN Meds:.  DVT Prophylaxis  Lovenox -  Lab Results  Component Value Date   PLT 203 04/12/2015   Antibiotics   Anti-infectives    Start     Dose/Rate Route Frequency Ordered Stop   04/11/15 0100  cefTRIAXone (ROCEPHIN) 1 g in dextrose 5 % 50 mL IVPB     1 g 100 mL/hr over 30 Minutes Intravenous  Once 04/11/15 0053 04/11/15 0148          Objective:   Filed Vitals:   04/17/15 2202 04/18/15 0453 04/18/15 1045 04/18/15 1753  BP: 131/64 125/57 157/64 148/59  Pulse: 81 84 84 80  Temp: 97.6 F (36.4 C) 98.3 F (36.8 C) 98.7 F (37.1 C) 98.2 F (36.8 C)  TempSrc: Oral Oral Oral Oral  Resp: Height:      Weight:      SpO2: 95% 98% 98% 98%    Wt Readings from Last 3 Encounters:  04/15/15 124 lb 9 oz (56.5 kg)  01/24/15 164 lb 4.8 oz (74.526 kg)     Intake/Output Summary (Last 24 hours) at 04/18/15 1846 Last data filed at 04/18/15 1754  Gross per 24 hour  Intake    480 ml  Output      0 ml  Net    480 ml     Physical Exam  Awake ,  appears to be in a better mood, with less anxiety as well. [place].AT,PERRAL Supple Neck,No JVD,  Symmetrical Chest wall movement, Good air movement bilaterally, RRR,No Gallops,Rubs or new Murmurs,  +ve  B.Sounds, Abd Soft, No tenderness, No organomegaly appriciated, No rebound - guarding or rigidity. No Cyanosis, Clubbing or edema, No new Rash or bruise    Data Review   Micro Results Recent Results (from the past 240 hour(s))  Urine culture     Status: None   Collection Time: 04/10/15 11:10 PM  Result Value Ref Range Status   Specimen Description URINE, RANDOM  Final   Special Requests NONE  Final   Culture   Final    >=100,000 COLONIES/mL ESCHERICHIA COLI Confirmed Extended Spectrum Beta-Lactamase Producer (ESBL)    Report Status 04/13/2015 FINAL  Final   Organism ID, Bacteria ESCHERICHIA COLI  Final      Susceptibility   Escherichia coli - MIC*    AMPICILLIN >=32 RESISTANT Resistant     CEFAZOLIN >=64 RESISTANT Resistant     CEFTRIAXONE >=64 RESISTANT Resistant     CIPROFLOXACIN >=4 RESISTANT Resistant     GENTAMICIN >=16 RESISTANT Resistant     IMIPENEM <=0.25 SENSITIVE Sensitive     NITROFURANTOIN <=16 SENSITIVE Sensitive     TRIMETH/SULFA >=320 RESISTANT Resistant     AMPICILLIN/SULBACTAM >=32 RESISTANT Resistant     PIP/TAZO 8 SENSITIVE Sensitive     * >=100,000 COLONIES/mL ESCHERICHIA COLI  MRSA PCR Screening     Status: None   Collection Time: 04/11/15  4:00 AM  Result Value Ref Range Status   MRSA by PCR NEGATIVE NEGATIVE Final    Comment:        The GeneXpert MRSA Assay (FDA approved for NASAL specimens only), is one component of a comprehensive MRSA colonization surveillance program. It is not intended to diagnose MRSA infection nor to guide or monitor treatment for MRSA infections.     Radiology Reports Ct Abdomen Pelvis Wo Contrast  04/11/2015   CLINICAL DATA:  79 year old female with abdominal pain  EXAM: CT ABDOMEN AND PELVIS WITHOUT CONTRAST  TECHNIQUE: Multidetector CT imaging of the abdomen and pelvis was performed following the standard protocol without IV contrast.  COMPARISON:  CT dated 09/21/2010  FINDINGS: Evaluation of this exam is limited  in the absence of intravenous contrast. Evaluation is also limited due to streak artifact caused by patient's arms.  Mild emphysematous changes of the visualized lung bases. Bibasilar dependent atelectatic changes noted. No intra-abdominal free air or free fluid.  Cholecystectomy. The liver, pancreas, spleen, adrenal glands appear unremarkable. Small 2-3 mm nonobstructing right renal calculi. There is a stable 1 cm left renal upper pole hypodense lesion  containing fat density most compatible with an angiomyolipoma. There is no hydronephrosis on either side. The visualized ureters and urinary bladder appear unremarkable. Hysterectomy.  Sigmoid diverticulosis without active inflammation. No evidence of bowel obstruction or inflammation. Normal appendix.  There is aortoiliac atherosclerotic disease. No portal venous gas. There is no lymphadenopathy. The abdominal wall soft tissues appear unremarkable. There is degenerative changes of the spine. No acute fractures.  IMPRESSION: No acute intra-abdominal or pelvic pathology.  No hydronephrosis or obstructing stone. Small nonobstructing right renal calculi and a stable fat containing left renal upper pole lesion most compatible with an angiomyolipoma.  Diverticulosis.  No evidence of bowel obstruction or inflammation.   Electronically Signed   By: Elgie Collard M.D.   On: 04/11/2015 01:20   Dg Chest 2 View  04/14/2015   CLINICAL DATA:  Dyspnea.  History of hypertension.  EXAM: CHEST  2 VIEW  COMPARISON:  03/16/2015  FINDINGS: The heart size and mediastinal contours are within normal limits. Both lungs are clear. The visualized skeletal structures are unremarkable.  IMPRESSION: No active cardiopulmonary disease.   Electronically Signed   By: Elige Ko   On: 04/14/2015 14:09   Ct Head Wo Contrast  04/11/2015   CLINICAL DATA:  Weakness for 3 days. Headache. Stated history of fall.  EXAM: CT HEAD WITHOUT CONTRAST  CT CERVICAL SPINE WITHOUT CONTRAST  TECHNIQUE:  Multidetector CT imaging of the head and cervical spine was performed following the standard protocol without intravenous contrast. Multiplanar CT image reconstructions of the cervical spine were also generated.  COMPARISON:  Head CT 03/16/2015  FINDINGS: CT HEAD FINDINGS  Unchanged atrophy and chronic small vessel ischemia.No intracranial hemorrhage, mass effect, or midline shift. No hydrocephalus. The basilar cisterns are patent. No evidence of territorial infarct. No intracranial fluid collection. Calvarium is intact. Included paranasal sinuses and mastoid air cells are well aerated.  CT CERVICAL SPINE FINDINGS  There is no fracture or acute subluxation. The dens is intact. There are no jumped or perched facets. Disc space narrowing and endplate spurring at C4-C5, C5-C6, and C6-C7. Scattered facet arthropathy. No prevertebral soft tissue edema.  IMPRESSION: 1.  No acute intracranial abnormality. 2. No acute bony abnormality of the cervical spine. Mild multilevel degenerative change.   Electronically Signed   By: Rubye Oaks M.D.   On: 04/11/2015 01:15   Ct Cervical Spine Wo Contrast  04/11/2015   CLINICAL DATA:  Weakness for 3 days. Headache. Stated history of fall.  EXAM: CT HEAD WITHOUT CONTRAST  CT CERVICAL SPINE WITHOUT CONTRAST  TECHNIQUE: Multidetector CT imaging of the head and cervical spine was performed following the standard protocol without intravenous contrast. Multiplanar CT image reconstructions of the cervical spine were also generated.  COMPARISON:  Head CT 03/16/2015  FINDINGS: CT HEAD FINDINGS  Unchanged atrophy and chronic small vessel ischemia.No intracranial hemorrhage, mass effect, or midline shift. No hydrocephalus. The basilar cisterns are patent. No evidence of territorial infarct. No intracranial fluid collection. Calvarium is intact. Included paranasal sinuses and mastoid air cells are well aerated.  CT CERVICAL SPINE FINDINGS  There is no fracture or acute subluxation. The  dens is intact. There are no jumped or perched facets. Disc space narrowing and endplate spurring at C4-C5, C5-C6, and C6-C7. Scattered facet arthropathy. No prevertebral soft tissue edema.  IMPRESSION: 1.  No acute intracranial abnormality. 2. No acute bony abnormality of the cervical spine. Mild multilevel degenerative change.   Electronically Signed   By: Lujean Rave.D.  On: 04/11/2015 01:15     CBC  Recent Labs Lab 04/12/15 0440  WBC 5.4  HGB 11.3*  HCT 33.2*  PLT 203  MCV 89.7  MCH 30.5  MCHC 34.0  RDW 12.4    Chemistries   Recent Labs Lab 04/12/15 0440 04/13/15 0515 04/14/15 0632  NA 136 136 138  K 2.6* 3.5 4.1  CL 103 103 104  CO2 GLUCOSE 103* 109* 110*  BUN 8 <5* 8  CREATININE 0.73 0.63 0.73  CALCIUM 9.0 9.6 10.2  MG 1.2* 1.3* 1.7   ------------------------------------------------------------------------------------------------------------------ estimated creatinine clearance is 50.9 mL/min (by C-G formula based on Cr of 0.73). ------------------------------------------------------------------------------------------------------------------ No results for input(s): HGBA1C in the last 72 hours. ------------------------------------------------------------------------------------------------------------------ No results for input(s): CHOL, HDL, LDLCALC, TRIG, CHOLHDL, LDLDIRECT in the last 72 hours. ------------------------------------------------------------------------------------------------------------------ No results for input(s): TSH, T4TOTAL, T3FREE, THYROIDAB in the last 72 hours.  Invalid input(s): FREET3 ------------------------------------------------------------------------------------------------------------------ No results for input(s): VITAMINB12, FOLATE, FERRITIN, TIBC, IRON, RETICCTPCT in the last 72 hours.  Coagulation profile No results for input(s): INR, PROTIME in the last 168 hours.  No results for input(s): DDIMER in  the last 72 hours.  Cardiac Enzymes No results for input(s): CKMB, TROPONINI, MYOGLOBIN in the last 168 hours.  Invalid input(s): CK ------------------------------------------------------------------------------------------------------------------ Invalid input(s): POCBNP     Time Spent in minutes   20 minutes   Xu,Fang MD PhD on 04/18/2015 at 6:46 PM  Between 7am to 7pm - Pager - [phone]  After 7pm go to [email or web address] - password [facility]  [facility]   Office  [phone]

## 2015-04-18 NOTE — Progress Notes (Signed)
Speech Language Pathology Treatment: Dysphagia  Patient Details Name: [name] [name] MRN: [id number] DOB: 01-15-1936 Today's Date: 04/18/2015 Time: 4696-2952 SLP Time Calculation (min) (ACUTE ONLY): 12 min  Assessment / Plan / Recommendation Clinical Impression  Pt seen with advanced solid textures to assess readiness to advance. Pt had moderate right-sided buccal pocketing with regular textures, requiring Mod verbal, tactile, and visual cues from SLP for improved clearance. No overt signs of aspiration observed. Given the above, coupled with pt's altered mentation, would continue with Dys 2 diet and thin liquids at this time.   HPI Other Pertinent Information: [name] [name] is a 79 y.o. female with a PMH significant for depression, advanced dementia, HTN, and anxiety who presented to the ED on 04/10/15 after complaints of becoming progressively smore sleep and weak for four days prior to admission, per SNF staff. CT head indicated no acute intracranial abnormality, Pt's current complaints are acute kidney injury and weakness. Pt's diet is currently regular solid, thin liquid diet.    Pertinent Vitals Pain Assessment: No/denies pain  SLP Plan  Continue with current plan of care    Recommendations Diet recommendations: Dysphagia 2 (fine chop);Thin liquid Liquids provided via: Cup;Straw Medication Administration: Crushed with puree Supervision: Patient able to self feed;Full supervision/cueing for compensatory strategies Compensations: Small sips/bites;Check for pocketing;Slow rate;Minimize environmental distractions Postural Changes and/or Swallow Maneuvers: Seated upright 90 degrees       Oral Care Recommendations: Oral care BID Follow up Recommendations: Skilled Nursing facility Plan: Continue with current plan of care   Maxcine Ham, M.A. CCC-SLP [phone]  Maxcine Ham 04/18/2015, 1:42 PM

## 2015-04-18 NOTE — Progress Notes (Signed)
Physical Therapy Treatment Patient Details Name: [name] [name] MRN: [id number] DOB: [birth year] Today's Date: 04/18/2015    History of Present Illness Patient is a 79 yo female admitted 04/10/15 from ALF with weakness, UTI, AKI.  PMH:  advanced dementia, depression, anxiety, HTN    PT Comments    Continued weakness and fatigue limiting mobility; Requires max assist and knees blocked for safety and stability with standing attempts; verbal and tactile cueing to initiate sit to stand with forward lean  Follow Up Recommendations  SNF     Equipment Recommendations  None recommended by PT    Recommendations for Other Services       Precautions / Restrictions Precautions Precautions: Fall    Mobility  Bed Mobility Overal bed mobility: Needs Assistance Bed Mobility: Supine to Sit     Supine to sit: Mod assist     General bed mobility comments: Verbal and tactile cues to move to EOB.  Patient required assist using bed pad to scoot to EOB.  Required heavy mod assist to elevate trunk to sitting, and once up to sitting EOB, pt with significant R lean  Transfers Overall transfer level: Needs assistance Equipment used: 2 person hand held assist (and use of gait belt) Transfers: Sit to/from Stand;Stand Pivot Transfers Sit to Stand: +2 physical assistance;Max assist Stand pivot transfers: +2 physical assistance;Max assist       General transfer comment: +2 max assist with use of gait belt and knees blocked with initial sit to stand; Noted appropriate LE muscle activation in response to center of mass being shifted over feet; less success with with to stand attempt with RW -- unable to block knees to help with stability transfer; removed RW, and basic pivoted bed to chair with max assist and knees blocked  Ambulation/Gait             General Gait Details: Unable today   Stairs            Wheelchair Mobility    Modified Rankin (Stroke Patients Only)        Balance     Sitting balance-Leahy Scale: Poor Sitting balance - Comments: posterior and left lean     Standing balance-Leahy Scale: Zero Standing balance comment: Required +2 assist to remain standing for hygeine (3rd person assisting with hygeine); Able to tolerate standing longer today                    Cognition Arousal/Alertness: Awake/alert Behavior During Therapy: Anxious Overall Cognitive Status: History of cognitive impairments - at baseline Area of Impairment: Orientation;Attention;Memory;Safety/judgement;Awareness;Problem solving Orientation Level: Disoriented to;Place;Time;Situation   Memory: Decreased short-term memory   Safety/Judgement: Decreased awareness of safety   Problem Solving: Slow processing;Difficulty sequencing;Requires verbal cues General Comments: Advanced dementia per chart.    Exercises      General Comments        Pertinent Vitals/Pain Pain Assessment: No/denies pain    Home Living                      Prior Function            PT Goals (current goals can now be found in the care plan section) Acute Rehab PT Goals Patient Stated Goal: Unable to state, but interested in getting up PT Goal Formulation: Patient unable to participate in goal setting Time For Goal Achievement: 04/26/15 Potential to Achieve Goals: Fair Progress towards PT goals: Progressing toward goals    Frequency  Min 2X/week  PT Plan Current plan remains appropriate    Co-evaluation             End of Session Equipment Utilized During Treatment: Gait belt Activity Tolerance: Patient limited by fatigue Patient left: in chair;with call bell/phone within reach;with chair alarm set     Time: 1430-1500 PT Time Calculation (min) (ACUTE ONLY): 30 min  Charges:  $Therapeutic Activity: 23-37 mins                    G Codes:      [name] [name] 04/18/2015, 4:10 PM  [name] [name], [name] [name]  Acute Rehabilitation Services Pager  [phone] Office [phone]

## 2015-04-18 NOTE — Clinical Social Work Note (Signed)
CSW contacted [facility] and talked with Rinaldo Ratel, Memory Care Manager regarding patient returning to facility today. Clinicals transmitted to facility and a staff member came to hospital to evaluate patient. CSW later informed by Ms. Arlana Pouch that they cannot take patient back as she is not at her baseline and is worst now that when she left their facility to come to hospital.  Per Ms. Arlana Pouch, patient has been with them for several years. CSW followed up with daughter regarding bed offers for SNF placement for ST rehab and she is interested in [facility] in [place].  Patient is Level II PASRR and evaluator came to hospital today to see patient and get pertinent clinical information. As of writing this note, patient does not yet have a PASRR number for skilled nursing facility placement.  Genelle Bal, MSW, LCSW Licensed Clinical Social Worker Clinical Social Work Department [employer] [phone]

## 2015-04-19 LAB — BASIC METABOLIC PANEL
Anion gap: 8 (ref 5–15)
BUN: 16 mg/dL (ref 6–20)
CHLORIDE: 101 mmol/L (ref 101–111)
CO2: 25 mmol/L (ref 22–32)
CREATININE: 0.81 mg/dL (ref 0.44–1.00)
Calcium: 10.3 mg/dL (ref 8.9–10.3)
GFR calc Af Amer: >60 mL/min (ref >60)
GFR calc non Af Amer: >60 mL/min (ref >60)
Glucose, Bld: 111 mg/dL — ABNORMAL HIGH (ref 65–99)
Potassium: 4.2 mmol/L (ref 3.5–5.1)
Sodium: 134 mmol/L — ABNORMAL LOW (ref 135–145)

## 2015-04-19 LAB — CBC WITH DIFFERENTIAL/PLATELET
Basophils Absolute: 0 10*3/uL (ref 0.0–0.1)
Basophils Relative: 0 %
Eosinophils Absolute: 0.1 10*3/uL (ref 0.0–0.7)
Eosinophils Relative: 1 %
HEMATOCRIT: 38.6 % (ref 36.0–46.0)
HEMOGLOBIN: 12.6 g/dL (ref 12.0–15.0)
LYMPHS ABS: 1.4 10*3/uL (ref 0.7–4.0)
Lymphocytes Relative: 17 %
MCH: 30.1 pg (ref 26.0–34.0)
MCHC: 32.6 g/dL (ref 30.0–36.0)
MCV: 92.3 fL (ref 78.0–100.0)
MONOS PCT: 8 %
Monocytes Absolute: 0.7 10*3/uL (ref 0.1–1.0)
NEUTROS ABS: 6.3 10*3/uL (ref 1.7–7.7)
NEUTROS PCT: 74 %
Platelets: 430 10*3/uL — ABNORMAL HIGH (ref 150–400)
RBC: 4.18 MIL/uL (ref 3.87–5.11)
RDW: 12.7 % (ref 11.5–15.5)
WBC: 8.6 10*3/uL (ref 4.0–10.5)

## 2015-04-19 LAB — MAGNESIUM: Magnesium: 2.2 mg/dL (ref 1.7–2.4)

## 2015-04-19 NOTE — Progress Notes (Signed)
Stable, awaiting for placement.

## 2015-04-19 NOTE — Clinical Social Work Note (Signed)
CSW continuing with skilled facility search for patient. [facility] and [facility] contacted but has not responded back to CSW by phone or in CareFinder. Contact made with Phillis Haggis with [facility] and they have no bed availability.  CSW talked with patient's daughter (4:37 pm) and she requested that [facility] be sent patient information. Clinicals sent via CareFinder to [facility] and CSW will f/u with facility on 9/23.   Genelle Bal, MSW, LCSW Licensed Clinical Social Worker Clinical Social Work Department [employer] [phone]

## 2015-04-20 NOTE — Progress Notes (Signed)
Patient Discharge: Disposition: Patient discharge to [facility]. IV: N/A Telemetry: N/A Transportation: Patient transported via EMS. Belongings: Patient took all her belongings with her.

## 2015-04-20 NOTE — Clinical Social Work Placement (Signed)
   CLINICAL SOCIAL WORK PLACEMENT  NOTE  Date:  04/20/2015  Patient Details  Name: [name] [name] MRN: [id number] Date of Birth: [birth year]  Clinical Social Work is seeking post-discharge placement for this patient at the Skilled  Nursing Facility level of care (*CSW will initial, date and re-position this form in  chart as items are completed):  Yes   Patient/family provided with [place] Clinical Social Work Department's list of facilities offering this level of care within the geographic area requested by the patient (or if unable, by the patient's family).  Yes   Patient/family informed of their freedom to choose among providers that offer the needed level of care, that participate in Medicare, Medicaid or managed care program needed by the patient, have an available bed and are willing to accept the patient.  Yes   Patient/family informed of [place]'s ownership interest in [facility] and [facility], as well as of the fact that they are under no obligation to receive care at these facilities.  PASRR submitted to EDS on 04/17/15     PASRR number received on 04/19/15 (Number valid 9/22-11/21/16)     Existing PASRR number confirmed on       FL2 transmitted to all facilities in geographic area requested by pt/family on 04/17/15     FL2 transmitted to all facilities within larger geographic area on       Patient informed that his/her managed care company has contracts with or will negotiate with certain facilities, including the following:        Yes   Patient/family informed of bed offers received.  Patient chooses bed at [facility]     Physician recommends and patient chooses bed at      Patient to be transferred to [facility] on 04/20/15.  Patient to be transferred to facility by Ambulance [facility])     Patient family notified on 04/20/15 of transfer.  Name of family member notified:  Dorothey Baseman, daughter      PHYSICIAN       Additional Comment:     _______________________________________________ Cristobal Goldmann, LCSW 04/20/2015, 6:02 PM

## 2015-04-20 NOTE — Care Management Important Message (Signed)
Important Message  Patient Details  Name: [name] [name] MRN: [id number] Date of Birth: [birth year]   Medicare Important Message Given:  Yes-fourth notification given    Starlyn Skeans, RN 04/20/2015, 12:58 PM

## 2015-04-20 NOTE — Progress Notes (Signed)
Speech Language Pathology Treatment: Dysphagia  Patient Details Name: [name] [name] MRN: [id number] DOB: 07-02-1936 Today's Date: 04/20/2015 Time: 4098-1191 SLP Time Calculation (min) (ACUTE ONLY): 25 min  Assessment / Plan / Recommendation Clinical Impression  SLP assessed patient's toleration of regular texture solids to determine readiness for upgrade. Patient is confused and emotionally labile, but cooperative and follows cues. (redirection from topics about her family, etc helps her to stop with tearfulness, etc). She required verbal and tactile cues to initiate self-feeding of graham cracker, however once she brought cracker to her mouth, she managed independently. Patient exhibited slow mastication rate, but only minimal residuals in right side of oral cavity, which cleared with cued tongue sweep and liquid sip. SLP determined that patient is safe to upgrade solid textures from Dysphagia 2 to Dysphagia 3 (mechanical soft) as long as full supervision is maintained.    HPI Other Pertinent Information: [name] [name] is a 79 y.o. female with a PMH significant for depression, advanced dementia, HTN, and anxiety who presented to the ED on 04/10/15 after complaints of becoming progressively smore sleep and weak for four days prior to admission, per SNF staff. CT head indicated no acute intracranial abnormality, Pt's current complaints are acute kidney injury and weakness. Pt's diet is currently regular solid, thin liquid diet.    Pertinent Vitals Pain Assessment: 0-10 Pain Score: 10-Worst pain ever (Patient scored a 10 for pain, but was not in noticeable distress and did not appear to be in pain ) Pain Location: bilaterally in arms and legs Pain Descriptors / Indicators: Aching Pain Intervention(s): Relaxation  SLP Plan  Continue with current plan of care    Recommendations Diet recommendations: Dysphagia 3 (mechanical soft);Thin liquid Liquids provided via: Cup;Straw Medication  Administration: Whole meds with puree Supervision: Patient able to self feed;Full supervision/cueing for compensatory strategies Compensations: Small sips/bites;Check for pocketing;Slow rate;Minimize environmental distractions;Follow solids with liquid Postural Changes and/or Swallow Maneuvers: Seated upright 90 degrees              Oral Care Recommendations: Oral care BID Follow up Recommendations: Skilled Nursing facility Plan: Continue with current plan of care    GO     Pablo Lawrence 04/20/2015, 1:17 PM  Angela Nevin, MA, CCC-SLP 04/20/2015 1:21 PM

## 2015-07-21 ENCOUNTER — Inpatient Hospital Stay (HOSPITAL_COMMUNITY)
Admission: EM | Admit: 2015-07-21 | Discharge: 2015-07-25 | DRG: 871 | Disposition: A | Discharge status: Skilled Nursing Facility | Payer: Medicare Other | Attending: Internal Medicine | Admitting: Internal Medicine

## 2015-07-21 ENCOUNTER — Emergency Department (HOSPITAL_COMMUNITY): Payer: Medicare Other

## 2015-07-21 ENCOUNTER — Encounter (HOSPITAL_COMMUNITY): Payer: Self-pay | Admitting: Emergency Medicine

## 2015-07-21 DIAGNOSIS — J690 Pneumonitis due to inhalation of food and vomit: Secondary | ICD-10-CM | POA: Diagnosis present

## 2015-07-21 DIAGNOSIS — Z23 Encounter for immunization: Secondary | ICD-10-CM | POA: Diagnosis not present

## 2015-07-21 DIAGNOSIS — R410 Disorientation, unspecified: Secondary | ICD-10-CM | POA: Diagnosis present

## 2015-07-21 DIAGNOSIS — F319 Bipolar disorder, unspecified: Secondary | ICD-10-CM | POA: Diagnosis present

## 2015-07-21 DIAGNOSIS — Z79899 Other long term (current) drug therapy: Secondary | ICD-10-CM

## 2015-07-21 DIAGNOSIS — K219 Gastro-esophageal reflux disease without esophagitis: Secondary | ICD-10-CM | POA: Diagnosis not present

## 2015-07-21 DIAGNOSIS — F03918 Unspecified dementia, unspecified severity, with other behavioral disturbance: Secondary | ICD-10-CM | POA: Diagnosis present

## 2015-07-21 DIAGNOSIS — N179 Acute kidney failure, unspecified: Secondary | ICD-10-CM | POA: Diagnosis present

## 2015-07-21 DIAGNOSIS — M1990 Unspecified osteoarthritis, unspecified site: Secondary | ICD-10-CM | POA: Diagnosis present

## 2015-07-21 DIAGNOSIS — Z993 Dependence on wheelchair: Secondary | ICD-10-CM | POA: Diagnosis not present

## 2015-07-21 DIAGNOSIS — Z87440 Personal history of urinary (tract) infections: Secondary | ICD-10-CM

## 2015-07-21 DIAGNOSIS — Y95 Nosocomial condition: Secondary | ICD-10-CM | POA: Diagnosis present

## 2015-07-21 DIAGNOSIS — Z87891 Personal history of nicotine dependence: Secondary | ICD-10-CM | POA: Diagnosis not present

## 2015-07-21 DIAGNOSIS — R509 Fever, unspecified: Secondary | ICD-10-CM | POA: Diagnosis not present

## 2015-07-21 DIAGNOSIS — E860 Dehydration: Secondary | ICD-10-CM | POA: Diagnosis present

## 2015-07-21 DIAGNOSIS — Z66 Do not resuscitate: Secondary | ICD-10-CM | POA: Diagnosis present

## 2015-07-21 DIAGNOSIS — A419 Sepsis, unspecified organism: Secondary | ICD-10-CM | POA: Diagnosis present

## 2015-07-21 DIAGNOSIS — F0391 Unspecified dementia with behavioral disturbance: Secondary | ICD-10-CM | POA: Diagnosis not present

## 2015-07-21 DIAGNOSIS — J189 Pneumonia, unspecified organism: Secondary | ICD-10-CM | POA: Diagnosis present

## 2015-07-21 DIAGNOSIS — R1310 Dysphagia, unspecified: Secondary | ICD-10-CM | POA: Diagnosis present

## 2015-07-21 HISTORY — DX: Chronic kidney disease, unspecified: N18.9

## 2015-07-21 LAB — COMPREHENSIVE METABOLIC PANEL
ALBUMIN: 3.6 g/dL (ref 3.5–5.0)
ALK PHOS: 52 U/L (ref 38–126)
ALT: 12 U/L — ABNORMAL LOW (ref 14–54)
ANION GAP: 13 (ref 5–15)
AST: 25 U/L (ref 15–41)
BILIRUBIN TOTAL: 0.5 mg/dL (ref 0.3–1.2)
BUN: 23 mg/dL — ABNORMAL HIGH (ref 6–20)
CALCIUM: 10.4 mg/dL — AB (ref 8.9–10.3)
CO2: 26 mmol/L (ref 22–32)
Chloride: 105 mmol/L (ref 101–111)
Creatinine, Ser: 1.14 mg/dL — ABNORMAL HIGH (ref 0.44–1.00)
GFR, EST AFRICAN AMERICAN: 52 mL/min — AB (ref >60)
GFR, EST NON AFRICAN AMERICAN: 45 mL/min — AB (ref >60)
GLUCOSE: 116 mg/dL — AB (ref 65–99)
Potassium: 3.8 mmol/L (ref 3.5–5.1)
Sodium: 144 mmol/L (ref 135–145)
TOTAL PROTEIN: 7.1 g/dL (ref 6.5–8.1)

## 2015-07-21 LAB — URINALYSIS, ROUTINE W REFLEX MICROSCOPIC
BILIRUBIN URINE: NEGATIVE
Glucose, UA: NEGATIVE mg/dL
KETONES UR: NEGATIVE mg/dL
Leukocytes, UA: NEGATIVE
NITRITE: NEGATIVE
Protein, ur: NEGATIVE mg/dL
SPECIFIC GRAVITY, URINE: 1.018 (ref 1.005–1.030)
pH: 6 (ref 5.0–8.0)

## 2015-07-21 LAB — CBC
HCT: 37.2 % (ref 36.0–46.0)
HEMOGLOBIN: 12.3 g/dL (ref 12.0–15.0)
MCH: 29.6 pg (ref 26.0–34.0)
MCHC: 33.1 g/dL (ref 30.0–36.0)
MCV: 89.6 fL (ref 78.0–100.0)
Platelets: 186 10*3/uL (ref 150–400)
RBC: 4.15 MIL/uL (ref 3.87–5.11)
RDW: 13.7 % (ref 11.5–15.5)
WBC: 17.7 10*3/uL — AB (ref 4.0–10.5)

## 2015-07-21 LAB — URINE MICROSCOPIC-ADD ON: WBC, UA: NONE SEEN WBC/hpf (ref 0–5)

## 2015-07-21 LAB — DIFFERENTIAL
Basophils Absolute: 0 10*3/uL (ref 0.0–0.1)
Basophils Relative: 0 %
EOS ABS: 0 10*3/uL (ref 0.0–0.7)
EOS PCT: 0 %
LYMPHS PCT: 7 %
Lymphs Abs: 1.2 10*3/uL (ref 0.7–4.0)
Monocytes Absolute: 0.9 10*3/uL (ref 0.1–1.0)
Monocytes Relative: 5 %
NEUTROS PCT: 88 %
Neutro Abs: 15.6 10*3/uL — ABNORMAL HIGH (ref 1.7–7.7)

## 2015-07-21 LAB — I-STAT CG4 LACTIC ACID, ED: LACTIC ACID, VENOUS: 1.71 mmol/L (ref 0.5–2.0)

## 2015-07-21 MED ORDER — SODIUM CHLORIDE 0.9 % IV SOLN
INTRAVENOUS | Status: DC
  Administered 2015-07-21 – 2015-07-22 (×3): via INTRAVENOUS

## 2015-07-21 MED ORDER — SODIUM CHLORIDE 0.9 % IJ SOLN
3.0000 mL | Freq: Two times a day (BID) | INTRAMUSCULAR | Status: DC
  Administered 2015-07-21 – 2015-07-25 (×6): 3 mL via INTRAVENOUS

## 2015-07-21 MED ORDER — VANCOMYCIN HCL IN DEXTROSE 1-5 GM/200ML-% IV SOLN: 1000.0000 mg | INTRAVENOUS | Status: DC

## 2015-07-21 MED ORDER — SODIUM CHLORIDE 0.9 % IV BOLUS (SEPSIS)
1000.0000 mL | Freq: Once | INTRAVENOUS | Status: AC
  Administered 2015-07-21: 1000 mL via INTRAVENOUS

## 2015-07-21 MED ORDER — ENOXAPARIN SODIUM 40 MG/0.4ML ~~LOC~~ SOLN
40.0000 mg | Freq: Every day | SUBCUTANEOUS | Status: DC
  Administered 2015-07-21 – 2015-07-24 (×4): 40 mg via SUBCUTANEOUS
  Filled 2015-07-21 (×4): qty 0.4

## 2015-07-21 MED ORDER — VANCOMYCIN HCL IN DEXTROSE 1-5 GM/200ML-% IV SOLN
1000.0000 mg | Freq: Once | INTRAVENOUS | Status: AC
  Administered 2015-07-21: 1000 mg via INTRAVENOUS
  Filled 2015-07-21: qty 200

## 2015-07-21 MED ORDER — DEXTROSE 5 % IV SOLN
2.0000 g | INTRAVENOUS | Status: DC
  Administered 2015-07-22: 2 g via INTRAVENOUS
  Filled 2015-07-21: qty 2

## 2015-07-21 MED ORDER — PANTOPRAZOLE SODIUM 40 MG IV SOLR
40.0000 mg | Freq: Every day | INTRAVENOUS | Status: DC
  Administered 2015-07-21 – 2015-07-23 (×3): 40 mg via INTRAVENOUS
  Filled 2015-07-21 (×3): qty 40

## 2015-07-21 MED ORDER — MORPHINE SULFATE (PF) 2 MG/ML IV SOLN: 2.0000 mg | INTRAVENOUS | Status: DC | PRN

## 2015-07-21 MED ORDER — HALOPERIDOL LACTATE 5 MG/ML IJ SOLN
5.0000 mg | Freq: Four times a day (QID) | INTRAMUSCULAR | Status: DC | PRN
  Administered 2015-07-21: 5 mg via INTRAVENOUS
  Filled 2015-07-21: qty 1

## 2015-07-21 MED ORDER — ACETAMINOPHEN 325 MG PO TABS
650.0000 mg | ORAL_TABLET | Freq: Once | ORAL | Status: AC
  Administered 2015-07-21: 650 mg via ORAL
  Filled 2015-07-21: qty 2

## 2015-07-21 MED ORDER — CEFEPIME HCL 1 G IJ SOLR
1.0000 g | Freq: Once | INTRAMUSCULAR | Status: AC
  Administered 2015-07-21: 1 g via INTRAVENOUS
  Filled 2015-07-21: qty 1

## 2015-07-21 MED ORDER — ACETAMINOPHEN 650 MG RE SUPP: 650.0000 mg | Freq: Four times a day (QID) | RECTAL | Status: DC | PRN

## 2015-07-21 NOTE — ED Notes (Signed)
MD at bedside. 

## 2015-07-21 NOTE — ED Notes (Signed)
Per EMS. [facility]. Hx dementia. Family called EMS due to decreased mental status. Pt was able to leave facility to go out to lunch a few days ago, but now will not get up. Pt recently started on abx for a cough. EMS encoded sepsis. NPA placed by EMS for snoring. Pt complained of HA and side pain to EMS. Just says "I hurt" during triage.

## 2015-07-21 NOTE — ED Notes (Signed)
Dr. Maryfrances Bunnellanford, the hospitalist is still in the room talking to pt and family member.  Will transfer after they are done talking.

## 2015-07-21 NOTE — Progress Notes (Signed)
ANTIBIOTIC CONSULT NOTE - INITIAL  Pharmacy Consult for Vancomycin Indication: pneumonia  Allergies  Allergen Reactions  . Abilify [Aripiprazole] Other (See Comments)    Caused pt to not be able to walk and was unable to care for herself     Patient Measurements: Height: 5\' 6"  (167.6 cm) Weight: 135 lb (61.236 kg) IBW/kg (Calculated) : 59.3  Vital Signs: Temp: 99.8 F (37.7 C) (12/24 1604) Temp Source: Oral (12/24 1604) BP: 110/44 mmHg (12/24 1604) Pulse Rate: 90 (12/24 1604) Intake/Output from previous day:    Labs:  Recent Labs  07/21/15 1738  WBC 17.7*  HGB 12.3  PLT 186  CREATININE 1.14*   Estimated Creatinine Clearance: 38.7 mL/min (by C-G formula based on Cr of 1.14).  Microbiology: No results found for this or any previous visit (from the past 720 hour(s)).  Medical History: Past Medical History  Diagnosis Date  . Dementia   . Anxiety   . MDD (major depressive disorder) (HCC)   . Hypertension   . Head ache   . Tremor   . Osteoarthritis   . Chronic kidney disease (CKD)     Medications:  Anti-infectives    Start     Dose/Rate Route Frequency Ordered Stop   07/21/15 1715  vancomycin (VANCOCIN) IVPB 1000 mg/200 mL premix     1,000 mg 200 mL/hr over 60 Minutes Intravenous  Once 07/21/15 1700     07/21/15 1700  ceFEPIme (MAXIPIME) 1 g in dextrose 5 % 50 mL IVPB     1 g 100 mL/hr over 30 Minutes Intravenous  Once 07/21/15 1649       Assessment: 79 yoF admitted 12/24 from nursing home for decreased mental status, cough, and pain.  CXR shows acute bronchitis or reactive airway disease and possible developing pneumonia.  Pharmacy has been consulted to dose vancomycin.  Today, 07/21/2015: Tmax 99.8 WBC 17.7 SCr 1.14 (elevated) with CrCl ~ 38 ml/min Lactic acid: 1.71  Antimicrobials this admission: 12/24 >> Vanc >> 12/24 >> Cefepime x1 dose per MD  Levels/dose changes this admission: None  Microbiology Results: 12/24 BCx: sent 12/24 UCx:  sent  Goal of Therapy:  Vancomycin trough level 15-20 mcg/ml  Appropriate abx dosing, eradication of infection.   Plan:   Vancomycin 1g IV once, then 1g IV q24h.  Measure Vanc trough at steady state.  Follow up renal fxn, culture results, and clinical course.   Lynann Beaverhristine Devun Anna PharmD, BCPS Pager [phone] 07/21/2015 5:01 PM

## 2015-07-21 NOTE — ED Notes (Signed)
Bed: WA08 Expected date:  Expected time:  Means of arrival:  Comments: EMS- Sepsis

## 2015-07-21 NOTE — ED Notes (Signed)
UNABLE TO COLLECT LABS THE PATIENT IS IN XRAY AT THIS TIME.

## 2015-07-21 NOTE — H&P (Signed)
History and Physical  Patient Name: [name] [name]     ZOX:[id number]    DOB: Dec 03, 1935    DOA: 07/21/2015 Referring physician: Pricilla Loveless, MD PCP: [name], MD      Chief Complaint: Fever, cough and altered mental status  HPI: [name] [name] is a 79 y.o. female with a past medical history significant for advanced dementia with behavioral disturbance/Bipolar d/o who presents with confusion and fever from NH.  All history is collected from the daughter who is present at the bedside. Patient is unable to provide history due to dementia and altered mental status.  Last night, nursing home called daughter to report the patient required supplemental oxygen and was coughing and would be started on azithromycin. Today, the daughter arrived at the nursing home finding the patient confused, intermittently lethargic, complaining of pain "all over", and low blood pressure per nursing. She had low-grade fever and cough and so was transported to the ER.  In the ED, the patient had a low-grade fever, tachycardia, leukocytosis and low-normal SPO2 and mild AKI. A chest x-ray showed a new left lower lobe airspace opacity. CT of the head was normal. Urinalysis showed casts but no significant pyuria or hematuria. Lactic acid level is normal. Patient has of the hip and knee because of patient pain were normal.  The current illness comes to the context of about 4 years of progressive dementia. In 2013, she had recurrent falls resulting in an extensive neurological work up and psychiatric testing in [place] (records not available through [facility]), and was diagnosed with dementia NOS in addition to her chronic Bipolar.  In the last six months, the patient has had increasing agitation, declining physical function (she was walking with a walker as recently as this summer), worse memory and behavior, and now recurrent UTI, culminating in admission to [facility] for AKI and ESBL UTI in September.  Since  her admission in September, the patient has been wheelchair bound. Cognitively she had returned to baseline (dementia but conversational with borderline and bipolar per daughter), with intermittent symptoms of delirium. Most recently about 2 weeks ago, the daughter found the patient at the nursing home confused, and told staff who evaluated the patient and found that she had another ESBL UTI which was treated.  She does not have a     Review of Systems:  Pt complains of pain all over, pain in hip, neck, leg. Patient otherwise unable to provide ROS, other than as noted above (fever, hypotension, lethargy, hypoxia, cough, confusion, weakness).    Allergies  Allergen Reactions  . Abilify [Aripiprazole] Other (See Comments)    Caused pt to not be able to walk and was unable to care for herself     Prior to Admission medications   Medication Sig Start Date End Date Taking? Authorizing Provider  acetaminophen (TYLENOL) 500 MG tablet Take 500 mg by mouth every 6 (six) hours as needed for moderate pain.   Yes Historical Provider, MD  alendronate (FOSAMAX) 70 MG tablet Take 1 tablet (70 mg total) by mouth once a week. Take with a full glass of water on an empty stomach. Takes on Mondays 03/18/15  Yes Beau Fanny, FNP  benztropine (COGENTIN) 0.5 MG tablet Take 1 tablet (0.5 mg total) by mouth 2 (two) times daily. 04/17/15  Yes Starleen Arms, MD  busPIRone (BUSPAR) 15 MG tablet Take 15 mg by mouth 2 (two) times daily.   Yes Historical Provider, MD  clonazePAM (KLONOPIN) 1 MG tablet Take  1 tablet (1 mg total) by mouth 3 (three) times daily. 04/17/15  Yes Leana Roe Elgergawy, MD  cycloSPORINE (RESTASIS) 0.05 % ophthalmic emulsion Place 1 drop into both eyes 2 (two) times daily. 03/18/15  Yes Beau Fanny, FNP  divalproex (DEPAKOTE SPRINKLE) 125 MG capsule Take 250 mg by mouth 2 (two) times daily.  at 9am and  at 9pm   Yes Historical Provider, MD  docusate sodium (COLACE) 100 MG capsule  Take 100 mg by mouth 2 (two) times daily.   Yes Historical Provider, MD  Melatonin 3 MG TABS Take 1 tablet by mouth at bedtime.   Yes Historical Provider, MD  Multiple Vitamins-Minerals (CERTA-VITE PO) Take 1 tablet by mouth daily.   Yes Historical Provider, MD  omeprazole (PRILOSEC) 20 MG capsule Take 1 capsule (20 mg total) by mouth daily after breakfast. 03/18/15  Yes Beau Fanny, FNP  risperiDONE (RISPERDAL) 1 MG tablet Take 1 tablet (1 mg total) by mouth 2 (two) times daily. 04/17/15  Yes Leana Roe Elgergawy, MD  rivastigmine (EXELON) 1.5 MG capsule Take 1.5 mg by mouth 2 (two) times daily.   Yes Historical Provider, MD    Past Medical History  Diagnosis Date  . Dementia   . Anxiety   . MDD (major depressive disorder) (HCC)   . Hypertension   . Head ache   . Tremor   . Osteoarthritis   . Chronic kidney disease (CKD)     Past Surgical History  Procedure Laterality Date  . Abdominal hysterectomy    . Hemorroidectomy    . Pterygium excision    . Foot surgery      right    Family history: family history includes Dementia in her mother.  Mother with what the daughter believes was Alzheimers.  Social History: Patient lives in [place].  Had been in ALF Memory Care unit previously, d/c'd to [facility], failed rehab, and is now in LTC.  Not currently smoking.  From [place], and was in banking for many years.  Daughter [name] Quin is Product manager, and lives locally.        Physical Exam: BP 108/35 mmHg  Pulse 92  Temp(Src) 99 F (37.2 C) (Rectal)  Resp 18  Ht  (1.676 m)  Wt 61.236 kg (135 lb)  BMI 21.80 kg/m2  SpO2 92% General appearance: Debilitated adult female, lethargic and intermittently calling out "don't touch my dog".     Eyes: Conjunctiva injected, no discharge.     ENT: No nasal deformity, discharge, or epistaxis.  OP tacky without lesions.   Skin: Warm and dry.  Flushing fading in face.  No other rashes or suspicious lesions on face, neck, chest, upper back abdomen  legs or arms.  Capillary refill is brisk Cardiac: Tachycardic, regular, nl S1-S2, no murmurs appreciated.  No LE edema.  Radial pulses 2+ and symmetric. Respiratory: No increased WOB.  Rales in left base. Abdomen: Abdomen with voluntary guarding.  No ascites, distension.   MSK: No deformities or effusions.  Patient guarding hips, but mobile bilaterally wtihout deformity. Neuro: Redirectable, able to state she is in [place], her name, and that it is near Christmas.  Speech is fluent. In pain.  Muscle tone normal in bilateral upper and lower extremities.  No asymmetric movement.  Cranial nerves grossly intact.    Psych: Behavior agitated.  Occasionally cries, "get out of this jail cell" or "you touch my dog and I'll get you!".       Labs on  Admission:  The metabolic panel shows normal sodium, potassium, bicarbonate. Elevated BUN to creatinine ratio, and serum creatinine 1.14 mg/dL from a baseline around 0.7-0.8 mg/dL. Lactic acid level normal Urinalysis shows casts, rare bacteria, not significant RBCs or WBCs compared to previous. The complete blood count shows leukocytosis with left shift.   Radiological Exams on Admission: Personally reviewed: Dg Chest 2 View 07/21/2015  LLL opacity.  Radiology report notes RLL developing opacity, possibly.    Ct Head Wo Contrast 07/21/2015  NAICP    Dg Knee Complete 4 Views Right and Dg Hip Unilat With Pelvis 2-3 Views Right 07/21/2015 Negative    EKG: Independently reviewed. Sinus rhythm, QTc normal.  No ST changes.    Assessment/Plan 1. Sepsis from pneumonia:  This is new.  Suspected source pneumonia, possibly aspiration related. Organism unknown. Patient meets criteria given tachycardia, leukocytosis, and evidence of organ dysfunction (AKI and AMS).  Blood and urine cultures drawn.  Lactate normal and will not be repeated.  MAP > 65 mmHg. -Vancomycin and cefepime   -Stop outpatient azithromycin -30 ml/kg bolus begun in  ED -Telemetry -Acetaminophen PR for fever or low dose IV opioid for pain -NPO and Swallow eval -Sputum gram stain and culture -HIV and strep/legionella urine antigens (there was actually a question by daughter whether patient had had new sexual partner last spring, not currently) -Follow cultures -PT   2. AKI:  This is new.  Suspect pre-renal AKI. -Fluids and trend BMP  3. ?Red man syndrome:  Patient with facial redness after vanc in ER.  No respiratory difficulty. -Infuse vancomycin at no greater than 100 ml/hr, discussed with pharmacy -Avoid diphenhydramine for now, given altered mental status  4. Bipolar disorder/dementia with behavioral disturbance:  -Hold risperidone, Depakote, and clonazepam given mental status -ECG to evaluate QTc (was moderately prolonged previously), tele and Haldol 5 mg IV PRN for agitation  5. Difficulty swallowing:  -Swallow evaluation -Hold all oral meds  6. GERD: -Convert PPI to IV       DVT PPx: Lovenox Diet: NPO for now Consultants: Speech Code Status: Full Family Communication: Daughter, [name] [name], present at bedside.  Patient's current diagnosis, overnight treatment plan were discussed.  Overall decline was discussed.  Patient does not have established geriatrician or PCP, has no guidance for issues of patient's prognosis given her decline in function/cognition this year, and guidance regarding code status, but expresses concern that patient's quality of life is deteriorating.   Medical decision making: What exists of the patient's previous chart was reviewed in depth and the case was discussed with Dr. Criss AlvineGoldston. Patient seen 9:24 PM on 07/21/2015.  Disposition Plan:  Admit to tele inpatient for HCAP and sepsis.  Monitor renal function and mental status.  Follow blood and urine cultures.  Anticipate 4-5 days hospitalization.      Alberteen SamChristopher P Joeanne Robicheaux [facility] Pager [phone]

## 2015-07-21 NOTE — ED Notes (Signed)
Pt's family member reports that pt has developed a red rash on the top of her forehead.  Dr. Criss AlvineGoldston made aware.

## 2015-07-21 NOTE — Progress Notes (Signed)
ANTIBIOTIC CONSULT NOTE - INITIAL  Pharmacy Consult for Cefepime (renal antibiotic adjustment) Indication: pneumonia  Allergies  Allergen Reactions  . Abilify [Aripiprazole] Other (See Comments)    Caused pt to not be able to walk and was unable to care for herself     Patient Measurements: Height: 5\' 6"  (167.6 cm) Weight: 138 lb 7.2 oz (62.8 kg) IBW/kg (Calculated) : 59.3 Adjusted Body Weight:   Vital Signs: Temp: 98.5 F (36.9 C) (12/24 2210) Temp Source: Axillary (12/24 2210) BP: 110/36 mmHg (12/24 2221) Pulse Rate: 94 (12/24 2221) Intake/Output from previous day:   Intake/Output from this shift:    Labs:  Recent Labs  07/21/15 1738  WBC 17.7*  HGB 12.3  PLT 186  CREATININE 1.14*   Estimated Creatinine Clearance: 38.7 mL/min (by C-G formula based on Cr of 1.14). No results for input(s): VANCOTROUGH, VANCOPEAK, VANCORANDOM, GENTTROUGH, GENTPEAK, GENTRANDOM, TOBRATROUGH, TOBRAPEAK, TOBRARND, AMIKACINPEAK, AMIKACINTROU, AMIKACIN in the last 72 hours.   Microbiology: Recent Results (from the past 720 hour(s))  Blood culture (routine x 2)     Status: None (Preliminary result)   Collection Time: 07/21/15  5:43 PM  Result Value Ref Range Status   Specimen Description BLOOD RIGHT ANTECUBITAL  Final   Special Requests   Final    IN PEDIATRIC BOTTLE 5CC [facility]    Culture PENDING  Incomplete   Report Status PENDING  Incomplete    Medical History: Past Medical History  Diagnosis Date  . Dementia   . Anxiety   . MDD (major depressive disorder) (HCC)   . Hypertension   . Head ache   . Tremor   . Osteoarthritis   . Chronic kidney disease (CKD)     Medications:  Anti-infectives    Start     Dose/Rate Route Frequency Ordered Stop   07/22/15 1800  vancomycin (VANCOCIN) IVPB 1000 mg/200 mL premix  Status:  Discontinued     1,000 mg 200 mL/hr over 60 Minutes Intravenous Every 24 hours 07/21/15 1931 07/21/15 2129   07/22/15 1800   vancomycin (VANCOCIN) IVPB 1000 mg/200 mL premix     1,000 mg 200 mL/hr over 120 Minutes Intravenous Every 24 hours 07/21/15 2129     07/22/15 0600  ceFEPIme (MAXIPIME) 2 g in dextrose 5 % 50 mL IVPB     2 g 100 mL/hr over 30 Minutes Intravenous Every 24 hours 07/21/15 2201 07/30/15 0559   07/21/15 1715  vancomycin (VANCOCIN) IVPB 1000 mg/200 mL premix     1,000 mg 200 mL/hr over 60 Minutes Intravenous  Once 07/21/15 1700 07/21/15 1945   07/21/15 1700  ceFEPIme (MAXIPIME) 1 g in dextrose 5 % 50 mL IVPB     1 g 100 mL/hr over 30 Minutes Intravenous  Once 07/21/15 1649 07/21/15 1928     Assessment: Patient being followed by pharmacy for vancomycin dosing.  Patient's current renal function is poor at ~3738mL/min.  First dose of antibiotics already given.  Goal of Therapy:  Cefepime dosed based on patient weight and renal function   Plan:  Follow up culture results  Change cefepime to 2gm iv q24hr  Aleene DavidsonGrimsley Jr, Alexis Reber Crowford 07/21/2015,10:34 PM

## 2015-07-21 NOTE — ED Notes (Signed)
Pt in xray

## 2015-07-21 NOTE — ED Provider Notes (Signed)
CSN: [id number]     Arrival date & time 07/21/15  1549 History   First MD Initiated Contact with Patient 07/21/15 1610     Chief Complaint  Patient presents with  . Altered Mental Status     (Consider location/radiation/quality/duration/timing/severity/associated sxs/prior Treatment) HPI  79 year old female presents from the nursing home with decreased mental status. Patient is now not getting up out of bed which is abnormal for her. Recently started on antibiotics for a cough. Unknown if she is having fevers. She is complaining of a headache and side pain EMS but to me she denies headache and states she is hurting in her right hip. She is unable to localize the pain for me. She has a history of dementia and thus the rest the history is difficult.  Past Medical History  Diagnosis Date  . Dementia   . Anxiety   . MDD (major depressive disorder) (HCC)   . Hypertension   . Head ache   . Tremor   . Osteoarthritis   . Chronic kidney disease (CKD)    Past Surgical History  Procedure Laterality Date  . Abdominal hysterectomy    . Hemorroidectomy    . Pterygium excision    . Foot surgery      right   History reviewed. No pertinent family history. Social History  Substance Use Topics  . Smoking status: Current Every Day Smoker    Start date: 01/24/2015  . Smokeless tobacco: None  . Alcohol Use: No   OB History    No data available     Review of Systems  Unable to perform ROS: Dementia      Allergies  Abilify  Home Medications   Prior to Admission medications   Medication Sig Start Date End Date Taking? Authorizing Provider  acetaminophen (TYLENOL) 500 MG tablet Take 500 mg by mouth every 6 (six) hours as needed for moderate pain.   Yes Historical Provider, MD  alendronate (FOSAMAX) 70 MG tablet Take 1 tablet (70 mg total) by mouth once a week. Take with a full glass of water on an empty stomach. Takes on Mondays 03/18/15  Yes Beau Fanny, FNP  azithromycin  (ZITHROMAX) 250 MG tablet Take 250 mg by mouth daily. Take  by mouth every day for 4 days   Yes Historical Provider, MD  azithromycin (ZITHROMAX) 500 MG tablet Take 500 mg by mouth once.   Yes Historical Provider, MD  benztropine (COGENTIN) 0.5 MG tablet Take 1 tablet (0.5 mg total) by mouth 2 (two) times daily. 04/17/15  Yes Starleen Arms, MD  busPIRone (BUSPAR) 15 MG tablet Take 15 mg by mouth 2 (two) times daily.   Yes Historical Provider, MD  clonazePAM (KLONOPIN) 1 MG tablet Take 1 tablet (1 mg total) by mouth 3 (three) times daily. 04/17/15  Yes Leana Roe Elgergawy, MD  cycloSPORINE (RESTASIS) 0.05 % ophthalmic emulsion Place 1 drop into both eyes 2 (two) times daily. 03/18/15  Yes Beau Fanny, FNP  divalproex (DEPAKOTE SPRINKLE) 125 MG capsule Take 250 mg by mouth 2 (two) times daily.  at 9am and  at 9pm   Yes Historical Provider, MD  docusate sodium (COLACE) 100 MG capsule Take 100 mg by mouth 2 (two) times daily.   Yes Historical Provider, MD  Melatonin 3 MG TABS Take 1 tablet by mouth at bedtime.   Yes Historical Provider, MD  Multiple Vitamins-Minerals (CERTA-VITE PO) Take 1 tablet by mouth daily.   Yes Historical Provider, MD  omeprazole (  PRILOSEC) 20 MG capsule Take 1 capsule (20 mg total) by mouth daily after breakfast. 03/18/15  Yes Beau FannyJohn C Withrow, FNP  risperiDONE (RISPERDAL) 1 MG tablet Take 1 tablet (1 mg total) by mouth 2 (two) times daily. 04/17/15  Yes Leana Roeawood S Elgergawy, MD  rivastigmine (EXELON) 1.5 MG capsule Take 1.5 mg by mouth 2 (two) times daily.   Yes Historical Provider, MD  DULoxetine (CYMBALTA) 60 MG capsule Take 2 capsules (120 mg total) by mouth daily. 04/17/15   Leana Roeawood S Elgergawy, MD   BP 110/44 mmHg  Pulse 90  Temp(Src) 99.8 F (37.7 C) (Oral)  Resp 20  Ht 5\' 6"  (1.676 m)  Wt 135 lb (61.236 kg)  BMI 21.80 kg/m2  SpO2 93% Physical Exam  Constitutional: She appears well-developed and well-nourished.  HENT:  Head: Normocephalic and  atraumatic.  Right Ear: External ear normal.  Left Ear: External ear normal.  Nose: Nose normal.  Eyes: Right eye exhibits no discharge. Left eye exhibits no discharge.  Cardiovascular: Normal rate, regular rhythm and normal heart sounds.   Pulmonary/Chest: Effort normal. She has rales in the left lower field.  Abdominal: Soft. There is no tenderness.  Musculoskeletal:  Patient is holding RLE internally rotated but no pain with ROM of knee or hip. No foot pain. No joint swelling or cellulitis  Neurological: She is alert. She is disoriented.  Skin: Skin is warm and dry.  Nursing note and vitals reviewed.   ED Course  Procedures (including critical care time) Labs Review Labs Reviewed  COMPREHENSIVE METABOLIC PANEL - Abnormal; Notable for the following:    Glucose, Bld 116 (*)    BUN 23 (*)    Creatinine, Ser 1.14 (*)    Calcium 10.4 (*)    ALT 12 (*)    GFR calc non Af Amer 45 (*)    GFR calc Af Amer 52 (*)    All other components within normal limits  CBC - Abnormal; Notable for the following:    WBC 17.7 (*)    All other components within normal limits  URINALYSIS, ROUTINE W REFLEX MICROSCOPIC (NOT AT [facility]) - Abnormal; Notable for the following:    Color, Urine AMBER (*)    APPearance CLOUDY (*)    Hgb urine dipstick SMALL (*)    All other components within normal limits  DIFFERENTIAL - Abnormal; Notable for the following:    Neutro Abs 15.6 (*)    All other components within normal limits  URINE MICROSCOPIC-ADD ON - Abnormal; Notable for the following:    Squamous Epithelial / LPF 0-5 (*)    Bacteria, UA RARE (*)    Casts HYALINE CASTS (*)    All other components within normal limits  CULTURE, BLOOD (ROUTINE X 2)  URINE CULTURE  CULTURE, BLOOD (ROUTINE X 2)  CULTURE, EXPECTORATED SPUTUM-ASSESSMENT  GRAM STAIN  HIV ANTIBODY (ROUTINE TESTING)  STREP PNEUMONIAE URINARY ANTIGEN  LEGIONELLA ANTIGEN, URINE  BASIC METABOLIC PANEL  CBC  I-STAT CG4 LACTIC ACID, ED     Imaging Review Dg Chest 2 View  07/21/2015  CLINICAL DATA:  Cough. EXAM: CHEST  2 VIEW COMPARISON:  04/04/2015 FINDINGS: Cardiomediastinal silhouette is normal. Mediastinal contours appear intact. There is no evidence of focal airspace consolidation, pleural effusion or pneumothorax. There is peribronchovascular thickening with lower lobe predominance which may be seen with bronchitis or reactive airway disease. More focal subtle airspace consolidation is seen in the right lower lobe. Osseous structures are without acute abnormality. Soft tissues are  grossly normal. IMPRESSION: Findings compatible with acute bronchitis or reactive airway disease. Subtle focal airspace consolidation in the right lower lobe may represent superimposed developing pneumonia. Electronically Signed   By: Ted Mcalpine M.D.   On: 07/21/2015 16:28   I have personally reviewed and evaluated these images and lab results as part of my medical decision-making.   EKG Interpretation None      MDM   Final diagnoses:  None  HCAP  Patient symptoms are concerning for pneumonia. On clinical exam and my review of x-rays she appears to have a left lower lobe pneumonia. She is altered more than normal. She is protecting her airway is awake and alert but confused. Given her altered mental status, bump in creatinine, and elevated WBC she qualifies as septic. We'll continue with IV fluids and broad antibiotics and admit to the hospitalist.    Pricilla Loveless, MD 07/22/15 [phone]

## 2015-07-22 DIAGNOSIS — N179 Acute kidney failure, unspecified: Secondary | ICD-10-CM

## 2015-07-22 DIAGNOSIS — J189 Pneumonia, unspecified organism: Secondary | ICD-10-CM

## 2015-07-22 DIAGNOSIS — F0391 Unspecified dementia with behavioral disturbance: Secondary | ICD-10-CM

## 2015-07-22 LAB — CBC
HCT: 32.4 % — ABNORMAL LOW (ref 36.0–46.0)
HEMOGLOBIN: 10.7 g/dL — AB (ref 12.0–15.0)
MCH: 29.2 pg (ref 26.0–34.0)
MCHC: 33 g/dL (ref 30.0–36.0)
MCV: 88.3 fL (ref 78.0–100.0)
PLATELETS: 153 10*3/uL (ref 150–400)
RBC: 3.67 MIL/uL — ABNORMAL LOW (ref 3.87–5.11)
RDW: 13.6 % (ref 11.5–15.5)
WBC: 14.2 10*3/uL — ABNORMAL HIGH (ref 4.0–10.5)

## 2015-07-22 LAB — BASIC METABOLIC PANEL
Anion gap: 9 (ref 5–15)
BUN: 15 mg/dL (ref 6–20)
CALCIUM: 9.5 mg/dL (ref 8.9–10.3)
CO2: 24 mmol/L (ref 22–32)
CREATININE: 0.69 mg/dL (ref 0.44–1.00)
Chloride: 112 mmol/L — ABNORMAL HIGH (ref 101–111)
GLUCOSE: 90 mg/dL (ref 65–99)
POTASSIUM: 3.4 mmol/L — AB (ref 3.5–5.1)
SODIUM: 145 mmol/L (ref 135–145)

## 2015-07-22 LAB — HIV ANTIBODY (ROUTINE TESTING W REFLEX): HIV Screen 4th Generation wRfx: NONREACTIVE

## 2015-07-22 MED ORDER — DIPHENHYDRAMINE HCL 50 MG/ML IJ SOLN: 12.5000 mg | Freq: Two times a day (BID) | INTRAMUSCULAR | Status: DC | PRN

## 2015-07-22 MED ORDER — CLONAZEPAM 0.5 MG PO TABS
0.5000 mg | ORAL_TABLET | Freq: Two times a day (BID) | ORAL | Status: DC | PRN
  Administered 2015-07-22 – 2015-07-25 (×4): 0.5 mg via ORAL
  Filled 2015-07-22 (×4): qty 1

## 2015-07-22 MED ORDER — HALOPERIDOL LACTATE 5 MG/ML IJ SOLN
2.0000 mg | Freq: Four times a day (QID) | INTRAMUSCULAR | Status: DC | PRN
  Administered 2015-07-22 (×2): 2 mg via INTRAVENOUS
  Filled 2015-07-22 (×2): qty 1

## 2015-07-22 MED ORDER — INFLUENZA VAC SPLIT QUAD 0.5 ML IM SUSY
0.5000 mL | PREFILLED_SYRINGE | INTRAMUSCULAR | Status: AC
  Administered 2015-07-24: 0.5 mL via INTRAMUSCULAR
  Filled 2015-07-22 (×2): qty 0.5

## 2015-07-22 MED ORDER — PNEUMOCOCCAL VAC POLYVALENT 25 MCG/0.5ML IJ INJ
0.5000 mL | INJECTION | INTRAMUSCULAR | Status: AC
  Administered 2015-07-24: 0.5 mL via INTRAMUSCULAR
  Filled 2015-07-22 (×2): qty 0.5

## 2015-07-22 MED ORDER — SODIUM CHLORIDE 0.9 % IV SOLN
500.0000 mg | Freq: Two times a day (BID) | INTRAVENOUS | Status: DC
  Administered 2015-07-22 – 2015-07-24 (×5): 500 mg via INTRAVENOUS
  Filled 2015-07-22 (×6): qty 500

## 2015-07-22 MED ORDER — DIVALPROEX SODIUM 125 MG PO CSDR
250.0000 mg | DELAYED_RELEASE_CAPSULE | Freq: Two times a day (BID) | ORAL | Status: DC
  Administered 2015-07-22 – 2015-07-25 (×7): 250 mg via ORAL
  Filled 2015-07-22 (×8): qty 2

## 2015-07-22 MED ORDER — DEXTROSE 5 % IV SOLN
2.0000 g | Freq: Two times a day (BID) | INTRAVENOUS | Status: DC
  Administered 2015-07-22 – 2015-07-23 (×3): 2 g via INTRAVENOUS
  Filled 2015-07-22 (×4): qty 2

## 2015-07-22 NOTE — Progress Notes (Signed)
ANTIBIOTIC CONSULT NOTE - follow up  Pharmacy Consult for Vancomycin, cefepime (renal adjustment) Indication: pneumonia  Allergies  Allergen Reactions  . Abilify [Aripiprazole] Other (See Comments)    Caused pt to not be able to walk and was unable to care for herself     Patient Measurements: Height:  (167.6 cm) Weight: 138 lb 7.2 oz (62.8 kg) IBW/kg (Calculated) : 59.3  Vital Signs: Temp: 97.4 F (36.3 C) (12/25 0451) Temp Source: Axillary (12/25 0451) BP: 112/53 mmHg (12/25 0451) Pulse Rate: 87 (12/25 0451) Intake/Output from previous day: 12/24 0701 - 12/25 0700 In: 1097.9 [I.V.:1047.9; IV Piggyback:50] Out: 300 [Urine:300]  Labs:  Recent Labs  07/21/15 1738 07/22/15 0515  WBC 17.7* 14.2*  HGB 12.3 10.7*  PLT 186 153  CREATININE 1.14* 0.69   Estimated Creatinine Clearance: 55.1 mL/min (by C-G formula based on Cr of 0.69).  Microbiology: Recent Results (from the past 720 hour(s))  Blood culture (routine x 2)     Status: None (Preliminary result)   Collection Time: 07/21/15  5:43 PM  Result Value Ref Range Status   Specimen Description BLOOD RIGHT ANTECUBITAL  Final   Special Requests   Final    IN PEDIATRIC BOTTLE 5CC Performed at [facility]    Culture PENDING  Incomplete   Report Status PENDING  Incomplete    Medical History: Past Medical History  Diagnosis Date  . Dementia   . Anxiety   . MDD (major depressive disorder) (HCC)   . Hypertension   . Head ache   . Tremor   . Osteoarthritis   . Chronic kidney disease (CKD)     Medications:  Anti-infectives    Start     Dose/Rate Route Frequency Ordered Stop   07/22/15 1800  vancomycin (VANCOCIN) IVPB 1000 mg/200 mL premix  Status:  Discontinued     1,000 mg 200 mL/hr over 60 Minutes Intravenous Every 24 hours 07/21/15 1931 07/21/15 2129   07/22/15 1800  vancomycin (VANCOCIN) IVPB 1000 mg/200 mL premix     1,000 mg 200 mL/hr over 120 Minutes Intravenous Every 24 hours 07/21/15  2129     07/22/15 0600  ceFEPIme (MAXIPIME) 2 g in dextrose 5 % 50 mL IVPB     2 g 100 mL/hr over 30 Minutes Intravenous Every 24 hours 07/21/15 2201 07/30/15 0559   07/21/15 1715  vancomycin (VANCOCIN) IVPB 1000 mg/200 mL premix     1,000 mg 200 mL/hr over 60 Minutes Intravenous  Once 07/21/15 1700 07/21/15 1945   07/21/15 1700  ceFEPIme (MAXIPIME) 1 g in dextrose 5 % 50 mL IVPB     1 g 100 mL/hr over 30 Minutes Intravenous  Once 07/21/15 1649 07/21/15 1928     Assessment: [name] [name] admitted 12/24 from nursing home for decreased mental status, cough, and pain.  CXR shows acute bronchitis or reactive airway disease and possible developing pneumonia.  Pharmacy has been consulted to dose vancomycin.  Today, 07/22/2015: Afeb, WBC improved SCr improved and CrCl now est 55 ml/min Lactic acid: 1.71  Antimicrobials this admission: 12/24 >> Vanc >> 12/24 >> Cefepime >>  Levels/dose changes this admission: None  Microbiology Results: 12/24 BCx: sent 12/24 UCx: sent  Goal of Therapy:  Vancomycin trough level 15-20 mcg/ml  Appropriate abx dosing, eradication of infection.   Plan:  1) Will change vancomycin from 1g IV q24 to  IV q12 due to improved renal function 2) Due to possible infusion reaction, however, will again slow down vanc  infusion and schedule Benadryl 30 min prior to vanc starting 3) Change cefepime from 2g IV q24 to 2g IV q24 due to improved renal function   Hessie KnowsJustin M Tomeka Kantner, PharmD, BCPS Pager [phone] 07/22/2015 7:53 AM

## 2015-07-22 NOTE — Progress Notes (Signed)
Writer is taking over care of patient, agree with previous RN assessment, patient resting comfortably at this time. Will continue to monitor.  

## 2015-07-22 NOTE — Progress Notes (Signed)
[facility] PROGRESS NOTE  [name] [name] HQI:[id number] DOB: 02/24/1936 DOA: 07/21/2015 PCP: [name], MD  Assessment/Plan: 1. Sepsis/HCAP vs Aspiration pneumonia:  -improving, continue Vancomycin and cefepime  -continue IVF, cut down dose -SLP eval today -Follow cultures -lactic acid normal  2. AKI:  -pre-renal AKI, reoslved  3. ?Red man syndrome:  -apparently had facial redness after vanc in ER. No respiratory difficulty. -Infuse vancomycin at no greater than 100 ml/hr, admitting MD discussed with pharmacy  4. Dementia with behavioral disturbance:  -mentation much improved -Hold risperidone, resume Depakote, and clonazepam at lower dose to prevent withdrawal - QTc <480  5. Bipolar d/o -psychotropic meds as above  6. ? Dysphagia  -SLP eval  7. GERD: -Continue PPI  DVT proph: lovenox  Code Status: Full Code Family Communication: none at bedside Disposition Plan: SNf when stable     HPI/Subjective: Feels better, hungry  Objective: Filed Vitals:   07/21/15 2235 07/22/15 0451  BP: 124/56 112/53  Pulse:  87  Temp:  97.4 F (36.3 C)  Resp:  18    Intake/Output Summary (Last 24 hours) at 07/22/15 0900 Last data filed at 07/22/15 0700  Gross per 24 hour  Intake 1097.92 ml  Output    300 ml  Net 797.92 ml   Filed Weights   07/21/15 1610 07/21/15 2210  Weight: 61.236 kg (135 lb) 62.8 kg (138 lb 7.2 oz)    Exam:   General:  AAOx to self, place, pleasant  Cardiovascular: S1S2/RRR  Respiratory: diminished BS at bases  Abdomen: soft, Nt, BS present  Musculoskeletal: no edema c/c   Data Reviewed: Basic Metabolic Panel:  Recent Labs Lab 07/21/15 1738 07/22/15 0515  NA 144 145  K 3.8 3.4*  CL 105 112*  CO2 26 24  GLUCOSE 116* 90  BUN 23* 15  CREATININE 1.14* 0.69  CALCIUM 10.4* 9.5   Liver Function Tests:  Recent Labs Lab 07/21/15 1738  AST 25  ALT 12*  ALKPHOS 52  BILITOT 0.5  PROT 7.1  ALBUMIN 3.6   No  results for input(s): LIPASE, AMYLASE in the last 168 hours. No results for input(s): AMMONIA in the last 168 hours. CBC:  Recent Labs Lab 07/21/15 1738 07/22/15 0515  WBC 17.7* 14.2*  NEUTROABS 15.6*  --   HGB 12.3 10.7*  HCT 37.2 32.4*  MCV 89.6 88.3  PLT 186 153   Cardiac Enzymes: No results for input(s): CKTOTAL, CKMB, CKMBINDEX, TROPONINI in the last 168 hours. BNP (last 3 results) No results for input(s): BNP in the last 8760 hours.  ProBNP (last 3 results) No results for input(s): PROBNP in the last 8760 hours.  CBG: No results for input(s): GLUCAP in the last 168 hours.  Recent Results (from the past 240 hour(s))  Blood culture (routine x 2)     Status: None (Preliminary result)   Collection Time: 07/21/15  5:43 PM  Result Value Ref Range Status   Specimen Description BLOOD RIGHT ANTECUBITAL  Final   Special Requests   Final    IN PEDIATRIC BOTTLE 5CC Performed at [facility]    Culture PENDING  Incomplete   Report Status PENDING  Incomplete     Studies: Dg Chest 2 View  07/21/2015  CLINICAL DATA:  Cough. EXAM: CHEST  2 VIEW COMPARISON:  04/04/2015 FINDINGS: Cardiomediastinal silhouette is normal. Mediastinal contours appear intact. There is no evidence of focal airspace consolidation, pleural effusion or pneumothorax. There is peribronchovascular thickening with lower lobe predominance which may be seen  with bronchitis or reactive airway disease. More focal subtle airspace consolidation is seen in the right lower lobe. Osseous structures are without acute abnormality. Soft tissues are grossly normal. IMPRESSION: Findings compatible with acute bronchitis or reactive airway disease. Subtle focal airspace consolidation in the right lower lobe may represent superimposed developing pneumonia. Electronically Signed   By: Ted Mcalpine M.D.   On: 07/21/2015 16:28   Ct Head Wo Contrast  07/21/2015  CLINICAL DATA:  Decreased mental status. Recently started  on antibiotics for cough. History of dementia. EXAM: CT HEAD WITHOUT CONTRAST TECHNIQUE: Contiguous axial images were obtained from the base of the skull through the vertex without intravenous contrast. COMPARISON:  Head CT 04/11/2015. FINDINGS: There is no evidence of acute intracranial hemorrhage, mass lesion, brain edema or extra-axial fluid collection. The ventricles and subarachnoid spaces are prominent but stable. There is no CT evidence of acute cortical infarction. There is mild chronic periventricular white matter disease, likely due to chronic small vessel ischemic changes. Right nasal cannula in place. The visualized paranasal sinuses, mastoid air cells and middle ears are clear. The calvarium is intact. IMPRESSION: No acute intracranial findings. Stable atrophy and chronic small vessel ischemic changes. Electronically Signed   By: Carey Bullocks M.D.   On: 07/21/2015 17:30   Dg Knee Complete 4 Views Right  07/21/2015  CLINICAL DATA:  pt with dementia, tech was not able to obtain hx from pt; RN's note : Per EMS. Pt from [facility] nursing home. Hx dementia. Family called EMS due to decreased mental status. Pt was able to leave facility to go out to lunch a .*comment was truncated* EXAM: RIGHT KNEE - COMPLETE 4+ VIEW COMPARISON:  None. FINDINGS: Mild diffuse osteopenia. There is no evidence of fracture, dislocation, or joint effusion. There is no evidence of arthropathy or other focal bone abnormality. Soft tissues are unremarkable. IMPRESSION: Negative. Electronically Signed   By: Corlis Leak M.D.   On: 07/21/2015 17:18   Dg Hip Unilat With Pelvis 2-3 Views Right  07/21/2015  CLINICAL DATA:  pt with dementia, tech was not able to obtain hx from pt; RN's note : Per EMS. Pt from [facility] nursing home. Hx dementia. Family called EMS due to decreased mental status. Pt was able to leave facility to go out to lunch a .*comment was truncated* EXAM: DG HIP (WITH OR WITHOUT PELVIS) 2-3V RIGHT  COMPARISON:  CT 04/11/2015 FINDINGS: There is no evidence of hip fracture or dislocation. There is no evidence of arthropathy or other focal bone abnormality. Degenerative disc disease L5-S1 incidentally noted. IMPRESSION: 1. Negative right hip. 2. L5-S1 degenerative disc disease. Electronically Signed   By: Corlis Leak M.D.   On: 07/21/2015 17:17    Scheduled Meds: . ceFEPime (MAXIPIME) IV  2 g Intravenous Q12H  . divalproex  250 mg Oral BID  . enoxaparin (LOVENOX) injection  40 mg Subcutaneous QHS  . pantoprazole (PROTONIX) IV  40 mg Intravenous QHS  . sodium chloride  3 mL Intravenous Q12H  . vancomycin  500 mg Intravenous Q12H   Continuous Infusions: . sodium chloride 75 mL/hr (07/22/15 0820)   Antibiotics Given (last 72 hours)    Date/Time Action Medication Dose Rate   07/22/15 0505 Given   ceFEPIme (MAXIPIME) 2 g in dextrose 5 % 50 mL IVPB 2 g 100 mL/hr      Principal Problem:   HCAP (healthcare-associated pneumonia) Active Problems:   Dehydration   Dementia with behavioral disturbance   Polypharmacy  Delirium   Acute kidney injury (HCC)   Sepsis [facility](HCC)    Time spent:1535min    [facility] Memorial HospitalJOSEPH,Aymen Widrig  [facility] Pager [phone]. If 7PM-7AM, please contact night-coverage at [email or web address], password [email or web address] 07/22/2015, 9:00 AM  LOS: 1 day

## 2015-07-23 LAB — CBC
HCT: 30.8 % — ABNORMAL LOW (ref 36.0–46.0)
Hemoglobin: 9.9 g/dL — ABNORMAL LOW (ref 12.0–15.0)
MCH: 28.1 pg (ref 26.0–34.0)
MCHC: 32.1 g/dL (ref 30.0–36.0)
MCV: 87.5 fL (ref 78.0–100.0)
PLATELETS: 165 10*3/uL (ref 150–400)
RBC: 3.52 MIL/uL — ABNORMAL LOW (ref 3.87–5.11)
RDW: 13.6 % (ref 11.5–15.5)
WBC: 10.5 10*3/uL (ref 4.0–10.5)

## 2015-07-23 LAB — BASIC METABOLIC PANEL
ANION GAP: 9 (ref 5–15)
BUN: 9 mg/dL (ref 6–20)
CALCIUM: 9.4 mg/dL (ref 8.9–10.3)
CO2: 23 mmol/L (ref 22–32)
CREATININE: 0.56 mg/dL (ref 0.44–1.00)
Chloride: 110 mmol/L (ref 101–111)
Glucose, Bld: 104 mg/dL — ABNORMAL HIGH (ref 65–99)
Potassium: 3.1 mmol/L — ABNORMAL LOW (ref 3.5–5.1)
SODIUM: 142 mmol/L (ref 135–145)

## 2015-07-23 LAB — URINE CULTURE: Culture: NO GROWTH

## 2015-07-23 NOTE — Progress Notes (Signed)
PT Cancellation Note  Patient Details Name: [name] GaskinsCharlotte [name] MRN: [id number] DOB: 1936-03-28   Cancelled Treatment:    Reason Eval/Treat Not Completed: Fatigue/lethargy limiting ability to participate (pt did not arouse to verbal nor tactile stimuli, she is sleeping soundly. [facility] pt[facility] who stated [facility] and could self propel her WC.  Will follow. )   Tamala SerUhlenberg, Helyne Genther Kistler 07/23/2015, 8:45 AM [phone]

## 2015-07-23 NOTE — Progress Notes (Signed)
[facility] PROGRESS NOTE  [name] GaskinsCharlotte [name] ZOX:[id number] DOB: 1936-04-09 DOA: 07/21/2015 PCP: [name] ParkerBOWEN,SAMUEL, MD  Assessment/Plan: 1. Sepsis/HCAP vs Aspiration pneumonia:  -improving, continue Vancomycin and cefepime  -continue IVF ns at 75cc/hr -SLP eval when more awake -Follow cultures-NGTD -lactic acid normal  2. AKI:  -pre-renal AKI, resolved  3. ?Red man syndrome:  -apparently had facial redness after vanc in ER. No respiratory difficulty. -Infuse vancomycin at no greater than 100 ml/hr, admitting MD discussed with pharmacy  4. Dementia with behavioral disturbance:  -was agitated last pm, got Iv haldol and Klonopin early am, still very somnolent from this, will stop haldol -Held risperidone, resumed Depakote, and clonazepam at lower dose to prevent withdrawal - QTc <480  5. Bipolar d/o -psychotropic meds as above  6. ? Dysphagia  -SLP eval when appropriate  7. GERD: -Continue PPI  DVT proph: lovenox  Code Status: Full Code Family Communication: none at bedside Disposition Plan: SNf when stable     HPI/Subjective: Agitated last pm, got IV haldol and Klonopin earlier this am, sleeping since  Objective: Filed Vitals:   07/22/15 2212 07/23/15 0504  BP: 120/50 131/67  Pulse: 85 91  Temp: 98.1 F (36.7 C) 98.4 F (36.9 C)  Resp: 20 20    Intake/Output Summary (Last 24 hours) at 07/23/15 1054 Last data filed at 07/23/15 0600  Gross per 24 hour  Intake 2041.67 ml  Output      0 ml  Net 2041.67 ml   Filed Weights   07/21/15 1610 07/21/15 2210  Weight: 61.236 kg (135 lb) 62.8 kg (138 lb 7.2 oz)    Exam:   General:  Somnolent, arouses to verbal cues  Cardiovascular: S1S2/RRR  Respiratory: diminished BS at bases  Abdomen: soft, Nt, BS present  Musculoskeletal: no edema c/c   Data Reviewed: Basic Metabolic Panel:  Recent Labs Lab 07/21/15 1738 07/22/15 0515 07/23/15 0525  NA 144 145 142  K 3.8 3.4* 3.1*  CL 105 112* 110   CO2 26 24 23   GLUCOSE 116* 90 104*  BUN 23* 15 9  CREATININE 1.14* 0.69 0.56  CALCIUM 10.4* 9.5 9.4   Liver Function Tests:  Recent Labs Lab 07/21/15 1738  AST 25  ALT 12*  ALKPHOS 52  BILITOT 0.5  PROT 7.1  ALBUMIN 3.6   No results for input(s): LIPASE, AMYLASE in the last 168 hours. No results for input(s): AMMONIA in the last 168 hours. CBC:  Recent Labs Lab 07/21/15 1738 07/22/15 0515 07/23/15 0525  WBC 17.7* 14.2* 10.5  NEUTROABS 15.6*  --   --   HGB 12.3 10.7* 9.9*  HCT 37.2 32.4* 30.8*  MCV 89.6 88.3 87.5  PLT 186 153 165   Cardiac Enzymes: No results for input(s): CKTOTAL, CKMB, CKMBINDEX, TROPONINI in the last 168 hours. BNP (last 3 results) No results for input(s): BNP in the last 8760 hours.  ProBNP (last 3 results) No results for input(s): PROBNP in the last 8760 hours.  CBG: No results for input(s): GLUCAP in the last 168 hours.  Recent Results (from the past 240 hour(s))  Blood culture (routine x 2)     Status: None (Preliminary result)   Collection Time: 07/21/15  5:38 PM  Result Value Ref Range Status   Specimen Description LEFT ANTECUBITAL  Final   Special Requests BOTTLES DRAWN AEROBIC AND ANAEROBIC 5CC  Final   Culture   Final    NO GROWTH 2 DAYS Performed at [facility]    Report Status PENDING  Incomplete  Blood culture (routine x 2)     Status: None (Preliminary result)   Collection Time: 07/21/15  5:43 PM  Result Value Ref Range Status   Specimen Description BLOOD RIGHT ANTECUBITAL  Final   Special Requests IN PEDIATRIC BOTTLE 5CC  Final   Culture   Final    NO GROWTH 2 DAYS Performed at [facility]    Report Status PENDING  Incomplete     Studies: Dg Chest 2 View  07/21/2015  CLINICAL DATA:  Cough. EXAM: CHEST  2 VIEW COMPARISON:  04/04/2015 FINDINGS: Cardiomediastinal silhouette is normal. Mediastinal contours appear intact. There is no evidence of focal airspace consolidation, pleural effusion or  pneumothorax. There is peribronchovascular thickening with lower lobe predominance which may be seen with bronchitis or reactive airway disease. More focal subtle airspace consolidation is seen in the right lower lobe. Osseous structures are without acute abnormality. Soft tissues are grossly normal. IMPRESSION: Findings compatible with acute bronchitis or reactive airway disease. Subtle focal airspace consolidation in the right lower lobe may represent superimposed developing pneumonia. Electronically Signed   By: Ted Mcalpine M.D.   On: 07/21/2015 16:28   Ct Head Wo Contrast  07/21/2015  CLINICAL DATA:  Decreased mental status. Recently started on antibiotics for cough. History of dementia. EXAM: CT HEAD WITHOUT CONTRAST TECHNIQUE: Contiguous axial images were obtained from the base of the skull through the vertex without intravenous contrast. COMPARISON:  Head CT 04/11/2015. FINDINGS: There is no evidence of acute intracranial hemorrhage, mass lesion, brain edema or extra-axial fluid collection. The ventricles and subarachnoid spaces are prominent but stable. There is no CT evidence of acute cortical infarction. There is mild chronic periventricular white matter disease, likely due to chronic small vessel ischemic changes. Right nasal cannula in place. The visualized paranasal sinuses, mastoid air cells and middle ears are clear. The calvarium is intact. IMPRESSION: No acute intracranial findings. Stable atrophy and chronic small vessel ischemic changes. Electronically Signed   By: Carey Bullocks M.D.   On: 07/21/2015 17:30   Dg Knee Complete 4 Views Right  07/21/2015  CLINICAL DATA:  pt with dementia, tech was not able to obtain hx from pt; RN's note : Per EMS. Pt from [facility] nursing home. Hx dementia. Family called EMS due to decreased mental status. Pt was able to leave facility to go out to lunch a .*comment was truncated* EXAM: RIGHT KNEE - COMPLETE 4+ VIEW COMPARISON:  None. FINDINGS:  Mild diffuse osteopenia. There is no evidence of fracture, dislocation, or joint effusion. There is no evidence of arthropathy or other focal bone abnormality. Soft tissues are unremarkable. IMPRESSION: Negative. Electronically Signed   By: Corlis Leak M.D.   On: 07/21/2015 17:18   Dg Hip Unilat With Pelvis 2-3 Views Right  07/21/2015  CLINICAL DATA:  pt with dementia, tech was not able to obtain hx from pt; RN's note : Per EMS. Pt from [facility] nursing home. Hx dementia. Family called EMS due to decreased mental status. Pt was able to leave facility to go out to lunch a .*comment was truncated* EXAM: DG HIP (WITH OR WITHOUT PELVIS) 2-3V RIGHT COMPARISON:  CT 04/11/2015 FINDINGS: There is no evidence of hip fracture or dislocation. There is no evidence of arthropathy or other focal bone abnormality. Degenerative disc disease L5-S1 incidentally noted. IMPRESSION: 1. Negative right hip. 2. L5-S1 degenerative disc disease. Electronically Signed   By: Corlis Leak M.D.   On: 07/21/2015 17:17    Scheduled  Meds: . ceFEPime (MAXIPIME) IV  2 g Intravenous Q12H  . divalproex  250 mg Oral BID  . enoxaparin (LOVENOX) injection  40 mg Subcutaneous QHS  . Influenza vac split quadrivalent PF  0.5 mL Intramuscular Tomorrow-1000  . pantoprazole (PROTONIX) IV  40 mg Intravenous QHS  . pneumococcal 23 valent vaccine  0.5 mL Intramuscular Tomorrow-1000  . sodium chloride  3 mL Intravenous Q12H  . vancomycin  500 mg Intravenous Q12H   Continuous Infusions: . sodium chloride 75 mL/hr at 07/22/15 1951   Antibiotics Given (last 72 hours)    Date/Time Action Medication Dose Rate   07/22/15 0505 Given   ceFEPIme (MAXIPIME) 2 g in dextrose 5 % 50 mL IVPB 2 g 100 mL/hr   07/22/15 1008 Given   vancomycin (VANCOCIN) 500 mg in sodium chloride 0.9 % 100 mL IVPB 500 mg 50 mL/hr   07/22/15 2210 Given   vancomycin (VANCOCIN) 500 mg in sodium chloride 0.9 % 100 mL IVPB 500 mg 50 mL/hr   07/22/15 2210 Given   ceFEPIme  (MAXIPIME) 2 g in dextrose 5 % 50 mL IVPB 2 g 100 mL/hr   07/23/15 0950 Given   ceFEPIme (MAXIPIME) 2 g in dextrose 5 % 50 mL IVPB 2 g 100 mL/hr      Principal Problem:   HCAP (healthcare-associated pneumonia) Active Problems:   Dehydration   Dementia with behavioral disturbance   Polypharmacy   Delirium   Acute kidney injury (HCC)   Sepsis (HCC)    Time spent:42min    Tylin Stradley  [facility] Pager [phone]. If 7PM-7AM, please contact night-coverage at [email or web address], password [facility] 07/23/2015, 10:54 AM  LOS: 2 days

## 2015-07-23 NOTE — Clinical Social Work Note (Signed)
Clinical Social Work Assessment  Patient Details  Name: [name] [name] MRN: [id number] Date of Birth: 04-Mar-1936  Date of referral:  07/23/15               Reason for consult:  Discharge Planning                Permission sought to share information with:  Family Supports Permission granted to share information::     Name::     Trinidad Curet  Agency::     Relationship::  daughter  Contact Information:  [phone]  Housing/Transportation Living arrangements for the past 2 months:  Skilled Nursing Facility Source of Information:  Adult Children Patient Interpreter Needed:  None Criminal Activity/Legal Involvement Pertinent to Current Situation/Hospitalization:  No - Comment as needed Significant Relationships:  Adult Children Lives with:  Facility Resident Do you feel safe going back to the place where you live?  Yes (pt daughter agreeable to return to [facility] if needed, but wants to see if facility closer to [facility]  will be able to accept pt) Need for family participation in patient care:  Yes (Comment)  Care giving concerns:  Pt admitted from [facility]. Pt daughter reports that facility is 30 minutes from her home and pt daughter would like to see facility closer to [facility] would accept pt.    Social Worker assessment / plan:  CSW received referral that pt admitted from [place].   CSW visited pt room. Pt disoriented x 4. Pt daughter not present at this time. CSW contacted pt daughter via telephone. CSW introduced self and explained role. Pt daughter confirmed that pt admitted from [name]. Pt daughter reports that pt was initially at [facility], but when pt admitted to the hospital in September the ALF did not feel they could continue to meet pt needs and pt went to [facility] for rehab because at the time it was difficult to place pt due to behaviors per pt daughter. Pt daughter reports that pt was discharged from the rehab portion at [facility]  after two weeks and has been in the skilled part until admission. Pt daughter reports that she lives in [place] and would like to explore if [facility] or [facility] memory care units would be able to accept pt from hospital. Pt daughter agreeable to return to [place] if these facilities cannot accept pt upon discharge from the hospital. CSW discussed process and discussed that CSW will explore two options that pt daughter requested while pt in the hospital.  CSW completed FL2 and sent information to [facility] and [facility] memory care units. CSW also sent information to [facility] as pt may have to return to facility.   CSW to follow up with pt daughter regarding options.   CSW to continue to follow provide support and assist with pt disposition needs.   Employment status:  Retired Health and safety inspector:  Medicare PT Recommendations:  Skilled Nursing Facility Information / Referral to community resources:  Skilled Nursing Facility  Patient/Family's Response to care:  Pt disoriented x 4 and unable to participate in assessment. Pt daughter actively involved and hopeful that pt may be able to get accepted to a facility in [facility] closer to her home.  Patient/Family's Understanding of and Emotional Response to Diagnosis, Current Treatment, and Prognosis:  Pt daughter able to discuss pt diagnosis accurately with CSW and treatment plan.   Emotional Assessment Appearance:  Appears  stated age Attitude/Demeanor/Rapport:  Unable to Assess Affect (typically observed):  Unable to Assess Orientation:   (disoriented x 4) Alcohol / Substance use:  Not Applicable Psych involvement (Current and /or in the community):  No (Comment)  Discharge Needs  Concerns to be addressed:  Discharge Planning Concerns Readmission within the last 30 days:    Current discharge risk:  Chronically ill Barriers to Discharge:  Continued Medical Work up   [name], Khalib Fendley A,  LCSW 07/23/2015, 4:49 PM [phone]

## 2015-07-23 NOTE — Clinical Social Work Placement (Signed)
   CLINICAL SOCIAL WORK PLACEMENT  NOTE  Date:  07/23/2015  Patient Details  Name: [name] [name] MRN: [id number] Date of Birth: 07-22-1936  Clinical Social Work is seeking post-discharge placement for this patient at the Skilled  Nursing Facility level of care (*CSW will initial, date and re-position this form in  chart as items are completed):  No (pt daughter requesting search to [facility] and [facility])   Patient/family provided with [facility] Clinical Social Work Department's list of facilities offering this level of care within the geographic area requested by the patient (or if unable, by the patient's family).  Yes   Patient/family informed of their freedom to choose among providers that offer the needed level of care, that participate in Medicare, Medicaid or managed care program needed by the patient, have an available bed and are willing to accept the patient.  No   Patient/family informed of [facility]'s ownership interest in [facility] and [facility], as well as of the fact that they are under no obligation to receive care at these facilities.  PASRR submitted to EDS on       PASRR number received on       Existing PASRR number confirmed on 07/23/15     FL2 transmitted to all facilities in geographic area requested by pt/family on 07/23/15     FL2 transmitted to all facilities within larger geographic area on       Patient informed that his/her managed care company has contracts with or will negotiate with certain facilities, including the following:            Patient/family informed of bed offers received.  Patient chooses bed at       Physician recommends and patient chooses bed at      Patient to be transferred to   on  .  Patient to be transferred to facility by       Patient family notified on   of transfer.  Name of family member notified:        PHYSICIAN Please sign FL2     Additional Comment:     _______________________________________________ [name], [name], LCSW 07/23/2015, 4:57 PM

## 2015-07-23 NOTE — NC FL2 (Signed)
[place] MEDICAID FL2 LEVEL OF CARE SCREENING TOOL     IDENTIFICATION  Patient Name: [name] [name] Birthdate: 07/03/1936 Sex: female Admission Date (Current Location): 07/21/2015  [facility] [place]   Facility and Address:         Provider Number: [id number]  Attending Physician Name and Address:  Zannie CovePreetha Joseph, MD  Relative Name and Phone Number:       Current Level of Care: Hospital Recommended Level of Care: Skilled Nursing Facility Prior Approval Number:    Date Approved/Denied:   PASRR Number: [id number]  Discharge Plan: SNF    Current Diagnoses: Patient Active Problem List   Diagnosis Date Noted  . HCAP (healthcare-associated pneumonia) 07/21/2015  . Sepsis (HCC) 07/21/2015  . Acute kidney injury (HCC) 04/11/2015  . Renal insufficiency 04/11/2015  . Delirium 03/17/2015  . Polypharmacy 01/27/2015  . Dementia with behavioral disturbance 01/26/2015  . Hypokalemia 01/26/2015  . Pressure ulcer 01/25/2015  . Dehydration 01/24/2015  . UTI (lower urinary tract infection) 01/24/2015  . Metabolic encephalopathy 01/24/2015  . Hyponatremia 01/24/2015    Orientation RESPIRATION BLADDER Height & Weight     (Disoriented X 4)  Normal Incontinent 5\' 6"  (167.6 cm) 138 lbs.  BEHAVIORAL SYMPTOMS/MOOD NEUROLOGICAL BOWEL NUTRITION STATUS  Other (Comment) (Pt has a diagnosis of Dementia with behavioral disturbance, pt does not have sitter or restraints in hospital)  (NONE) Incontinent Diet (Low sodium, heart healthy)  AMBULATORY STATUS COMMUNICATION OF NEEDS Skin   Extensive Assist (Per PT, Spoke with pt's daughter and staff [facility] who stated she was a [facility] and could self propel her WC.) Verbally Normal                       Personal Care Assistance Level of Assistance  Bathing, Dressing, Feeding Bathing Assistance: Limited assistance Feeding assistance: Limited assistance Dressing Assistance: Limited  assistance     Functional Limitations Info  Sight, Hearing, Speech Sight Info: Adequate Hearing Info: Impaired Speech Info: Adequate    SPECIAL CARE FACTORS FREQUENCY  PT (By licensed PT), OT (By licensed OT)     PT Frequency: 5 X weekly OT Frequency: 5 X weekly             Contractures Contractures Info: Not present    Additional Factors Info  Code Status, Allergies Code Status Info: FULL code status Allergies Info: Abilify     Isolation Precautions Info: Contact precautions: ESBL (extended spectrum beta lactamase)     Current Medications (07/23/2015):  This is the current hospital active medication list Current Facility-Administered Medications  Medication Dose Route Frequency Provider Last Rate Last Dose  . 0.9 %  sodium chloride infusion   Intravenous Continuous Zannie CovePreetha Joseph, MD 75 mL/hr at 07/22/15 1951    . acetaminophen (TYLENOL) suppository 650 mg  650 mg Rectal Q6H PRN Alberteen Samhristopher P Danford, MD      . ceFEPIme (MAXIPIME) 2 g in dextrose 5 % 50 mL IVPB  2 g Intravenous Q12H Hessie KnowsJustin M Legge, RPH   2 g at 07/23/15 0950  . clonazePAM (KLONOPIN) tablet 0.5 mg  0.5 mg Oral BID PRN Zannie CovePreetha Joseph, MD   0.5 mg at 07/23/15 0511  . divalproex (DEPAKOTE SPRINKLE) capsule 250 mg  250 mg Oral BID Zannie CovePreetha Joseph, MD   250 mg at 07/23/15 1100  . enoxaparin (LOVENOX) injection 40 mg  40 mg Subcutaneous QHS Alberteen Samhristopher P Danford, MD   40 mg at 07/22/15  2213  . Influenza vac split quadrivalent PF (FLUARIX) injection 0.5 mL  0.5 mL Intramuscular Tomorrow-1000 Zannie Cove, MD      . pantoprazole (PROTONIX) injection 40 mg  40 mg Intravenous QHS Alberteen Sam, MD   40 mg at 07/22/15 2214  . pneumococcal 23 valent vaccine (PNU-IMMUNE) injection 0.5 mL  0.5 mL Intramuscular Tomorrow-1000 Zannie Cove, MD      . sodium chloride 0.9 % injection 3 mL  3 mL Intravenous Q12H Alberteen Sam, MD   3 mL at 07/22/15 2215  . vancomycin (VANCOCIN) 500 mg in sodium chloride 0.9  % 100 mL IVPB  500 mg Intravenous Q12H Hessie Knows, RPH   500 mg at 07/23/15 1101     Discharge Medications: Please see discharge summary for a list of discharge medications.  Relevant Imaging Results:  Relevant Lab Results:   Additional Information SSN: [id number]  KIDD, SUZANNA A, LCSW

## 2015-07-23 NOTE — Care Management Note (Signed)
Case Management Note  Patient Details  Name: Joya GaskinsCharlotte Wedemeyer MRN: [id number] Date of Birth: [birth year]  Subjective/Objective:     Pt admitted with HCAP               Action/Plan:   [facility]  Expected Discharge Date:                  Expected Discharge Plan:  Skilled Nursing Facility  In-House Referral:  Clinical Social Work  Discharge planning Services  CM Consult  Post Acute Care Choice:    Choice offered to:     DME Arranged:    DME Agency:     HH Arranged:    HH Agency:     Status of Service:  In process, will continue to follow  Medicare Important Message Given:    Date Medicare IM Given:    Medicare IM give by:    Date Additional Medicare IM Given:    Additional Medicare Important Message give by:     If discussed at Long Length of Stay Meetings, dates discussed:    Additional CommentsGeni Bers:  Danuta Huseman, RN 07/23/2015, 4:11 PM

## 2015-07-23 NOTE — Evaluation (Signed)
Clinical/Bedside Swallow Evaluation Patient Details  Name: Archer Vise MRN: [id number] Date of Birth: 1936/07/22  Today's Date: 07/23/2015 Time: SLP Start Time (ACUTE ONLY): 1151 SLP Stop Time (ACUTE ONLY): 1240 SLP Time Calculation (min) (ACUTE ONLY): 49 min  Past Medical History:  Past Medical History  Diagnosis Date  . Dementia   . Anxiety   . MDD (major depressive disorder) (HCC)   . Hypertension   . Head ache   . Tremor   . Osteoarthritis   . Chronic kidney disease (CKD)    Past Surgical History:  Past Surgical History  Procedure Laterality Date  . Abdominal hysterectomy    . Hemorroidectomy    . Pterygium excision    . Foot surgery      right   HPI:  79 yo female adm to [facility] with h/o depression, bipolar d/o, depression, ? red man syndrome,  GERD, ? sepsis vs asp pna - Pt resides at [facility].  CXR indicated possible right lower lobe pna, reactive airway vs bronchitis.  Swallow evaluation ordered.    Assessment / Plan / Recommendation Clinical Impression  Pt presents with mild oral dysphagia consistent with dysphagia in patient's with cognitive deficits.  Delayed oral transiting noted across consistencies with oral pocketing of meat in right sulci.  Verbal cues to use lingual muscle to clear not effective, therefore icecream provided and effective.  No indications of aspiration or pharyngeal residuals noted.  CN exam unremarkable but pt's voice appears marginally weak.  Pt admits to h/o infrequent choking on liquids and on pills with liquids.  She also admits to choking episode prior to admission- however uncertain to ability to recall details given cognitive dx. Advised pt to aspiration precautions/compensations to mitigate risk using teach back .  Recommend pt continue dys3/thin diet with precautions.  SLP to follow up briefly for tolerance, instrumental evaluation indicated family education.     Aspiration Risk  Mild aspiration risk    Diet Recommendation Dysphagia  3 (Mech soft);Thin liquid   Liquid Administration via: Cup;Straw Medication Administration: Whole meds with puree Supervision: Staff to assist with self feeding Compensations: Slow rate;Small sips/bites;Lingual sweep for clearance of pocketing    Other  Recommendations Oral Care Recommendations: Oral care BID   Follow up Recommendations       Frequency and Duration min 1 x/week  1 week       Prognosis Prognosis for Safe Diet Advancement: Fair Barriers to Reach Goals: Cognitive deficits      Swallow Study   General Date of Onset: 07/23/15 HPI: 79 yo female adm to [facility] with h/o depression, bipolar d/o, depression, ? red man syndrome,  GERD, ? sepsis vs asp pna - Pt resides at [facility].  CXR indicated possible right lower lobe pna, reactive airway vs bronchitis.  Swallow evaluation ordered.  Type of Study: Bedside Swallow Evaluation Diet Prior to this Study: Dysphagia 3 (soft);Thin liquids Temperature Spikes Noted: No Respiratory Status: Room air History of Recent Intubation: No Behavior/Cognition: Alert;Cooperative;Pleasant mood Oral Cavity Assessment: Within Functional Limits Oral Care Completed by SLP: No Oral Cavity - Dentition: Adequate natural dentition Vision: Functional for self-feeding Self-Feeding Abilities: Needs assist (pt stated she could not hold her spoon or cup - requested slp to assist) Patient Positioning: Upright in bed Baseline Vocal Quality: Low vocal intensity Volitional Cough: Strong Volitional Swallow: Able to elicit    Oral/Motor/Sensory Function Overall Oral Motor/Sensory Function: Generalized oral weakness   Ice Chips Ice chips: Not tested   Thin Liquid Thin  Liquid: Impaired Presentation: Straw Oral Phase Functional Implications: Prolonged oral transit Pharyngeal  Phase Impairments: Suspected delayed Swallow Other Comments: delayed swallow    Nectar Thick Nectar Thick Liquid: Not tested   Honey Thick Honey Thick Liquid: Not tested   Puree  Puree: Impaired Presentation: Spoon Oral Phase Functional Implications: Prolonged oral transit Pharyngeal Phase Impairments: Suspected delayed Swallow   Solid Solid: Impaired Oral Phase Impairments: Other (comment);Impaired mastication;Reduced lingual movement/coordination Oral Phase Functional Implications: Right lateral sulci pocketing Pharyngeal Phase Impairments: Suspected delayed Fredric MareSwallow       Aleena Kirkeby, MS [address] [facility] SLP [phone]

## 2015-07-23 NOTE — NC FL2 (Deleted)
[place] MEDICAID FL2 LEVEL OF CARE SCREENING TOOL     IDENTIFICATION  Patient Name: [name] GaskinsCharlotte [name] Birthdate: 04-16-1936 Sex: female Admission Date (Current Location): 07/21/2015  [facility] [place]   Facility and Address:         Provider Number: [id number]  Attending Physician Name and Address:  Zannie CovePreetha Joseph, MD  Relative Name and Phone Number:       Current Level of Care: Hospital Recommended Level of Care: Skilled Nursing Facility Prior Approval Number:    Date Approved/Denied:   PASRR Number: [id number]  Discharge Plan: SNF    Current Diagnoses: Patient Active Problem List   Diagnosis Date Noted  . HCAP (healthcare-associated pneumonia) 07/21/2015  . Sepsis (HCC) 07/21/2015  . Acute kidney injury (HCC) 04/11/2015  . Renal insufficiency 04/11/2015  . Delirium 03/17/2015  . Polypharmacy 01/27/2015  . Dementia with behavioral disturbance 01/26/2015  . Hypokalemia 01/26/2015  . Pressure ulcer 01/25/2015  . Dehydration 01/24/2015  . UTI (lower urinary tract infection) 01/24/2015  . Metabolic encephalopathy 01/24/2015  . Hyponatremia 01/24/2015    Orientation RESPIRATION BLADDER Height & Weight     (Disoriented X 4)  Normal Incontinent 5\' 6"  (167.6 cm) 138 lbs.  BEHAVIORAL SYMPTOMS/MOOD NEUROLOGICAL BOWEL NUTRITION STATUS      Incontinent Diet (Low sodium, heart healthy)  AMBULATORY STATUS COMMUNICATION OF NEEDS Skin   Extensive Assist (Per PT, Spoke with pt's [facility] and [facility] who stated she was a [facility] and could self propel her WC.) Verbally Normal                       Personal Care Assistance Level of Assistance  Bathing, Dressing, Feeding Bathing Assistance: Limited assistance Feeding assistance: Limited assistance Dressing Assistance: Limited assistance     Functional Limitations Info             SPECIAL CARE FACTORS FREQUENCY  PT (By licensed PT), OT (By licensed OT)      PT Frequency: 5 X weekly OT Frequency: 5 X weekly             Contractures      Additional Factors Info  Code Status, Allergies, Isolation Precautions Code Status Info:  (FULL) Allergies Info: Abilify      Isolation Precautions Info: Extended spectrum beta lactamase     Current Medications (07/23/2015):  This is the current hospital active medication list Current Facility-Administered Medications  Medication Dose Route Frequency Provider Last Rate Last Dose  . 0.9 %  sodium chloride infusion   Intravenous Continuous Zannie CovePreetha Joseph, MD 75 mL/hr at 07/22/15 1951    . acetaminophen (TYLENOL) suppository 650 mg  650 mg Rectal Q6H PRN Alberteen Samhristopher P Danford, MD      . ceFEPIme (MAXIPIME) 2 g in dextrose 5 % 50 mL IVPB  2 g Intravenous Q12H Hessie KnowsJustin M Legge, RPH   2 g at 07/23/15 0950  . clonazePAM (KLONOPIN) tablet 0.5 mg  0.5 mg Oral BID PRN Zannie CovePreetha Joseph, MD   0.5 mg at 07/23/15 0511  . divalproex (DEPAKOTE SPRINKLE) capsule 250 mg  250 mg Oral BID Zannie CovePreetha Joseph, MD   250 mg at 07/23/15 1100  . enoxaparin (LOVENOX) injection 40 mg  40 mg Subcutaneous QHS Alberteen Samhristopher P Danford, MD   40 mg at 07/22/15 2213  . Influenza vac split quadrivalent PF (FLUARIX) injection 0.5 mL  0.5 mL Intramuscular Tomorrow-1000 Zannie CovePreetha Joseph, MD      .  pantoprazole (PROTONIX) injection 40 mg  40 mg Intravenous QHS Alberteen Sam, MD   40 mg at 07/22/15 2214  . pneumococcal 23 valent vaccine (PNU-IMMUNE) injection 0.5 mL  0.5 mL Intramuscular Tomorrow-1000 Zannie Cove, MD      . sodium chloride 0.9 % injection 3 mL  3 mL Intravenous Q12H Alberteen Sam, MD   3 mL at 07/22/15 2215  . vancomycin (VANCOCIN) 500 mg in sodium chloride 0.9 % 100 mL IVPB  500 mg Intravenous Q12H Hessie Knows, RPH   500 mg at 07/23/15 1101     Discharge Medications: Please see discharge summary for a list of discharge medications.  Relevant Imaging Results:  Relevant Lab Results:   Additional  Information SSN: [id number]  Adrian Blackwater, LCSW

## 2015-07-24 DIAGNOSIS — A419 Sepsis, unspecified organism: Secondary | ICD-10-CM | POA: Diagnosis not present

## 2015-07-24 DIAGNOSIS — R410 Disorientation, unspecified: Secondary | ICD-10-CM

## 2015-07-24 LAB — BASIC METABOLIC PANEL
Anion gap: 10 (ref 5–15)
BUN: 7 mg/dL (ref 6–20)
CHLORIDE: 105 mmol/L (ref 101–111)
CO2: 25 mmol/L (ref 22–32)
CREATININE: 0.48 mg/dL (ref 0.44–1.00)
Calcium: 9.7 mg/dL (ref 8.9–10.3)
GFR calc Af Amer: >60 mL/min (ref >60)
GFR calc non Af Amer: >60 mL/min (ref >60)
Glucose, Bld: 114 mg/dL — ABNORMAL HIGH (ref 65–99)
POTASSIUM: 3 mmol/L — AB (ref 3.5–5.1)
Sodium: 140 mmol/L (ref 135–145)

## 2015-07-24 LAB — CBC
HEMATOCRIT: 32.8 % — AB (ref 36.0–46.0)
HEMOGLOBIN: 10.6 g/dL — AB (ref 12.0–15.0)
MCH: 27.7 pg (ref 26.0–34.0)
MCHC: 32.3 g/dL (ref 30.0–36.0)
MCV: 85.9 fL (ref 78.0–100.0)
Platelets: 183 10*3/uL (ref 150–400)
RBC: 3.82 MIL/uL — AB (ref 3.87–5.11)
RDW: 13.2 % (ref 11.5–15.5)
WBC: 10.4 10*3/uL (ref 4.0–10.5)

## 2015-07-24 MED ORDER — AMOXICILLIN-POT CLAVULANATE 875-125 MG PO TABS
1.0000 | ORAL_TABLET | Freq: Two times a day (BID) | ORAL | Status: DC
  Administered 2015-07-24 – 2015-07-25 (×3): 1 via ORAL
  Filled 2015-07-24 (×3): qty 1

## 2015-07-24 MED ORDER — BENZTROPINE MESYLATE 0.5 MG PO TABS
0.5000 mg | ORAL_TABLET | Freq: Two times a day (BID) | ORAL | Status: DC
Start: 2015-07-24 — End: 2015-07-25
  Administered 2015-07-24 – 2015-07-25 (×3): 0.5 mg via ORAL
  Filled 2015-07-24 (×3): qty 1

## 2015-07-24 MED ORDER — POTASSIUM CHLORIDE CRYS ER 20 MEQ PO TBCR
40.0000 meq | EXTENDED_RELEASE_TABLET | Freq: Once | ORAL | Status: AC
  Administered 2015-07-24: 40 meq via ORAL
  Filled 2015-07-24: qty 2

## 2015-07-24 MED ORDER — PANTOPRAZOLE SODIUM 40 MG PO TBEC
40.0000 mg | DELAYED_RELEASE_TABLET | Freq: Every day | ORAL | Status: DC
  Administered 2015-07-24: 40 mg via ORAL
  Filled 2015-07-24 (×2): qty 1

## 2015-07-24 NOTE — Progress Notes (Signed)
[facility] PROGRESS NOTE  [name] GaskinsCharlotte [name] WUJ:[id number] DOB: Feb 06, 1936 DOA: 07/21/2015 PCP: Ron ParkerBOWEN,SAMUEL, MD  Assessment/Plan: 1. Sepsis/HCAP vs Aspiration pneumonia:  -improving, s/p 3days of IV Vancomycin and cefepime  -cultures negative, change to Augmentin -SLP eval completed, D3 diet recommended -lactic acid normal  2. AKI:  -pre-renal AKI, resolved  3. ?Red man syndrome:  -apparently had facial redness after vanc in ER. No respiratory difficulty. -no further reactions noted to Vanc, got this for 3days  4. Dementia with behavioral disturbance:  -less  agitated today,  -Held risperidone, continue Depakote, and clonazepam at lower dose  -resume cogentin at home dose - QTc <480  5. Bipolar d/o -psychotropic meds as above  6. Mild Dysphagia  -SLP eval completed, see above  7. GERD: -Continue PPI   Code Status: DNR, after d/w daughter today Family Communication: none at bedside, called and d/w daughter [name] [name] Disposition Plan: SNF ? Tomorrow if stable   Consultants:  SLP  HPI/Subjective: Feels ok, keep saying "im cold"  Objective: Filed Vitals:   07/23/15 2218 07/24/15 0543  BP: 130/60 159/67  Pulse: 73 86  Temp: 97.3 F (36.3 C) 98.3 F (36.8 C)  Resp: 20 20    Intake/Output Summary (Last 24 hours) at 07/24/15 1103 Last data filed at 07/24/15 0930  Gross per 24 hour  Intake   1545 ml  Output      0 ml  Net   1545 ml   Filed Weights   07/21/15 1610 07/21/15 2210  Weight: 61.236 kg (135 lb) 62.8 kg (138 lb 7.2 oz)    Exam:   General:  AAOx to self only, pleasantly confused, kept repeating same line  Cardiovascular: S1s2/RRR  Respiratory: ronchi at bases  Abdomen: Soft, NT, BS present  Musculoskeletal: trace edema   Data Reviewed: Basic Metabolic Panel:  Recent Labs Lab 07/21/15 1738 07/22/15 0515 07/23/15 0525 07/24/15 0606  NA 144 145 142 140  K 3.8 3.4* 3.1* 3.0*  CL 105 112* 110 105  CO2 26 24 23 25    GLUCOSE 116* 90 104* 114*  BUN 23* 15 9 7   CREATININE 1.14* 0.69 0.56 0.48  CALCIUM 10.4* 9.5 9.4 9.7   Liver Function Tests:  Recent Labs Lab 07/21/15 1738  AST 25  ALT 12*  ALKPHOS 52  BILITOT 0.5  PROT 7.1  ALBUMIN 3.6   No results for input(s): LIPASE, AMYLASE in the last 168 hours. No results for input(s): AMMONIA in the last 168 hours. CBC:  Recent Labs Lab 07/21/15 1738 07/22/15 0515 07/23/15 0525 07/24/15 0606  WBC 17.7* 14.2* 10.5 10.4  NEUTROABS 15.6*  --   --   --   HGB 12.3 10.7* 9.9* 10.6*  HCT 37.2 32.4* 30.8* 32.8*  MCV 89.6 88.3 87.5 85.9  PLT 186 153 165 183   Cardiac Enzymes: No results for input(s): CKTOTAL, CKMB, CKMBINDEX, TROPONINI in the last 168 hours. BNP (last 3 results) No results for input(s): BNP in the last 8760 hours.  ProBNP (last 3 results) No results for input(s): PROBNP in the last 8760 hours.  CBG: No results for input(s): GLUCAP in the last 168 hours.  Recent Results (from the past 240 hour(s))  Blood culture (routine x 2)     Status: None (Preliminary result)   Collection Time: 07/21/15  5:38 PM  Result Value Ref Range Status   Specimen Description LEFT ANTECUBITAL  Final   Special Requests BOTTLES DRAWN AEROBIC AND ANAEROBIC 5CC  Final   Culture  Final    NO GROWTH 3 DAYS Performed at [facility]    Report Status PENDING  Incomplete  Blood culture (routine x 2)     Status: None (Preliminary result)   Collection Time: 07/21/15  5:43 PM  Result Value Ref Range Status   Specimen Description BLOOD RIGHT ANTECUBITAL  Final   Special Requests IN PEDIATRIC BOTTLE 5CC  Final   Culture   Final    NO GROWTH 3 DAYS Performed at [facility]    Report Status PENDING  Incomplete  Urine culture     Status: None   Collection Time: 07/21/15  6:42 PM  Result Value Ref Range Status   Specimen Description URINE, CATHETERIZED  Final   Special Requests NONE  Final   Culture   Final    NO GROWTH 1  DAY Performed at [facility]    Report Status 07/23/2015 FINAL  Final     Studies: No results found.  Scheduled Meds: . ceFEPime (MAXIPIME) IV  2 g Intravenous Q12H  . divalproex  250 mg Oral BID  . enoxaparin (LOVENOX) injection  40 mg Subcutaneous QHS  . pantoprazole (PROTONIX) IV  40 mg Intravenous QHS  . sodium chloride  3 mL Intravenous Q12H  . vancomycin  500 mg Intravenous Q12H   Continuous Infusions: . sodium chloride 75 mL/hr at 07/22/15 1951   Antibiotics Given (last 72 hours)    Date/Time Action Medication Dose Rate   07/22/15 0505 Given   ceFEPIme (MAXIPIME) 2 g in dextrose 5 % 50 mL IVPB 2 g 100 mL/hr   07/22/15 1008 Given   vancomycin (VANCOCIN) 500 mg in sodium chloride 0.9 % 100 mL IVPB 500 mg 50 mL/hr   07/22/15 2210 Given   vancomycin (VANCOCIN) 500 mg in sodium chloride 0.9 % 100 mL IVPB 500 mg 50 mL/hr   07/22/15 2210 Given   ceFEPIme (MAXIPIME) 2 g in dextrose 5 % 50 mL IVPB 2 g 100 mL/hr   07/23/15 0950 Given   ceFEPIme (MAXIPIME) 2 g in dextrose 5 % 50 mL IVPB 2 g 100 mL/hr   07/23/15 1101 Given   vancomycin (VANCOCIN) 500 mg in sodium chloride 0.9 % 100 mL IVPB 500 mg 50 mL/hr   07/23/15 2327 Given   vancomycin (VANCOCIN) 500 mg in sodium chloride 0.9 % 100 mL IVPB 500 mg 50 mL/hr   07/23/15 2327 Given   ceFEPIme (MAXIPIME) 2 g in dextrose 5 % 50 mL IVPB 2 g 100 mL/hr   07/24/15 0949 Given   vancomycin (VANCOCIN) 500 mg in sodium chloride 0.9 % 100 mL IVPB 500 mg 50 mL/hr      Principal Problem:   HCAP (healthcare-associated pneumonia) Active Problems:   Dehydration   Dementia with behavioral disturbance   Polypharmacy   Delirium   Acute kidney injury (HCC)   Sepsis (HCC)    Time spent:    [facility]  [facility] Pager [phone]. If 7PM-7AM, please contact night-coverage at [email or web address], password [place] 07/24/2015, 11:03 AM  LOS: 3 days

## 2015-07-24 NOTE — Clinical Social Work Placement (Signed)
   CLINICAL SOCIAL WORK PLACEMENT  NOTE  Date:  07/24/2015  Patient Details  Name: [name] GaskinsCharlotte Puffenbarger MRN: [id number] Date of Birth: July 07, 1936  Clinical Social Work is seeking post-discharge placement for this patient at the Skilled  Nursing Facility level of care (*CSW will initial, date and re-position this form in  chart as items are completed):  No (pt daughter requesting search to [facility] and [facility])   Patient/family provided with [facility] Clinical Social Work Department's list of facilities offering this level of care within the geographic area requested by the patient (or if unable, by the patient's family).  Yes   Patient/family informed of their freedom to choose among providers that offer the needed level of care, that participate in Medicare, Medicaid or managed care program needed by the patient, have an available bed and are willing to accept the patient.  No   Patient/family informed of 's ownership interest in [facility] and [facility], as well as of the fact that they are under no obligation to receive care at these facilities.  PASRR submitted to EDS on       PASRR number received on       Existing PASRR number confirmed on 07/23/15     FL2 transmitted to all facilities in geographic area requested by pt/family on 07/23/15     FL2 transmitted to all facilities within larger geographic area on       Patient informed that his/her managed care company has contracts with or will negotiate with certain facilities, including the following:        Yes   Patient/family informed of bed offers received.  [facility]     Physician recommends and patient chooses bed at      [facility] to [facility] on  .  Patient to be transferred to facility by       Patient family notified on   of transfer.  Name of family member notified:        PHYSICIAN Please  sign FL2, Please sign DNR     Additional Comment:    _______________________________________________ [name], SUZANNA A, LCSW 07/24/2015, 12:09 PM

## 2015-07-24 NOTE — Progress Notes (Signed)
CSW continuing to follow.   CSW received notification from [facility] that facility can accept pt upon discharge. [facility] plans to admit pt to skilled hallway until pt can be without isolation to transition to memory care unit [facility]   CSW contacted pt daughter, Bonita QuinLinda via telephone and notified pt daughter of bed offer [facility]. Pt daughter very grateful that facility can offer pt placement and wants to accept bed offer.  Per MD, pt not yet medically ready today and anticipate discharge tomorrow.   CSW [facility] of acceptance of bed offer and anticipation for discharge tomorrow.  CSW to continue to follow to provide support and assist with pt discharge to [facility] when medically ready.  Loletta SpecterSuzanna Sabastian Raimondi, MSW, LCSW Clinical Social Work [phone]

## 2015-07-24 NOTE — Evaluation (Signed)
Physical Therapy Evaluation Patient Details Name: [name] [name] MRN: [id number] DOB: 09/24/1935 Today's Date: 07/24/2015   History of Present Illness  Patient is a 79 yo female admitted 07/21/15 from SNF with PMH:  advanced dementia, depression, anxiety, HTN, tremor, OA who presents with confusion and fever and admitted for HCAP  Clinical Impression  Pt admitted with above diagnosis. Pt currently with functional limitations due to the deficits listed below (see PT Problem List).  Pt will benefit from skilled PT to increase their independence and safety with mobility to allow discharge to the venue listed below.  Pt assisted OOB to recliner and currently mod-max assist for mobility.     Follow Up Recommendations SNF;Supervision/Assistance - 24 hour    Equipment Recommendations  None recommended by PT    Recommendations for Other Services       Precautions / Restrictions Precautions Precautions: Fall      Mobility  Bed Mobility Overal bed mobility: Needs Assistance;+2 for physical assistance Bed Mobility: Supine to Sit     Supine to sit: Max assist;+2 for physical assistance     General bed mobility comments: verbal cues for intiating movement however pt required increased assist  Transfers Overall transfer level: Needs assistance Equipment used: 2 person hand held assist Transfers: Sit to/from Stand;Stand Pivot Transfers Sit to Stand: Mod assist;+2 physical assistance Stand pivot transfers: Mod assist;+2 physical assistance       General transfer comment: verbal cues for safe technique, performed one sit to stand, pt fearful of R hip pain however stated none upon standing, assisted to recliner  Ambulation/Gait                Stairs            Wheelchair Mobility    Modified Rankin (Stroke Patients Only)       Balance                                             Pertinent Vitals/Pain Pain Assessment: Faces Faces Pain  Scale: Hurts even more Pain Location: R hip Pain Descriptors / Indicators: Sore;Discomfort Pain Intervention(s): Limited activity within patient's tolerance;Monitored during session;Repositioned    Home Living Family/patient expects to be discharged to:: Skilled nursing facility                      Prior Function Level of Independence: Needs assistance   Gait / Transfers Assistance Needed: per chart, was transferring to w/c with +1 assist           Hand Dominance        Extremity/Trunk Assessment   Upper Extremity Assessment: Difficult to assess due to impaired cognition           Lower Extremity Assessment: Generalized weakness;Difficult to assess due to impaired cognition         Communication   Communication: No difficulties  Cognition Arousal/Alertness: Awake/alert Behavior During Therapy: Flat affect Overall Cognitive Status: No family/caregiver present to determine baseline cognitive functioning (hx of advanced dementia)                      General Comments      Exercises        Assessment/Plan    PT Assessment Patient needs continued PT services  PT Diagnosis Generalized weakness   PT Problem List Decreased strength;Decreased activity  tolerance;Decreased mobility  PT Treatment Interventions DME instruction;Patient/family education;Functional mobility training;Therapeutic activities;Wheelchair mobility training;Therapeutic exercise;Balance training   PT Goals (Current goals can be found in the Care Plan section) Acute Rehab PT Goals PT Goal Formulation: Patient unable to participate in goal setting Time For Goal Achievement: 07/31/15 Potential to Achieve Goals: Fair    Frequency Min 2X/week   Barriers to discharge        Co-evaluation               End of Session   Activity Tolerance: Patient tolerated treatment well Patient left: in chair;with call bell/phone within reach Nurse Communication: Mobility status (NT  assisted with transfer, RN into room end of session)         Time: [id number] PT Time Calculation (min) (ACUTE ONLY): 14 min   Charges:   PT Evaluation $Initial PT Evaluation Tier I: 1 Procedure     PT G Codes:        Korben Carcione,KATHrine E 07/24/2015, 12:07 PM Zenovia JarredKati Joda Braatz, PT, DPT 07/24/2015 Pager: [phone]

## 2015-07-24 NOTE — Progress Notes (Signed)
Speech Language Pathology Treatment: Dysphagia  Patient Details Name: [name] [name] MRN: [id number] DOB: 1935-09-01 Today's Date: 07/24/2015 Time: 1031-2811 SLP Time Calculation (min) (ACUTE ONLY): 25 min  Assessment / Plan / Recommendation Clinical Impression  Patient seen for assessment of po tolerance, indication for instrumental swallow evaluation.  Pt sitting upright in bed - poor intake noted today.  Pt states "things don't settle right" but did not expound on information.   Per RN, pt consumed only a few bites of lunch.  Pt agreeable to consume ice cream, Ensure and soda via cup/spoon/straw today.  Pt dislikes taste of Ensure and states her appetite is poor today.    Mildly delayed swallow initiation noted across all consisencies.  Post-swallow cough x1 with sequential straw boluses worrisome for airway protection.  Small single boluses tolerated well however pt needed max cues to conduct strategy.  Due to pt ongoing weakness, SLP recommends to continue dys3/thin diet due to weakness with full supervision - encouraging po intake.   SLP to sign off as pt appears to be tolerating limited po intake she will consume.       HPI HPI: 79 yo female adm to [facility] with h/o depression, bipolar d/o, depression, ? red man syndrome,  GERD, ? sepsis vs asp pna - Pt resides at [facility].  CXR indicated possible right lower lobe pna, reactive airway vs bronchitis.  Swallow evaluation ordered.       SLP Plan  All goals met     Recommendations  Diet recommendations: Dysphagia 3 (mechanical soft);Thin liquid Liquids provided via: Cup;Straw Medication Administration: Whole meds with puree Supervision: Full supervision/cueing for compensatory strategies Compensations: Slow rate;Small sips/bites;Lingual sweep for clearance of pocketing Postural Changes and/or Swallow Maneuvers: Seated upright 90 degrees;Upright 30-60 min after meal              Oral Care Recommendations: Oral care BID Follow up  Recommendations: None Plan: All goals met   Luanna Salk, [facility] SLP [phone]

## 2015-07-25 LAB — BASIC METABOLIC PANEL
Anion gap: 11 (ref 5–15)
BUN: 8 mg/dL (ref 6–20)
CALCIUM: 9.7 mg/dL (ref 8.9–10.3)
CO2: 26 mmol/L (ref 22–32)
CREATININE: 0.43 mg/dL — AB (ref 0.44–1.00)
Chloride: 103 mmol/L (ref 101–111)
GFR calc Af Amer: >60 mL/min (ref >60)
GLUCOSE: 111 mg/dL — AB (ref 65–99)
Potassium: 3.1 mmol/L — ABNORMAL LOW (ref 3.5–5.1)
SODIUM: 140 mmol/L (ref 135–145)

## 2015-07-25 LAB — CBC
HCT: 33.4 % — ABNORMAL LOW (ref 36.0–46.0)
Hemoglobin: 11 g/dL — ABNORMAL LOW (ref 12.0–15.0)
MCH: 28.1 pg (ref 26.0–34.0)
MCHC: 32.9 g/dL (ref 30.0–36.0)
MCV: 85.2 fL (ref 78.0–100.0)
PLATELETS: 215 10*3/uL (ref 150–400)
RBC: 3.92 MIL/uL (ref 3.87–5.11)
RDW: 13.1 % (ref 11.5–15.5)
WBC: 9 10*3/uL (ref 4.0–10.5)

## 2015-07-25 MED ORDER — AMOXICILLIN-POT CLAVULANATE 875-125 MG PO TABS: 1.0000 | ORAL_TABLET | Freq: Two times a day (BID) | ORAL | Status: AC

## 2015-07-25 MED ORDER — CLONAZEPAM 0.5 MG PO TABS: 0.5000 mg | ORAL_TABLET | Freq: Three times a day (TID) | ORAL | Status: AC | PRN

## 2015-07-25 MED ORDER — RISPERIDONE 1 MG PO TABS: 1.0000 mg | ORAL_TABLET | Freq: Every day | ORAL | Status: AC

## 2015-07-25 NOTE — Clinical Social Work Placement (Signed)
Patient is set to discharge [facility] today. Patient & daughter aware. Discharge packet given to RN, Nehemiah SettleBrooke. [facility] called for transport.     [name] MaxinKelly Dhana Totton, LCSW [facility] Clinical Social Worker cell #: [phone]    CLINICAL SOCIAL WORK PLACEMENT  NOTE  Date:  07/25/2015  Patient Details  Name: [name] [name] MRN: [id number] Date of Birth: 02/02/1936  Clinical Social Work is seeking post-discharge placement for this patient at the Skilled  Nursing Facility level of care (*CSW will initial, date and re-position this form in  chart as items are completed):  No (pt daughter requesting search to [facility] and [facility])   Patient/family provided with [facility] Clinical Social Work Department's list of facilities offering this level of care within the geographic area requested by the patient (or if unable, by the patient's family).  Yes   Patient/family informed of their freedom to choose among providers that offer the needed level of care, that participate in Medicare, Medicaid or managed care program needed by the patient, have an available bed and are willing to accept the patient.  No   Patient/family informed of [facility]'s ownership interest in [facility] and [facility], as well as of the fact that they are under no obligation to receive care at these facilities.  PASRR submitted to EDS on       PASRR number received on       Existing PASRR number confirmed on 07/23/15     FL2 transmitted to all facilities in geographic area requested by pt/family on 07/23/15     FL2 transmitted to all facilities within larger geographic area on       Patient informed that his/her managed care company has contracts with or will negotiate with certain facilities, including the following:        Yes   Patient/family informed of bed offers received.  [facility]     Physician  recommends and patient chooses bed at      [facility].  Patient to be transferred to facility by [facility]     Patient family notified on 07/25/15 of transfer.  Name of family member notified:  patient's daughter     PHYSICIAN       Additional Comment:    _______________________________________________ Arlyss RepressHarrison, Cordero Surette F, LCSW 07/25/2015, 1:09 PM

## 2015-07-25 NOTE — Care Management Important Message (Signed)
Important Message  Patient Details IM Letter given to Cookie/Case Manager to present to PatientImportant Message  Patient Details  Name: [name] GaskinsCharlotte Moncur MRN: [id number] Date of Birth: September 13, 1935   Medicare Important Message Given:  Yes    Haskell FlirtJamison, Harrietta Incorvaia 07/25/2015, 12:23 PM Name: [name] GaskinsCharlotte Broshears MRN: [id number] Date of Birth: September 13, 1935   Medicare Important Message Given:  Yes    Haskell FlirtJamison, Evander Macaraeg 07/25/2015, 12:22 PM

## 2015-07-25 NOTE — Discharge Summary (Signed)
Physician Discharge Summary  [name] [name] WUJ:[id number] DOB: 07-27-1936 DOA: 07/21/2015  PCP: [name], MD  Admit date: 07/21/2015 Discharge date: 07/25/2015  Time spent: 45 minutes  Recommendations for Outpatient Follow-up:  1. PCP at SNF in 1 week, needs ongoing discussions regarding goals of care   Discharge Diagnoses:  Principal Problem:   HCAP (healthcare-associated pneumonia) Active Problems:   Dehydration   Dementia with behavioral disturbance   Polypharmacy   Delirium   Acute kidney injury (HCC)   Sepsis (HCC)   Discharge Condition: stable  Diet recommendation: Dysphagia 3 with thin liquids  Filed Weights   07/21/15 1610 07/21/15 2210  Weight: 61.236 kg (135 lb) 62.8 kg (138 lb 7.2 oz)    History of present illness:  Chief Complaint: Fever, cough and altered mental status HPI: [name] [name] is a 79 y.o. female with a past medical history significant for advanced dementia with behavioral disturbance/Bipolar d/o who presented with confusion and fever from NH.  Patient was unable to provide history due to dementia and altered mental status. Last night, nursing home called daughter to report the patient required supplemental oxygen and was coughing and would be started on azithromycin. When  daughter arrived at the nursing home finding the patient confused, intermittently lethargic, complaining of pain "all over", and low blood pressure per nursing. She had low-grade fever and cough and so was transported to the ER.  Hospital Course:  1. Sepsis/HCAP vs Aspiration pneumonia:  -improving, s/p 3days of IV Vancomycin and cefepime  -cultures negative, changed to Augmentin -SLP eval completed, D3 diet recommended -lactic acid normal -clinically stable and improved  2. AKI:  -pre-renal AKI, resolved  3. ?Red man syndrome:  -apparently had facial redness after vanc in ER. No respiratory difficulty. -no further reactions noted to Vanc, got this for  3days  4. Dementia with behavioral disturbance:  -home meds resumed at lower dose due to sedation  -resumed risperidone at lower dose, continue Depakote, and clonazepam at lower dose  -resume cogentin at home dose - QTc <480  5. Bipolar d/o -psychotropic meds as above  6. Mild Dysphagia  -SLP eval completed, see above  7. GERD: -Continue PPI  Discharge Exam: Filed Vitals:   07/24/15 2020 07/25/15 0500  BP: 132/66 148/60  Pulse: 87 88  Temp: 99.1 F (37.3 C) 98.6 F (37 C)  Resp: 18 18    General: AAOx3 Cardiovascular: S1S2/RRR Respiratory:CTAB  Discharge Instructions   Discharge Instructions    Discharge instructions    Complete by:  As directed   Dysphagia 3 diet with thin liquids     Increase activity slowly    Complete by:  As directed           Current Discharge Medication List    START taking these medications   Details  amoxicillin-clavulanate (AUGMENTIN) 875-125 MG tablet Take 1 tablet by mouth every 12 (twelve) hours. For 3days Qty: 6 tablet, Refills: 0      CONTINUE these medications which have CHANGED   Details  clonazePAM (KLONOPIN) 0.5 MG tablet Take 1 tablet (0.5 mg total) by mouth 3 (three) times daily as needed for anxiety. Qty: 30 tablet, Refills: 0    risperiDONE (RISPERDAL) 1 MG tablet Take 1 tablet (1 mg total) by mouth at bedtime. Refills: 0      CONTINUE these medications which have NOT CHANGED   Details  acetaminophen (TYLENOL) 500 MG tablet Take 500 mg by mouth every 6 (six) hours as needed for moderate pain.  alendronate (FOSAMAX) 70 MG tablet Take 1 tablet (70 mg total) by mouth once a week. Take with a full glass of water on an empty stomach. Takes on Mondays    benztropine (COGENTIN) 0.5 MG tablet Take 1 tablet (0.5 mg total) by mouth 2 (two) times daily. Qty: 30 tablet, Refills: 0    cycloSPORINE (RESTASIS) 0.05 % ophthalmic emulsion Place 1 drop into both eyes 2 (two) times daily. Qty: 0.4 mL    divalproex  (DEPAKOTE SPRINKLE) 125 MG capsule Take 250 mg by mouth 2 (two) times daily. 250mg  at 9am and 500mg  at 9pm    docusate sodium (COLACE) 100 MG capsule Take 100 mg by mouth 2 (two) times daily.    Melatonin 3 MG TABS Take 1 tablet by mouth at bedtime.    Multiple Vitamins-Minerals (CERTA-VITE PO) Take 1 tablet by mouth daily.    omeprazole (PRILOSEC) 20 MG capsule Take 1 capsule (20 mg total) by mouth daily after breakfast.    rivastigmine (EXELON) 1.5 MG capsule Take 1.5 mg by mouth 2 (two) times daily.      STOP taking these medications     busPIRone (BUSPAR) 15 MG tablet        Allergies  Allergen Reactions  . Abilify [Aripiprazole] Other (See Comments)    Caused pt to not be able to walk and was unable to care for herself    Follow-up Information    Follow up with [facility].   Specialty:  Skilled Nursing Facility   Contact information:   [address] [place] [id number] [phone]      Follow up with Ron ParkerBOWEN,SAMUEL, MD. Schedule an appointment as soon as possible for a visit in 1 week.   Specialty:  Internal Medicine       The results of significant diagnostics from this hospitalization (including imaging, microbiology, ancillary and laboratory) are listed below for reference.    Significant Diagnostic Studies: Dg Chest 2 View  07/21/2015  CLINICAL DATA:  Cough. EXAM: CHEST  2 VIEW COMPARISON:  04/04/2015 FINDINGS: Cardiomediastinal silhouette is normal. Mediastinal contours appear intact. There is no evidence of focal airspace consolidation, pleural effusion or pneumothorax. There is peribronchovascular thickening with lower lobe predominance which may be seen with bronchitis or reactive airway disease. More focal subtle airspace consolidation is seen in the right lower lobe. Osseous structures are without acute abnormality. Soft tissues are grossly normal. IMPRESSION: Findings compatible with acute bronchitis or reactive airway  disease. Subtle focal airspace consolidation in the right lower lobe may represent superimposed developing pneumonia. Electronically Signed   By: Ted Mcalpineobrinka  Dimitrova M.D.   On: 07/21/2015 16:28   Ct Head Wo Contrast  07/21/2015  CLINICAL DATA:  Decreased mental status. Recently started on antibiotics for cough. History of dementia. EXAM: CT HEAD WITHOUT CONTRAST TECHNIQUE: Contiguous axial images were obtained from the base of the skull through the vertex without intravenous contrast. COMPARISON:  Head CT 04/11/2015. FINDINGS: There is no evidence of acute intracranial hemorrhage, mass lesion, brain edema or extra-axial fluid collection. The ventricles and subarachnoid spaces are prominent but stable. There is no CT evidence of acute cortical infarction. There is mild chronic periventricular white matter disease, likely due to chronic small vessel ischemic changes. Right nasal cannula in place. The visualized paranasal sinuses, mastoid air cells and middle ears are clear. The calvarium is intact. IMPRESSION: No acute intracranial findings. Stable atrophy and chronic small vessel ischemic changes. Electronically Signed   By: Hilarie FredricksonWilliam  Veazey M.D.  On: 07/21/2015 17:30   Dg Knee Complete 4 Views Right  07/21/2015  CLINICAL DATA:  pt with dementia, tech was not able to obtain hx from pt; RN's note : Per EMS. Pt from [facility] nursing home. Hx dementia. Family called EMS due to decreased mental status. Pt was able to leave facility to go out to lunch a .*comment was truncated* EXAM: RIGHT KNEE - COMPLETE 4+ VIEW COMPARISON:  None. FINDINGS: Mild diffuse osteopenia. There is no evidence of fracture, dislocation, or joint effusion. There is no evidence of arthropathy or other focal bone abnormality. Soft tissues are unremarkable. IMPRESSION: Negative. Electronically Signed   By: Corlis Leak M.D.   On: 07/21/2015 17:18   Dg Hip Unilat With Pelvis 2-3 Views Right  07/21/2015  CLINICAL DATA:  pt with dementia,  tech was not able to obtain hx from pt; RN's note : Per EMS. Pt from [facility] nursing home. Hx dementia. Family called EMS due to decreased mental status. Pt was able to leave facility to go out to lunch a .*comment was truncated* EXAM: DG HIP (WITH OR WITHOUT PELVIS) 2-3V RIGHT COMPARISON:  CT 04/11/2015 FINDINGS: There is no evidence of hip fracture or dislocation. There is no evidence of arthropathy or other focal bone abnormality. Degenerative disc disease L5-S1 incidentally noted. IMPRESSION: 1. Negative right hip. 2. L5-S1 degenerative disc disease. Electronically Signed   By: Corlis Leak M.D.   On: 07/21/2015 17:17    Microbiology: Recent Results (from the past 240 hour(s))  Blood culture (routine x 2)     Status: None (Preliminary result)   Collection Time: 07/21/15  5:38 PM  Result Value Ref Range Status   Specimen Description LEFT ANTECUBITAL  Final   Special Requests BOTTLES DRAWN AEROBIC AND ANAEROBIC 5CC  Final   Culture   Final    NO GROWTH 3 DAYS Performed at [facility]    Report Status PENDING  Incomplete  Blood culture (routine x 2)     Status: None (Preliminary result)   Collection Time: 07/21/15  5:43 PM  Result Value Ref Range Status   Specimen Description BLOOD RIGHT ANTECUBITAL  Final   Special Requests IN PEDIATRIC BOTTLE 5CC  Final   Culture   Final    NO GROWTH 3 DAYS Performed at [facility]    Report Status PENDING  Incomplete  Urine culture     Status: None   Collection Time: 07/21/15  6:42 PM  Result Value Ref Range Status   Specimen Description URINE, CATHETERIZED  Final   Special Requests NONE  Final   Culture   Final    NO GROWTH 1 DAY Performed at [facility]    Report Status 07/23/2015 FINAL  Final     Labs: Basic Metabolic Panel:  Recent Labs Lab 07/21/15 1738 07/22/15 0515 07/23/15 0525 07/24/15 0606 07/25/15 0507  NA 144 145 142 140 140  K 3.8 3.4* 3.1* 3.0* 3.1*  CL 105 112* 110 105 103  CO2 GLUCOSE 116* 90 104* 114* 111*  BUN 23* CREATININE 1.14* 0.69 0.56 0.48 0.43*  CALCIUM 10.4* 9.5 9.4 9.7 9.7   Liver Function Tests:  Recent Labs Lab 07/21/15 1738  AST 25  ALT 12*  ALKPHOS 52  BILITOT 0.5  PROT 7.1  ALBUMIN 3.6   No results for input(s): LIPASE, AMYLASE in the last 168 hours. No results for input(s): AMMONIA in the  last 168 hours. CBC:  Recent Labs Lab 07/21/15 1738 07/22/15 0515 07/23/15 0525 07/24/15 0606 07/25/15 0507  WBC 17.7* 14.2* 10.5 10.4 9.0  NEUTROABS 15.6*  --   --   --   --   HGB 12.3 10.7* 9.9* 10.6* 11.0*  HCT 37.2 32.4* 30.8* 32.8* 33.4*  MCV 89.6 88.3 87.5 85.9 85.2  PLT 186 153 165 183 215   Cardiac Enzymes: No results for input(s): CKTOTAL, CKMB, CKMBINDEX, TROPONINI in the last 168 hours. BNP: BNP (last 3 results) No results for input(s): BNP in the last 8760 hours.  ProBNP (last 3 results) No results for input(s): PROBNP in the last 8760 hours.  CBG: No results for input(s): GLUCAP in the last 168 hours.     SignedZannie Cove MD   [facility] 07/25/2015, 12:37 PM

## 2015-07-25 NOTE — Progress Notes (Signed)
[facility].  Earnest ConroyBrooke M. Clelia CroftShaw, RN

## 2015-07-26 LAB — CULTURE, BLOOD (ROUTINE X 2)
CULTURE: NO GROWTH
Culture: NO GROWTH

## 2017-01-23 IMAGING — CR DG HIP (WITH OR WITHOUT PELVIS) 2-3V*L*
3 series · 3 of 3 positions shown · non-contrast
Comparison: None.

CLINICAL DATA: Recent fall with left hip pain

EXAM:
LEFT HIP (WITH PELVIS) 2-3 VIEWS

[t pelvis ap]
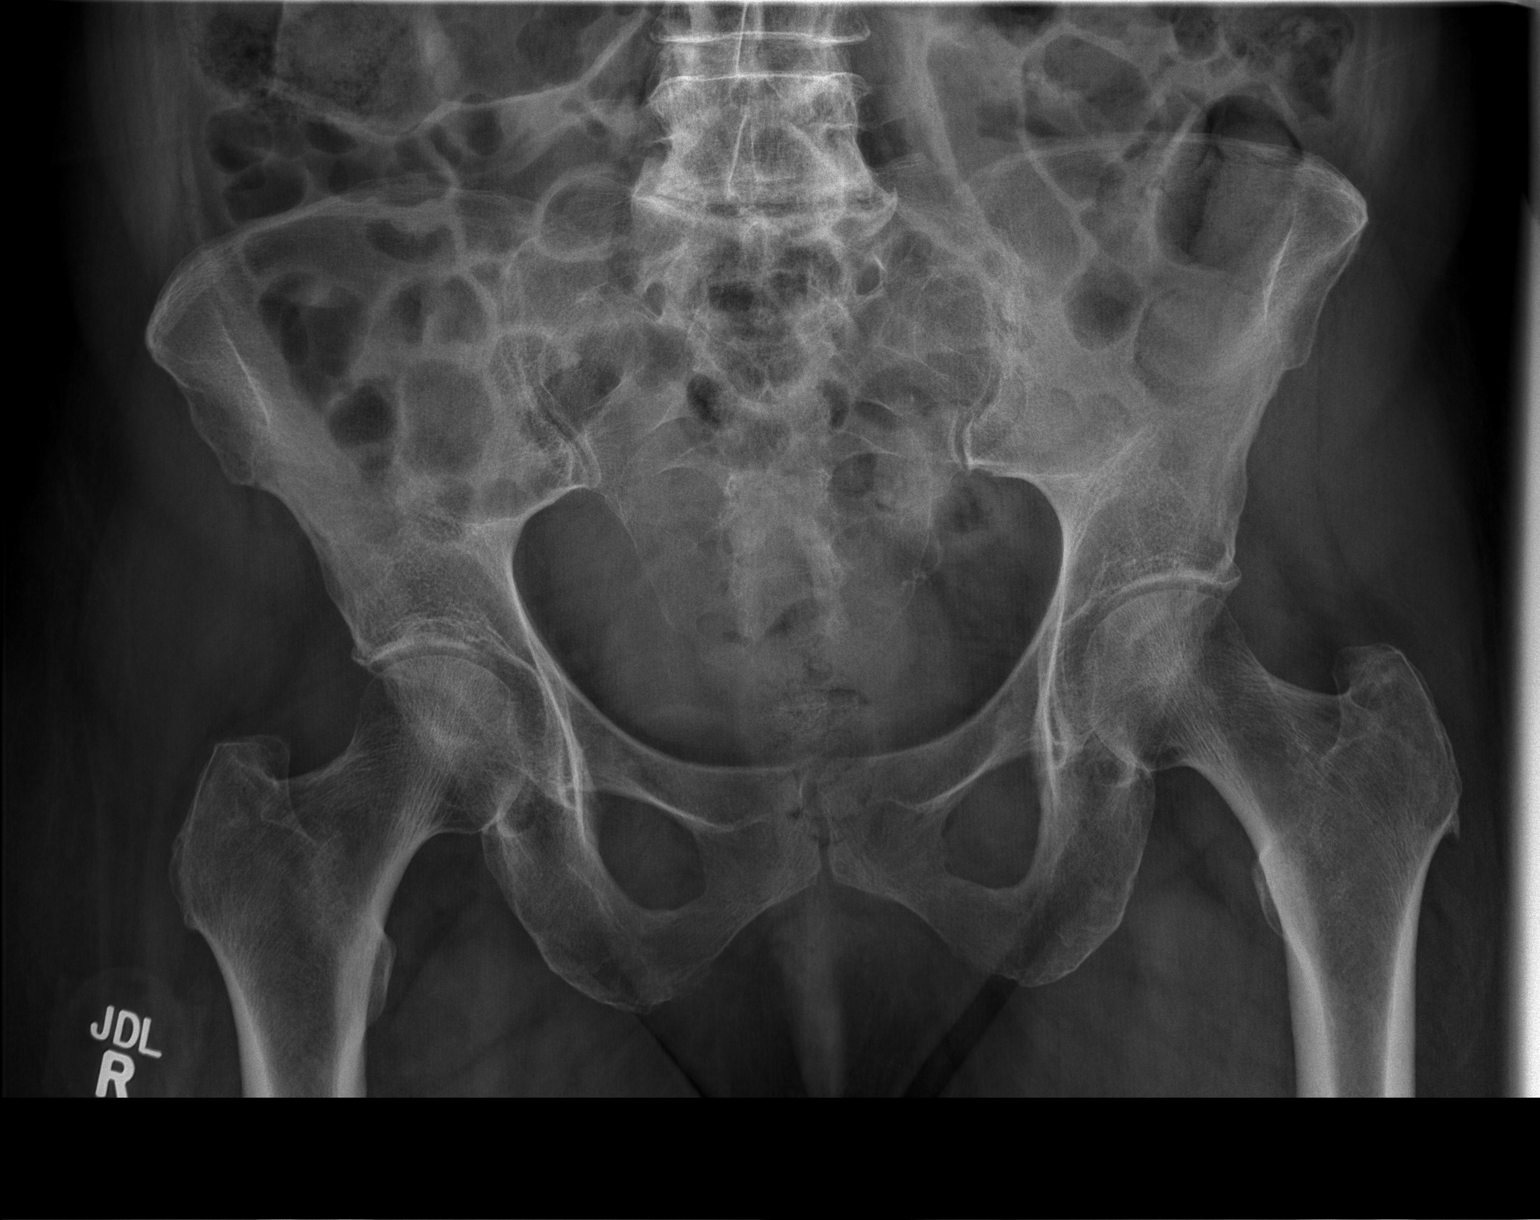

[t hip ap left]
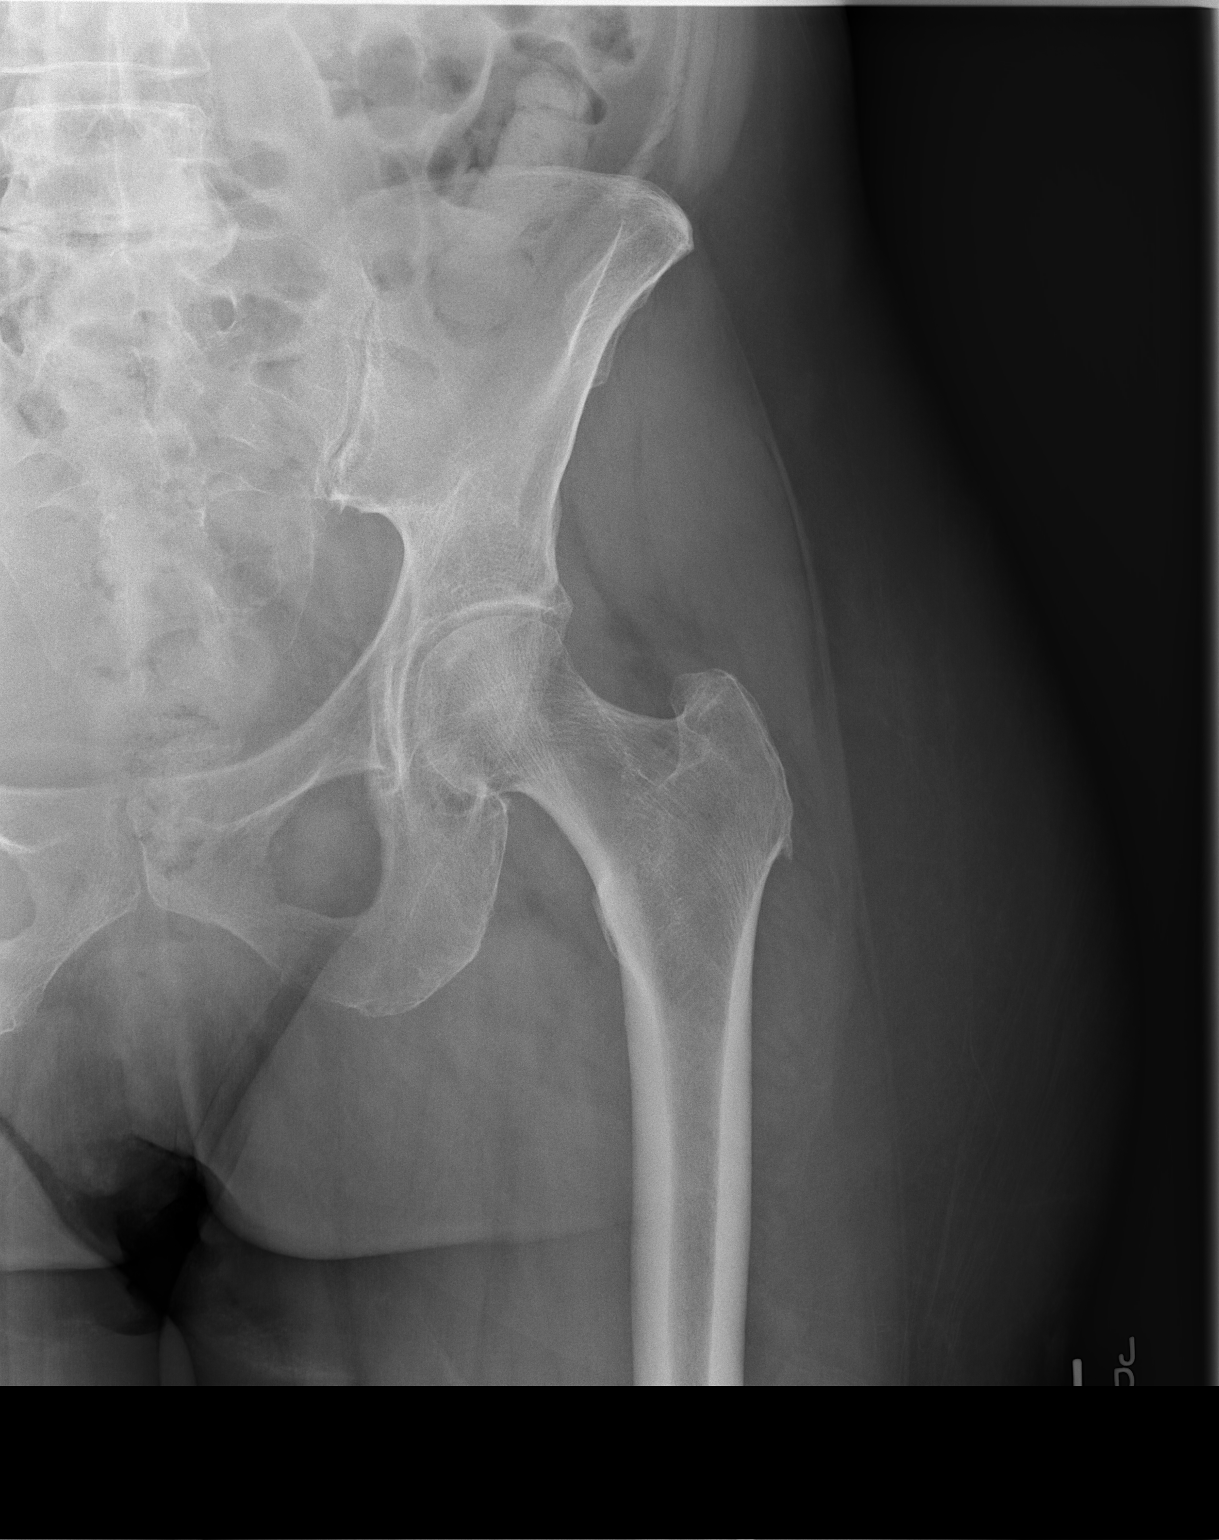

[t hip frog leg left]
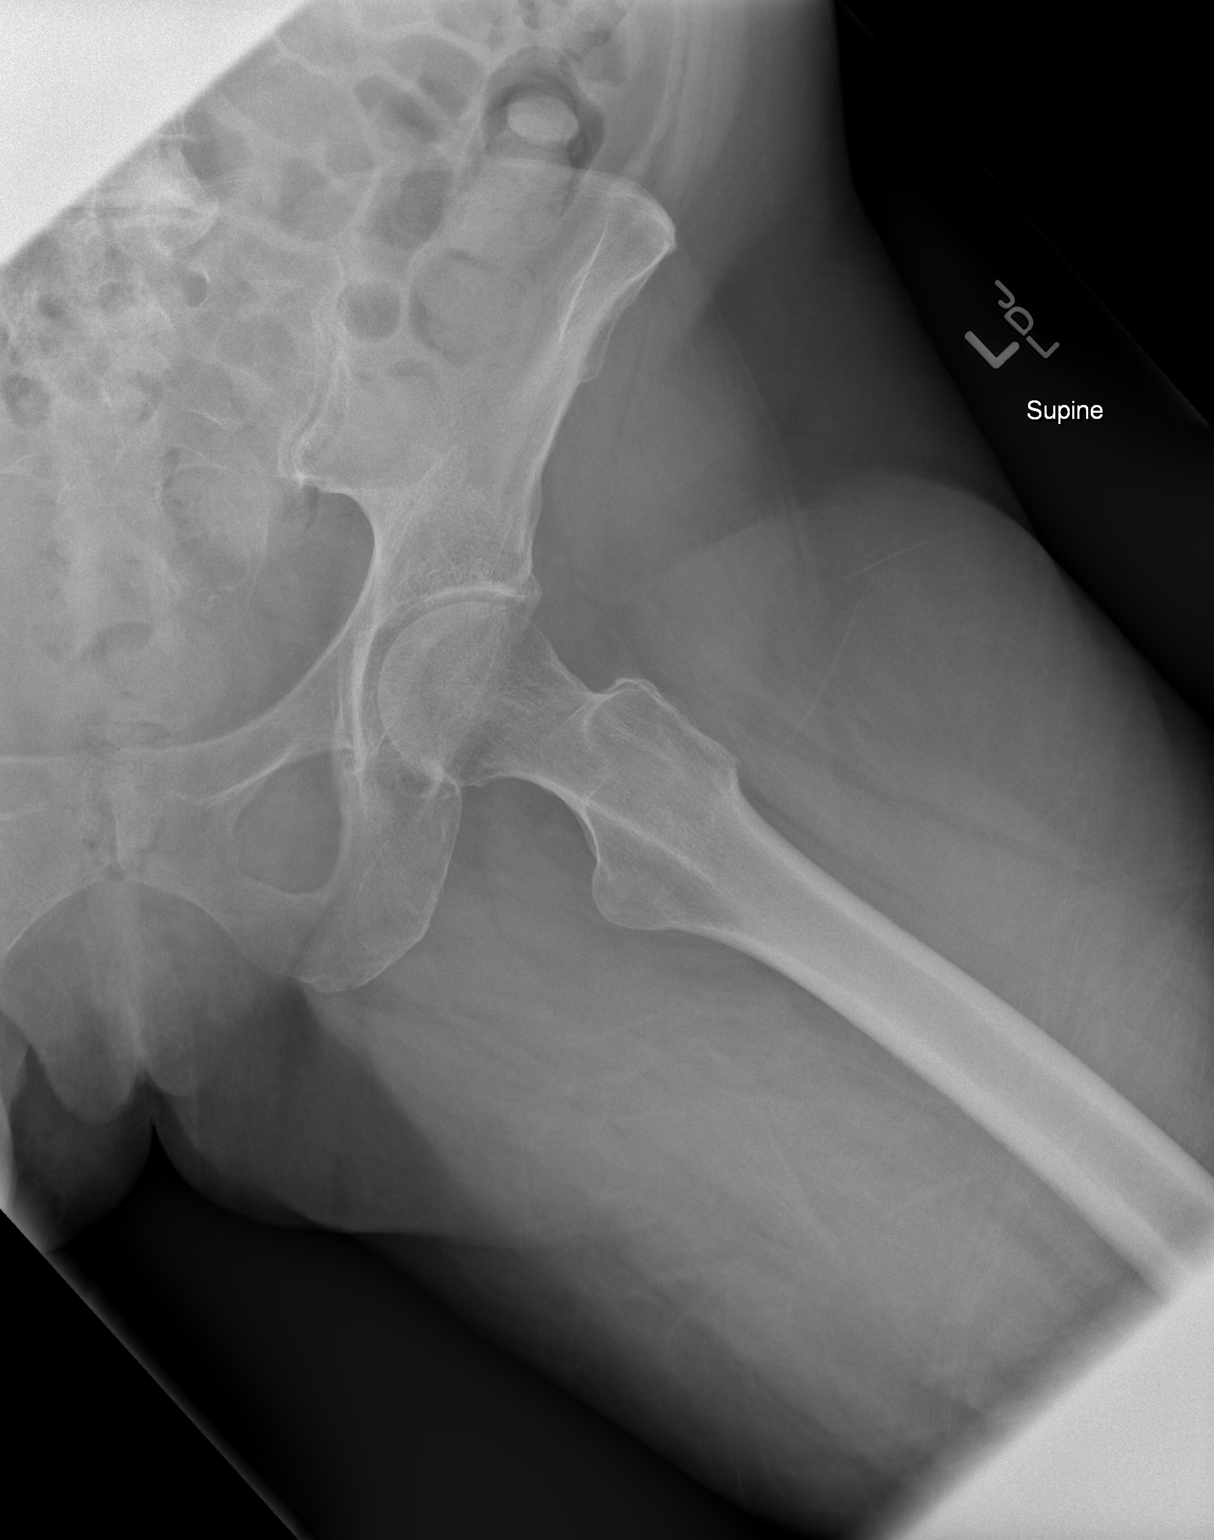

[3 of 3 positions shown; findings below may reference images not displayed]

FINDINGS: There is no evidence of hip fracture or dislocation. There is no
evidence of arthropathy or other focal bone abnormality.
IMPRESSION: No acute abnormality noted.

## 2017-01-25 IMAGING — CR DG CHEST 2V
2 series · 2 of 2 positions shown · non-contrast
Comparison: August 24, 2009

CLINICAL DATA: Productive cough and fatigue

EXAM:
CHEST  2 VIEW

[w chest lat]
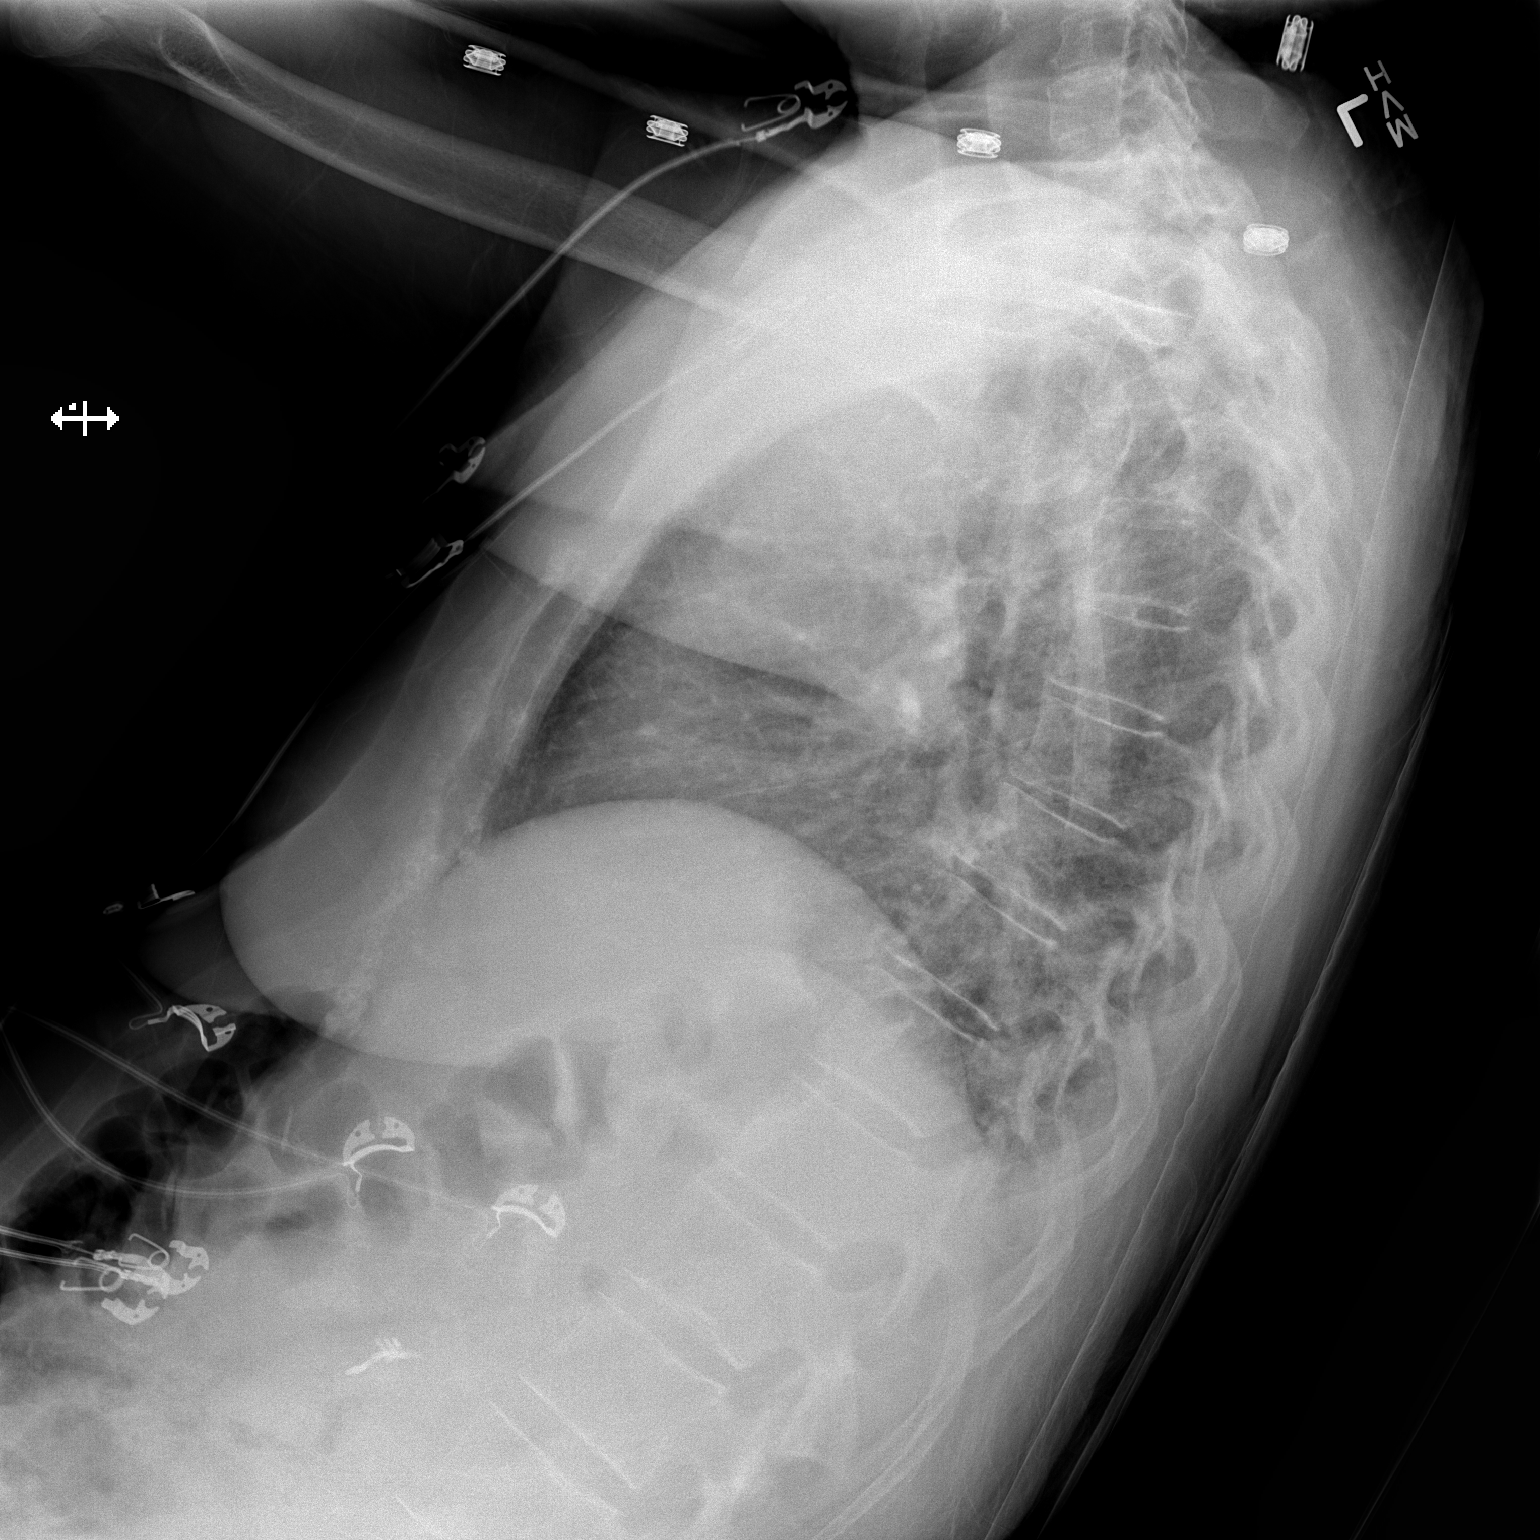

[x chest ap]
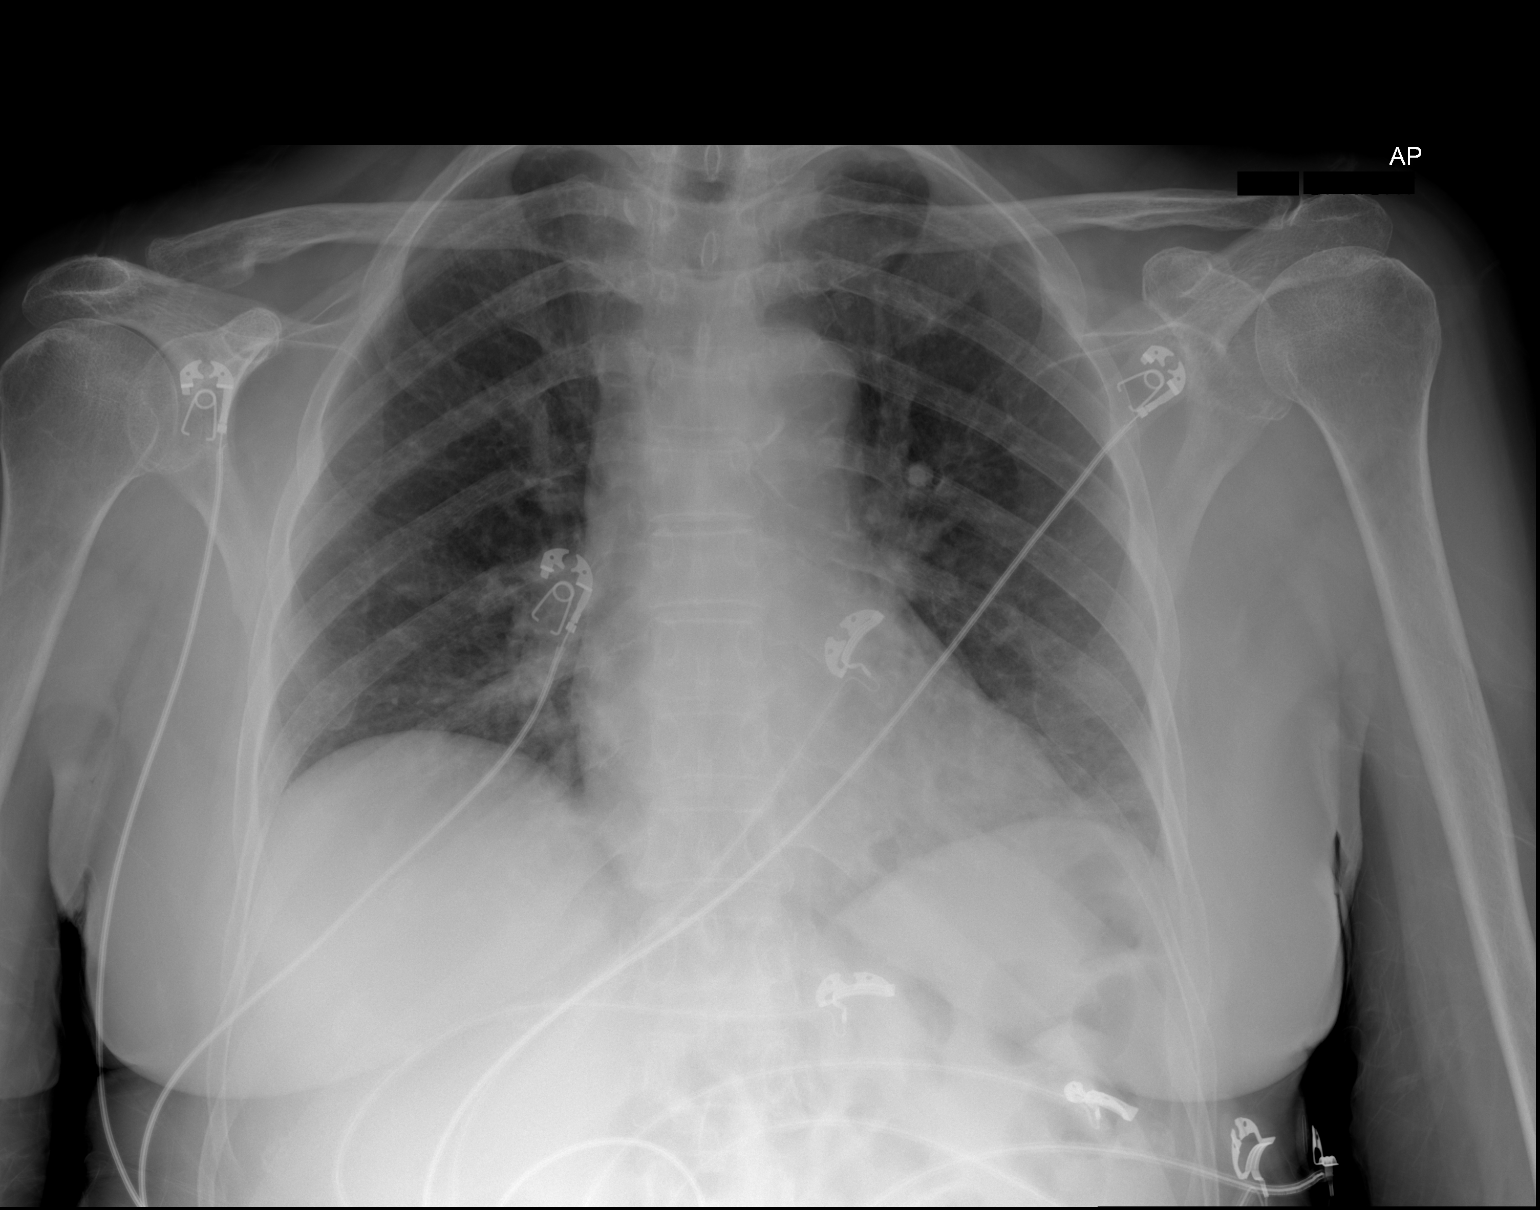

[2 of 2 positions shown; findings below may reference images not displayed]

FINDINGS: There is mild atelectasis in the left base. Lungs elsewhere clear.
Heart size and pulmonary vascularity are normal. No adenopathy. No
bone lesions.
IMPRESSION: Mild atelectasis left base.  Lungs elsewhere clear.

## 2017-05-28 DEATH — deceased
